# Patient Record
Sex: Male | Born: 1950
Health system: Southern US, Community
[De-identification: ages and names within clinical notes are randomized; demographics above are authoritative.]

## PROBLEM LIST (undated history)

## (undated) DIAGNOSIS — F329 Major depressive disorder, single episode, unspecified: Secondary | ICD-10-CM

## (undated) DIAGNOSIS — I639 Cerebral infarction, unspecified: Secondary | ICD-10-CM

## (undated) DIAGNOSIS — I2510 Atherosclerotic heart disease of native coronary artery without angina pectoris: Secondary | ICD-10-CM

## (undated) DIAGNOSIS — G459 Transient cerebral ischemic attack, unspecified: Secondary | ICD-10-CM

## (undated) DIAGNOSIS — F419 Anxiety disorder, unspecified: Secondary | ICD-10-CM

## (undated) DIAGNOSIS — G35 Multiple sclerosis: Secondary | ICD-10-CM

## (undated) DIAGNOSIS — I6529 Occlusion and stenosis of unspecified carotid artery: Secondary | ICD-10-CM

## (undated) DIAGNOSIS — G500 Trigeminal neuralgia: Secondary | ICD-10-CM

## (undated) DIAGNOSIS — Z8719 Personal history of other diseases of the digestive system: Secondary | ICD-10-CM

## (undated) DIAGNOSIS — E119 Type 2 diabetes mellitus without complications: Secondary | ICD-10-CM

## (undated) DIAGNOSIS — G709 Myoneural disorder, unspecified: Secondary | ICD-10-CM

## (undated) DIAGNOSIS — K219 Gastro-esophageal reflux disease without esophagitis: Secondary | ICD-10-CM

## (undated) DIAGNOSIS — F32A Depression, unspecified: Secondary | ICD-10-CM

## (undated) DIAGNOSIS — I10 Essential (primary) hypertension: Secondary | ICD-10-CM

## (undated) DIAGNOSIS — R51 Headache: Secondary | ICD-10-CM

## (undated) DIAGNOSIS — E785 Hyperlipidemia, unspecified: Secondary | ICD-10-CM

## (undated) DIAGNOSIS — Z9889 Other specified postprocedural states: Secondary | ICD-10-CM

## (undated) HISTORY — DX: Type 2 diabetes mellitus without complications: E11.9

## (undated) HISTORY — DX: Essential (primary) hypertension: I10

## (undated) HISTORY — DX: Multiple sclerosis: G35

## (undated) HISTORY — DX: Occlusion and stenosis of unspecified carotid artery: I65.29

## (undated) HISTORY — DX: Atherosclerotic heart disease of native coronary artery without angina pectoris: I25.10

## (undated) HISTORY — DX: Hyperlipidemia, unspecified: E78.5

## (undated) HISTORY — DX: Other specified postprocedural states: Z98.890

## (undated) HISTORY — DX: Trigeminal neuralgia: G50.0

## (undated) HISTORY — PX: HERNIA REPAIR: SHX51

## (undated) HISTORY — PX: CAROTID ENDARTERECTOMY: SUR193

## (undated) HISTORY — DX: Personal history of other diseases of the digestive system: Z87.19

## (undated) HISTORY — PX: OTHER SURGICAL HISTORY: SHX169

---

## 1980-08-21 HISTORY — PX: OTHER SURGICAL HISTORY: SHX169

## 1987-08-22 HISTORY — PX: VASECTOMY: SHX75

## 1998-12-06 ENCOUNTER — Encounter: Payer: Self-pay | Admitting: Cardiology

## 2003-03-11 ENCOUNTER — Encounter: Payer: Self-pay | Admitting: Cardiology

## 2003-03-12 ENCOUNTER — Inpatient Hospital Stay (HOSPITAL_COMMUNITY): Admission: AD | Admit: 2003-03-12 | Discharge: 2003-03-14 | Payer: Self-pay | Admitting: Internal Medicine

## 2003-03-12 ENCOUNTER — Encounter: Payer: Self-pay | Admitting: Cardiology

## 2003-03-13 ENCOUNTER — Encounter: Payer: Self-pay | Admitting: Internal Medicine

## 2003-03-14 ENCOUNTER — Encounter: Payer: Self-pay | Admitting: *Deleted

## 2003-09-28 ENCOUNTER — Encounter: Payer: Self-pay | Admitting: Cardiology

## 2004-03-29 ENCOUNTER — Encounter: Payer: Self-pay | Admitting: Cardiology

## 2004-09-05 ENCOUNTER — Encounter: Payer: Self-pay | Admitting: Cardiology

## 2006-03-12 ENCOUNTER — Ambulatory Visit: Payer: Self-pay | Admitting: Internal Medicine

## 2006-03-12 ENCOUNTER — Ambulatory Visit (HOSPITAL_COMMUNITY): Admission: RE | Admit: 2006-03-12 | Discharge: 2006-03-12 | Payer: Self-pay | Admitting: Internal Medicine

## 2006-03-12 ENCOUNTER — Encounter (INDEPENDENT_AMBULATORY_CARE_PROVIDER_SITE_OTHER): Payer: Self-pay | Admitting: Specialist

## 2006-03-12 HISTORY — PX: COLONOSCOPY: SHX174

## 2006-04-12 ENCOUNTER — Ambulatory Visit: Payer: Self-pay | Admitting: Internal Medicine

## 2006-04-24 ENCOUNTER — Ambulatory Visit: Payer: Self-pay | Admitting: Internal Medicine

## 2006-04-24 ENCOUNTER — Ambulatory Visit (HOSPITAL_COMMUNITY): Admission: RE | Admit: 2006-04-24 | Discharge: 2006-04-24 | Payer: Self-pay | Admitting: Internal Medicine

## 2006-04-24 ENCOUNTER — Encounter (INDEPENDENT_AMBULATORY_CARE_PROVIDER_SITE_OTHER): Payer: Self-pay | Admitting: *Deleted

## 2007-05-12 ENCOUNTER — Encounter: Payer: Self-pay | Admitting: Cardiology

## 2008-01-30 ENCOUNTER — Ambulatory Visit (HOSPITAL_COMMUNITY): Admission: RE | Admit: 2008-01-30 | Discharge: 2008-01-30 | Payer: Self-pay | Admitting: Urology

## 2009-08-21 HISTORY — PX: HYDROCELE EXCISION: SHX482

## 2010-03-15 ENCOUNTER — Encounter (INDEPENDENT_AMBULATORY_CARE_PROVIDER_SITE_OTHER): Payer: Self-pay | Admitting: *Deleted

## 2010-03-15 ENCOUNTER — Ambulatory Visit: Payer: Self-pay | Admitting: Cardiology

## 2010-03-15 DIAGNOSIS — I10 Essential (primary) hypertension: Secondary | ICD-10-CM | POA: Insufficient documentation

## 2010-03-15 DIAGNOSIS — I2510 Atherosclerotic heart disease of native coronary artery without angina pectoris: Secondary | ICD-10-CM | POA: Insufficient documentation

## 2010-03-15 DIAGNOSIS — G35 Multiple sclerosis: Secondary | ICD-10-CM | POA: Insufficient documentation

## 2010-03-15 DIAGNOSIS — R072 Precordial pain: Secondary | ICD-10-CM | POA: Insufficient documentation

## 2010-03-15 DIAGNOSIS — G459 Transient cerebral ischemic attack, unspecified: Secondary | ICD-10-CM | POA: Insufficient documentation

## 2010-03-23 ENCOUNTER — Ambulatory Visit: Payer: Self-pay | Admitting: Cardiology

## 2010-03-31 ENCOUNTER — Telehealth (INDEPENDENT_AMBULATORY_CARE_PROVIDER_SITE_OTHER): Payer: Self-pay | Admitting: *Deleted

## 2010-07-08 ENCOUNTER — Ambulatory Visit (HOSPITAL_BASED_OUTPATIENT_CLINIC_OR_DEPARTMENT_OTHER): Admission: RE | Admit: 2010-07-08 | Discharge: 2010-07-08 | Payer: Self-pay | Admitting: Urology

## 2010-09-20 NOTE — Assessment & Plan Note (Signed)
Summary: EST - LAST SEEN BEFORE 2007   Visit Type:  Follow-up Primary Provider:  Dhruv Vyas,MD   History of Present Illness: the patient is a 60 year old male with history of coronary artery disease, status post Cypher stent placement to the midright coronary artery and proximal left anterior descending artery. The patient has not had any cardiology follow up in the last 6 years. He has suffered 2 TIAs in the interim and has been placed on Aggrenox. He stated and inability to talk for several minutes during both occasions. Had a neurological workup including carotid Dopplers and CT scans which were negative. He does have multiple sclerosis and is on interferon beta.  From cardiac standpoint he 't denies any symptoms of shortness of breath or chest pain. He reports no palpitations or syncope. He has no orthopnea PND. No stress testing has been done in the last 6 years.  Preventive Screening-Counseling & Management  Alcohol-Tobacco     Smoking Status: quit     Year Quit: 1995  Current Medications (verified): 1)  Betaseron 0.3 Mg Solr (Interferon Beta-1b) .... One Injection Subcutaneously Every Other Night 2)  Aggrenox 25-200 Mg Xr12h-Cap (Aspirin-Dipyridamole) .... Take 1 Tablet By Mouth Two Times A Day 3)  Omeprazole 20 Mg Cpdr (Omeprazole) .... Take 1 Tablet By Mouth Once A Day 4)  Citalopram Hydrobromide 20 Mg Tabs (Citalopram Hydrobromide) .... Take 1 Tablet By Mouth Once A Day 5)  Lorazepam 0.5 Mg Tabs (Lorazepam) .... Take 1 Tablet By Mouth Two Times A Day As Needed 6)  Zetia 10 Mg Tabs (Ezetimibe) .... Take 1 Tablet By Mouth Once A Day 7)  Metoprolol Tartrate 50 Mg Tabs (Metoprolol Tartrate) .... Take 1/2 Tablet By Mouth Two Times A Day 8)  Multivitamins  Tabs (Multiple Vitamin) .... Take 1 Tablet By Mouth Once A Day 9)  Fish Oil 1000 Mg Caps (Omega-3 Fatty Acids) .... Take 1 Tablet By Mouth Two Times A Day  Allergies (verified): No Known Drug  Allergies  Comments:  Nurse/Medical Assistant: The patient's medication bottles and allergies were reviewed with the patient and were updated in the Medication and Allergy Lists.  Past History:  Past Medical History: Last updated: 03/15/2010  MS was diagnosed in 1989.  He was treated with prednisone followed by      betaseron which was gradually tapered because his LFTs went up and it      resolved off therapy.  Likely he is remained in remission for years.  He has hypertension.  Coronary artery disease.  He had two arteries stented four years ago.  He has hyperlipidemia.   He had right inguinal herniorrhaphy. He had colonoscopy on March 12, 2006.  He had two polyps removed; one      was an adenoma and the other one was hyperplastic.  Family History: Last updated: 03/15/2010  Father had lung carcinoma but died of MI at age 48.  Mother  was diabetic, hypertensive and died at 90 of what appears to be necrotizing  pancreatitis.    Social History: Last updated: 03/15/2010 Full Time Married  Works at [employer] - no  Risk Factors: Smoking Status: quit (03/15/2010)  Social History: Smoking Status:  quit  Review of Systems  The patient denies fatigue, malaise, fever, weight gain/loss, vision loss, decreased hearing, hoarseness, chest pain, palpitations, shortness of breath, prolonged cough, wheezing, sleep apnea, coughing up blood, abdominal pain, blood in stool, nausea, vomiting, diarrhea, heartburn, incontinence, blood in urine, muscle weakness, joint  pain, leg swelling, rash, skin lesions, headache, fainting, dizziness, depression, anxiety, enlarged lymph nodes, easy bruising or bleeding, and environmental allergies.    Vital Signs:  Patient profile:   60 year old male Height:      72 inches Weight:      226 pounds BMI:     30.76 Pulse rate:   62 / minute BP sitting:   111 / 72  (left arm) Cuff size:   large  Vitals Entered By: Carlye Grippe (March 15, 2010 1:30 PM)  Nutrition Counseling: Patient's BMI is greater than 25 and therefore counseled on weight management options.   Physical Exam  Additional Exam:  General: Well-developed, well-nourished in no distress head: Normocephalic and atraumatic eyes PERRLA/EOMI intact, conjunctiva and lids normal nose: No deformity or lesions mouth normal dentition, normal posterior pharynx neck: Supple, no JVD.  No masses, thyromegaly or abnormal cervical nodes lungs: Normal breath sounds bilaterally without wheezing.  Normal percussion heart: regular rate and rhythm with normal S1 and S2, no S3 or S4.  PMI is normal.  No pathological murmurs abdomen: Normal bowel sounds, abdomen is soft and nontender without masses, organomegaly or hernias noted.  No hepatosplenomegaly musculoskeletal: Back normal, normal gait muscle strength and tone normal pulsus: Pulse is normal in all 4 extremities Extremities: No peripheral pitting edema neurologic: Alert and oriented x 3 skin: Intact without lesions or rashes cervical nodes: No significant adenopathy psychologic: Normal affect    EKG  Procedure date:  03/15/2010  Findings:      normal sinus rhythm. Normal EKG  Impression & Recommendations:  Problem # 1:  CAD (ICD-414.00) Patient will need a screening stress echocardiogram. This has been ordered. Also needs a lipid panel. His updated medication list for this problem includes:    Aggrenox 25-200 Mg Xr12h-cap (Aspirin-dipyridamole) .Marland Kitchen... Take 1 tablet by mouth two times a day    Metoprolol Tartrate 50 Mg Tabs (Metoprolol tartrate) .Marland Kitchen... Take 1/2 tablet by mouth two times a day  Orders: EKG w/ Interpretation (93000) T-Lipid Profile (24401-02725)  Problem # 2:  ESSENTIAL HYPERTENSION, BENIGN (ICD-401.1) blood pressure is controlled. His updated medication list for this problem includes:    Metoprolol Tartrate 50 Mg Tabs (Metoprolol tartrate) .Marland Kitchen... Take 1/2 tablet by mouth two times a  day  Orders: EKG w/ Interpretation (93000)  Problem # 3:  MULTIPLE SCLEROSIS, RELAPSING/REMITTING (ICD-340) patient is on interferon beta  Problem # 4:  TIA (ICD-435.9) no recurrence. On Aggrenox  Other Orders: Echo- Stress (Stress Echo)  Patient Instructions: 1)  stress echo 2)  lab - fasting lipid panel 3)  Follow up in  1 year

## 2010-09-20 NOTE — Progress Notes (Signed)
Summary: Office Visit  Office Visit   Imported By: Dorise Hiss 03/15/2010 08:33:54  _____________________________________________________________________  External Attachment:    Type:   Image     Comment:   External Document

## 2010-09-20 NOTE — Letter (Signed)
Summary: [facility] D/C DR. VYAS  [facility] D[facility]C DR. VYAS   Imported By: Zachary George 03/15/2010 08:56:11  _____________________________________________________________________  External Attachment:    Type:   Image     Comment:   External Document

## 2010-09-20 NOTE — Progress Notes (Signed)
Summary: TEST RESULTS ON 03/23/10     Phone Note Call from Patient Call back at [phone] CELL   Caller: Spouse Reason for Call: Lab or Test Results Summary of Call: RESULTS OF STRESS ECHO & LABS DONE 03/23/10 Initial call taken by: Dorise Hiss,  March 31, 2010 12:25 PM  Follow-up for Phone Call        Left message to return call.  Hoover Brunette, LPN  March 31, 2010 3:37 PM   Patient notified.   Hoover Brunette, LPN  April 04, 2010 11:31 AM

## 2010-09-20 NOTE — Progress Notes (Signed)
Summary: Office Visit  Office Visit   Imported By: Dorise Hiss 03/15/2010 08:35:10  _____________________________________________________________________  External Attachment:    Type:   Image     Comment:   External Document

## 2010-09-20 NOTE — Consult Note (Signed)
Summary: CARDIOLOGY CONSULT/ [facility]  CARDIOLOGY CONSULT/ [facility]   Imported By: Zachary George 03/15/2010 08:58:14  _____________________________________________________________________  External Attachment:    Type:   Image     Comment:   External Document

## 2010-09-20 NOTE — Letter (Signed)
Summary: Stress Echocardiography  [facility] at [facility] [address]   [place], [place] [address]   Phone: [phone]  Fax: [phone]      [facility] Cardiovascular Services  Stress Echocardiography    [facility]  Appointment Date:_  Appointment Time:_   Your doctor has ordered a stress echo to help determine the condition of your heart during exercise. If you take blood pressure medication, ask your doctor if you should take it the day of your test. You should not have anything to eat or drink at least 4 hours before your test is scheduled.  You will be asked to undress from the waist up and given a hospital gown to wear, so dress comfortably from the waist down for example: Sweat pants, shorts, or skirt Rubber soled lace up shoes (tennis shoes)  You will need to register at the Outpatient/Main Entrance at the hospital 15 minutes before your appointment time. It is a good idea to bring a copy of your order with you. They will direct you to the Cardiovascular Department on the third floor.   Plan on about an hour and a half  from registration to release from the hospital

## 2010-11-01 LAB — POCT I-STAT 4, (NA,K, GLUC, HGB,HCT)
HCT: 48 % (ref 39.0–52.0)
Hemoglobin: 16.3 g/dL (ref 13.0–17.0)
Sodium: 141 mEq/L (ref 135–145)

## 2010-12-14 ENCOUNTER — Other Ambulatory Visit: Payer: Self-pay | Admitting: Cardiology

## 2011-01-06 NOTE — Cardiovascular Report (Signed)
Grant Valdez, Grant Valdez                         ACCOUNT NO.:  [id number]   MEDICAL RECORD NO.:  [id number]                   PATIENT TYPE:  INP   LOCATION:  6533                                 FACILITY:  [facility]   PHYSICIAN:  Salvadore Farber, M.D.             DATE OF BIRTH:  [birth year]   DATE OF PROCEDURE:  03/13/2003  DATE OF DISCHARGE:  03/14/2003                              CARDIAC CATHETERIZATION   PROCEDURES PERFORMED:  1. Coronary angiography.  2. Left heart catheterization.  3. Left ventriculography.  4. Drug eluding stent placement in the mid right coronary artery.  5. Drug eluding stent placement in the proximal left anterior descending.   CARDIOLOGIST:  Salvadore Farber, M.D.   INDICATIONS:  Mr. Dolph is a 60 year old gentleman who presented to  [facility] on March 11, 2003 after suffering the abrupt onset of a  sharp pain in his chest last three or four months, beginning while driving.  He ruled out for a myocardial infarction.  An EKG Cardiolite demonstrated  ischemia and he was transferred for cardiac catheterization.   PROCEDURAL TECHNIQUE:  Informed consent was obtained.  Under 1% lidocaine  local anesthesia a 6 French sheath was placed in the right femoral artery  using the modified Seldinger technique.  Diagnostic angiography and  ventriculography were performed using JL-4, JR-4 and pigtail catheters.  As  detailed below these demonstrated severe stenoses of the proximal LAD and  mid RCA with preserved systolic function.  The case therefore proceeded to  intervention.   Anticoagulation was initiated with heparin and eptifibatide to achieve an  ACT of greater than 200 seconds.  Three-hundred milligrams of Plavix was  administered orally.  The sheath was up sized to a 7 [place] over a wire.  Attention was first turned to the RCA.  A 7 [place] JR-4 guide was advanced  over a wire and engaged at the ostium of the RCA.  A Luge wire was advanced  without  difficulty to the distal RCA.  The severe segment of lesion was  predilated using a 2.5 x 15 mm Maverick balloon at six atmospheres.  Intracoronary nitroglycerin was then administered.  The lesion was then  stented using a 3.0 x 28 mm CYPHER stent deploying it at 16 atmospheres.  The entirety of the stent was then post dilated using a 3.5 x 18 mm  PowerSail at 16 atmospheres.  Intracoronary adenosine was again  administered.  The stent was further post dilated using a 3.75 x 15 mm  PowerSail at 16 atmospheres for a total of three inflations with this  balloon.  Final angiogram demonstrated no residual stenosis, no dissection  and TIMI III flow to the distal vasculature.   Attention was then turned to the LAD.  A 7 [place] CLS-3.5 guide was advanced  over a wire and engaged in the ostium of the LAD.  Intracoronary  nitroglycerin was administered.  A  Luge wire was advanced beyond the lesion  to the distal LAD without difficulty.  The lesion was directly stented using  a 2.75 x 33 mm CYPHER deploying it at 14 atmospheres.  The entirety of the  stent was then post dilated using a 3.0 x 18 mm PowerSail at 178  atmospheres.  Final angiograms demonstrated no residual stenosis, no  dissection and TIMI III flow to the distal vasculature.   The patient tolerated the procedure well and was transferred to the holding  room in stable condition.   COMPLICATIONS:  None.   FINDINGS:  1. LV 152/5/12.  EF greater than 65% without regional wall motion     abnormality.   1. No aortic stenosis.   1. Unable to assess mitral regurgitation due to ventricular ectopy during     ventriculography.   1. Left Main:  Angiographically normal.   1. LAD:  The LAD is a large vessel giving rise to three diagonal branches.     The proximal LAD is diffusely diseased with 60% stenosis proximally and     then an 80% stenosis before the takeoff of the first diagonal.  These two     lesions were successfully treated  using a single drug eluding stent     resulting in no residual stenosis.   1. Circumflex:  Moderate-sized vessel giving rise to a single obtuse     marginal.  It is angiographically normal.   1. RCA:  The RCA is a large dominant vessel.  The mid RCA has 60% stenosis     before the takeoff of an acute marginal and 95% stenosis just after the     takeoff of this marginal.  These two lesions were successfully treated     using a single drug eluding stent resulting in no residual stenosis and     TIMI III flow to the distal vasculature as detailed above.   IMPRESSION AND PLAN:  Successful stenting of both the mid right coronary  artery and proximal left anterior descending using drug eluding stents.   I recommend Plavix for one year given his acute presentation.  Aspirin will  be continued indefinitely.  Eptifibatide will be continued for 18 hours.  Sheaths will be removed with the act is less than 150 seconds.                                                 Salvadore Farber, M.D.    WED/MEDQ  D:  05/08/2003  T:  05/08/2003  Job:  [id number]   cc:   Doreen Beam  [address]  [place]  [place] [address]  Fax: [phone]   Dr. Aundra Millet, [facility]   Learta Codding, M.D.  [address]  [address]  [place]  [place] [address]

## 2011-01-06 NOTE — Op Note (Signed)
Grant Valdez, Grant Valdez               ACCOUNT NO.:  [id number]   MEDICAL RECORD NO.:  [id number]          PATIENT TYPE:  AMB   LOCATION:  DAY                           FACILITY:  [facility]   PHYSICIAN:  Lionel December, M.D.    DATE OF BIRTH:  Oct 27, 1950   DATE OF PROCEDURE:  04/24/2006  DATE OF DISCHARGE:                                 OPERATIVE REPORT   PROCEDURE:  Esophagogastroduodenoscopy with esophageal dilation.   INDICATIONS:  Grant Valdez is a 60 year old Caucasian male with intermittent solid  food dysphagia of one year's duration.  He he denies frequent heartburn.  Procedure was reviewed with the patient, and informed consent was obtained.   MEDICATIONS FOR CONSCIOUS SEDATION:  1. Benzocaine spray for pharyngeal topical anesthesia.  2. Demerol 50 mg IV.  3. Versed 5 mg IV.   FINDINGS:  Procedure performed in endoscopy suite.  The patient's vital  signs and O2 sat were monitored during the procedure and remained stable.  The patient was placed in left lateral position.  Olympus video scope was  passed via oropharynx without difficulty into esophagus.   Esophagus:  Mucosa of the proximal segment is normal.  Mucosa at body  revealed fine mucosal wrinkling.  The distal 4 to 5 cm had linear erosions  down to GE junction where there is a ring.  GE junction was located at 40 cm  and hiatus was a 42.   Stomach was empty and distended very well with insufflation.  Folds of the  proximal stomach were normal.  Examination of mucosa revealed antral  erythema and two small prepyloric erosions.  Pyloric channel was patent.  Angularis, fundus and cardia were examined by retroflexing the scope and  were normal.   Duodenum:  Mucosa of the bulb revealed a few punctate erosions and erythema.  Scope was passed to second part of duodenum where mucosa and folds were  normal.  Endoscope was withdrawn.   Esophagus was dilated by passing 52- and 54-French Maloney dilators to full  insertion.  As  dilation was complete, the endoscope was passed again and the  ring was noted to have been disrupted in two places.  Pictures taken for  record.  Biopsy was taken from mucosa at body away from these erosions,  looking for eosinophilic esophagitis.  Endoscope was withdrawn.  The patient  tolerated the procedure well.   FINAL DIAGNOSES:  Multiple findings.  1. Erosive reflux esophagitis with ring at gastroesophageal junction.  2. Small sliding hiatal hernia.  3. Mucosal wrinkling at body suggestive of eosinophilic esophagitis.  4. Erosive antral gastritis and bulbar duodenitis.  5. Esophagus dilated by passing 52- and 54-French Maloney dilators,      disrupting this ring.   RECOMMENDATIONS:  1. Antireflux measures.  2. Omeprazole 20 mg p.o. q.a.m., prescription given for 30 with 11      refills.  3. We will check his H. pylori serology today.  4. I will be contacting the patient with results of these studies.  5. The patient will resume his Aggrenox today.      Lionel December, M.D.  Electronically Signed     NR/MEDQ  D:  04/24/2006  T:  04/24/2006  Job:  [id number]   cc:   Doreen Beam  Fax: [phone]

## 2011-01-06 NOTE — Discharge Summary (Signed)
NAMECARLOS, Valdez                         ACCOUNT NO.:  [id number]   MEDICAL RECORD NO.:  [id number]                   PATIENT TYPE:  INP   LOCATION:  6533                                 FACILITY:  [facility]   PHYSICIAN:  Learta Codding, M.D.                 DATE OF BIRTH:  [birth year]   DATE OF ADMISSION:  03/12/2003  DATE OF DISCHARGE:  03/14/2003                           DISCHARGE SUMMARY - REFERRING   HISTORY OF PRESENT ILLNESS:  This is a 60 year old male who was admitted to  [facility] on March 11, 2003, to evaluate for chest pain. The patient  states he was driving down the road and developed a sharp pain in his chest.  He never had similar symptoms prior to this. The pain was substernal,  described as sharp and a deep stabbing pain that was intermittent, over the  course of 3-4 minutes. There was no shortness of breath, no radiation, no  diaphoresis, no nausea. The pain was a 4 on a 1-10 scale. He saw Dr. Sherril Croon  and was admitted for observation. He has had no pain since that time.   The patient was seen in consultation by Dr. Andee Lineman on March 12, 2003, and a  decision was made to proceed with an exercise Cardiolite. The exercise  Cardiolite was mildly abnormal and a decision was made to transfer the  patient to [facility] for cardiac catheterization.   PAST MEDICAL HISTORY:  The patient has a history of multiple sclerosis  diagnosed in 1989. He has had abnormal liver function tests and recently his  Betaseron was discontinued. He has a history of a hernia repair. He has a  history of elevated cholesterol levels. He has a remote tobacco history. He  quit smoking in 1995. He smoked one pack per day for about 20 years.   ALLERGIES:  No known drug allergies.   ADMISSION MEDICATIONS:  1. Celexa.  2. Betaseron.  3. Cylert. These were all recently stopped secondary to liver problems.  4. Zantac 150 mg b.i.d.  5. Aspirin 325 mg daily in the hospital.   SOCIAL  HISTORY:  The patient lives in [place]. He is married and he has two  children. He has a remote tobacco history as noted. He uses alcohol rarely.  He works as a Marine scientist.   FAMILY HISTORY:  The patient's father died at age 78 from myocardial  infarction. He has a brother who is alive and well. His mother died at age  22 from diabetes.   HOSPITAL COURSE:  As noted, this patient was transferred to [facility] on March 12, 2003, to undergo cardiac catheterization after he had  an abnormal exercise Cardiolite performed at [facility]. The patient  underwent cardiac catheterization on March 13, 2003. He was found to have a  mid RCA lesion of approximately 60% as well as a 95% RCA lesion. Both  of  these were stented, reducing the stenosis to 0. He had an 80% LAD lesion  along with a 60% LAD lesion. These were both stented to 0. The ejection  fraction was estimated to be 65% without regional wall motion abnormalities.   The patient was to be discharged on March 14, 2003. However, his admission  chest x-ray showed a questionable pulmonary nodule. A decision was made to  perform a CT scan on the day of discharge. The preliminary report showed no  nodule. The final official report is still pending. However, as noted the  preliminary report showed no significant nodule.   DISPOSITION:  The patient was discharged in improved and stable condition.   LABORATORY DATA:  CBC on the day of discharge revealed hemoglobin 15.7,  hematocrit 44.8, WBC 7.3, platelets 120,000. Chemistry profile revealed BUN  10, creatinine 1.0, potassium 3.9. A lipid panel was pending. CK-MB enzymes  were negative.   A chest x-ray showed a pulmonary nodule. A followup CT scan showed no  nodule.   DISCHARGE MEDICATIONS:  1. Plavix 75 mg daily x1 year.  2. Enteric-coated aspirin 325 mg daily.  3. Zantac 150 mg b.i.d.  4. Lopressor 25 mg b.i.d.  5. Lipitor 10 mg each evening.  6. Nitroglycerin 0.4 mg  sublingual as needed.   ACTIVITY:  The patient was told to avoid any strenuous activity for at least  two days.   DIET:  Low-salt, low-fat diet.   WOUND CARE:  He was told to call the office if he had any problems with his  catheterization site.   DISCHARGE INSTRUCTIONS:  He was to have a liver function profile and lipids  performed in 3-4 weeks.   FOLLOWUP:  He was to call Dr. Sherril Croon for an appointment. He was to call the  heart center in [facility] for a followup appointment.   DISCHARGE DIAGNOSES:  1. Coronary artery disease with percutaneous transluminal coronary     angioplasty stenting of the right coronary artery and the left anterior     descending artery performed July 14, 2003, as described above.  2. Ejection fraction 65%.  3. History of dyslipidemia, statin started this admission.  4. Elevated liver function tests, mild.  5. History of multiple sclerosis.  6. Remote tobacco history.  7.     Positive family history of coronary artery disease.  8. Nodule seen on chest x-ray with negative CT scan of the chest.   ADDENDUM:  At [facility], his SGOT had been 43, SGPT was 54.      Chauncey Reading, P.A.-C  [facility]           Learta Codding, M.D.    DLR/MEDQ  D:  03/14/2003  T:  03/14/2003  Job:  [id number]   cc:   Angelina Pih., M.D. Sherril Croon

## 2011-01-06 NOTE — Op Note (Signed)
NAMEJAIME, GRIZZELL               ACCOUNT NO.:  [id number]   MEDICAL RECORD NO.:  [id number]          PATIENT TYPE:  AMB   LOCATION:  DAY                           FACILITY:  [facility]   PHYSICIAN:  Lionel December, M.D.    DATE OF BIRTH:  [birth year]   DATE OF PROCEDURE:  03/12/2006  DATE OF DISCHARGE:                                 OPERATIVE REPORT   PROCEDURE:  Colonoscopy with polypectomy.   INDICATIONS:  Grant Valdez is a 60 year old Caucasian male who is undergoing  average-risk screening colonoscopy.  Procedure and risks were reviewed the  patient, and informed consent was obtained.   MEDICATIONS FOR CONSCIOUS SEDATION:  Demerol 50 mg IV, Versed 6 mg IV.   FINDINGS:  Procedure performed in endoscopy suite.  The patient's vital  signs and O2 saturation were monitored during the procedure remained stable.  The patient was placed in left lateral position, and rectal examination  performed.  No abnormality noted on external or digital exam.  Olympus  videoscope was placed in rectum and advanced under vision into sigmoid colon  and beyond.  Preparation was excellent.  Scope was passed into cecum which  was identified by appendiceal orifice and ileocecal valve.  Pictures were  taken for the record.  Short segment of TI was also examined and was normal.  As the scope was withdrawn, colonic mucosa was once again carefully  examined.  There was a 6-mm polyp at distal sigmoid colon which was snared  and retrieved for histologic examination.  Another 7-mm polyp in the  rectosigmoid junction just sessile.  This was also snared and retrieved for  histologic examination.  There was excellent hemostasis at both polypectomy  sites.  Rectal mucosa was normal.  Scope was retroflexed to examine  anorectal junction which was unremarkable.  Endoscope was straightened and  withdrawn.  The patient tolerated the procedure well.   FINAL DIAGNOSIS:  1.  Examination performed ti cecum.  2.  Two polyps snared,  6-mm polyp from distal sigmoid colon and 7-mm polyp      from rectosigmoid junction.   RECOMMENDATIONS:  Standard instructions given.  He will resume his Aggrenox  on March 14, 2006.  I will be contacting the patient with the results of  biopsy and further recommendations.      Lionel December, M.D.  Electronically Signed    NR/MEDQ  D:  03/12/2006  T:  03/12/2006  Job:  [id number]   cc:   Doreen Beam  Fax: [phone]

## 2011-01-06 NOTE — H&P (Signed)
Grant Valdez, Grant Valdez               ACCOUNT NO.:  [id number]   MEDICAL RECORD NO.:  [id number]          PATIENT TYPE:  AMB   LOCATION:  DAY                           FACILITY:  [facility]   PHYSICIAN:  Lionel December, M.D.    DATE OF BIRTH:  08-14-1951   DATE OF ADMISSION:  DATE OF DISCHARGE:  LH                                HISTORY & PHYSICAL   PRESENTING COMPLAINT:  Dysphagia to solids.   HISTORY OF PRESENT ILLNESS:  Grant Valdez is a 60 year old Caucasian male patient  of Dr. Sherril Croon who is here for a scheduled visit.  He underwent screening  colonoscopy last month.  At that time, he forgot to tell me that he has been  experience dysphagia until he was ready to leave the hospital.  He states he  has had dysphagia for about a year.  It is primarily to meats and steak.  Most of the time, he is able to push the food bolus by waiting or drinking  water.  At Other times he has to regurgitate in order to get relief.  He has  no problems with pills or liquids.  He has a good appetite.  He denies  nausea, vomiting, throat symptoms, abdominal pain, melena or rectal  bleeding.  He says the only time he experienced heartburn was when he was on  prednisone back in 1989 or 1990 when he was diagnosed with MS.   MEDICATIONS:  1. He is on Aggrenox b.i.d.  2. Lopressor 25 mg b.i.d.  3. Zetia 10 mg every day.  4. Lorazepam 0.5 mg every day.  5. Fish oil 1-2 grams b.i.d.  6. MVI every day.   PAST MEDICAL HISTORY:  1. MS was diagnosed in 1989.  He was treated with prednisone followed by      betaseron which was gradually tapered because his LFTs went up and it      resolved off therapy.  Likely he is remained in remission for years.  2. He has hypertension.  3. Coronary artery disease.  He had two arteries stented four years ago.  4. He is under care of Dr. Andee Lineman of [facility].  5. He has hyperlipidemia.  6. He had right inguinal herniorrhaphy.  7. He had colonoscopy on March 12, 2006.  He had two  polyps removed; one      was an adenoma and the other one was hyperplastic.   ALLERGIES:  NKDA.   FAMILY HISTORY:  Father had lung carcinoma but died of MI at age 46.  Mother  was diabetic, hypertensive and died at 38 of what appears to be necrotizing  pancreatitis.  She has one brother in good health.   SOCIAL HISTORY:  He is married.  He has been a Estate agent for 21  years.  He drinks alcohol occasionally.  He smoked 2 packs a day for 15  years but quit in 1995.  [place] is [place]  He has two children.   OBJECTIVE:  VITAL SIGNS:  Weight 205 pounds.  He is 6 feet  tall.  Pulse 72  per minute, blood pressure 142/86, temperature is 97.7.  HEENT:  Conjunctivae is pink.  Sclerae is nonicteric.  Oropharyngeal mucosa  is normal.  Dentition in satisfactory condition.  NECK:  No neck masses or thyromegaly noted.  CARDIAC:  Exam with regular rhythm.  Normal S1-S3.  No murmur or gallop  noted.  LUNGS:  Clear to auscultation.  ABDOMEN:  Abdomen is protuberant but soft and nontender without organomegaly  or masses.  EXTREMITIES:  No clubbing or edema noted.   ASSESSMENT/PLAN:  Grant Valdez is a 60 year old Caucasian male who has  intermittent dysphagia, primarily to solids and particularly to meats and  steak.  I suspect he has not Schatzki's ring.  This should be amenable to  endoscopic therapy.   PLAN:  1. Esophagogastroduodenoscopy to be performed in the near future.  The      patient would hold off his Aggrenox for 2 days prior to the procedure      and he should be able to go back on it on the day after the procedure.   I have reviewed the procedures with the patient.  He is agreeable.      Lionel December, M.D.  Electronically Signed     NR/MEDQ  D:  04/12/2006  T:  04/12/2006  Job:  [id number]   cc:   Doreen Beam  Fax: [phone]

## 2011-02-24 ENCOUNTER — Encounter: Payer: Self-pay | Admitting: Cardiology

## 2011-03-03 ENCOUNTER — Encounter (INDEPENDENT_AMBULATORY_CARE_PROVIDER_SITE_OTHER): Payer: Self-pay | Admitting: Internal Medicine

## 2012-02-28 ENCOUNTER — Encounter (INDEPENDENT_AMBULATORY_CARE_PROVIDER_SITE_OTHER): Payer: Self-pay | Admitting: *Deleted

## 2012-03-21 ENCOUNTER — Other Ambulatory Visit (HOSPITAL_COMMUNITY): Payer: Self-pay | Admitting: Diagnostic Neuroimaging

## 2012-03-21 DIAGNOSIS — G35 Multiple sclerosis: Secondary | ICD-10-CM

## 2012-03-21 DIAGNOSIS — R200 Anesthesia of skin: Secondary | ICD-10-CM

## 2012-03-21 DIAGNOSIS — G459 Transient cerebral ischemic attack, unspecified: Secondary | ICD-10-CM

## 2012-04-02 ENCOUNTER — Ambulatory Visit (HOSPITAL_COMMUNITY): Payer: Self-pay

## 2012-04-02 ENCOUNTER — Ambulatory Visit (HOSPITAL_COMMUNITY)
Admission: RE | Admit: 2012-04-02 | Discharge: 2012-04-02 | Disposition: A | Discharge status: Home / Self Care | Payer: Managed Care, Other (non HMO) | Source: Ambulatory Visit | Attending: Diagnostic Neuroimaging | Admitting: Diagnostic Neuroimaging

## 2012-04-02 DIAGNOSIS — R200 Anesthesia of skin: Secondary | ICD-10-CM

## 2012-04-02 DIAGNOSIS — R209 Unspecified disturbances of skin sensation: Secondary | ICD-10-CM | POA: Insufficient documentation

## 2012-04-02 DIAGNOSIS — G459 Transient cerebral ischemic attack, unspecified: Secondary | ICD-10-CM

## 2012-04-02 DIAGNOSIS — G35 Multiple sclerosis: Secondary | ICD-10-CM

## 2012-04-02 LAB — CREATININE, SERUM
GFR calc Af Amer: >90 mL/min (ref >90)
GFR calc non Af Amer: >90 mL/min (ref >90)

## 2012-04-02 LAB — BUN: BUN: 11 mg/dL (ref 6–23)

## 2012-04-02 MED ORDER — GADOBENATE DIMEGLUMINE 529 MG/ML IV SOLN
20.0000 mL | Freq: Once | INTRAVENOUS | Status: AC
  Administered 2012-04-02: 20 mL via INTRAVENOUS

## 2012-04-26 ENCOUNTER — Emergency Department (HOSPITAL_COMMUNITY)
Admission: EM | Admit: 2012-04-26 | Discharge: 2012-04-26 | Disposition: A | Discharge status: Home / Self Care | Payer: 59 | Attending: Emergency Medicine | Admitting: Emergency Medicine

## 2012-04-26 ENCOUNTER — Encounter (HOSPITAL_COMMUNITY): Payer: Self-pay | Admitting: *Deleted

## 2012-04-26 ENCOUNTER — Emergency Department (HOSPITAL_COMMUNITY): Payer: 59

## 2012-04-26 DIAGNOSIS — S0990XA Unspecified injury of head, initial encounter: Secondary | ICD-10-CM | POA: Insufficient documentation

## 2012-04-26 DIAGNOSIS — G35 Multiple sclerosis: Secondary | ICD-10-CM | POA: Insufficient documentation

## 2012-04-26 DIAGNOSIS — R51 Headache: Secondary | ICD-10-CM | POA: Insufficient documentation

## 2012-04-26 DIAGNOSIS — I2510 Atherosclerotic heart disease of native coronary artery without angina pectoris: Secondary | ICD-10-CM | POA: Insufficient documentation

## 2012-04-26 DIAGNOSIS — I10 Essential (primary) hypertension: Secondary | ICD-10-CM | POA: Insufficient documentation

## 2012-04-26 DIAGNOSIS — W1789XA Other fall from one level to another, initial encounter: Secondary | ICD-10-CM | POA: Insufficient documentation

## 2012-04-26 DIAGNOSIS — E785 Hyperlipidemia, unspecified: Secondary | ICD-10-CM | POA: Insufficient documentation

## 2012-04-26 DIAGNOSIS — S20229A Contusion of unspecified back wall of thorax, initial encounter: Secondary | ICD-10-CM | POA: Insufficient documentation

## 2012-04-26 DIAGNOSIS — Z79899 Other long term (current) drug therapy: Secondary | ICD-10-CM | POA: Insufficient documentation

## 2012-04-26 HISTORY — DX: Transient cerebral ischemic attack, unspecified: G45.9

## 2012-04-26 MED ORDER — HYDROMORPHONE HCL PF 1 MG/ML IJ SOLN
1.0000 mg | Freq: Once | INTRAMUSCULAR | Status: AC
  Administered 2012-04-26: 1 mg via INTRAMUSCULAR
  Filled 2012-04-26: qty 1

## 2012-04-26 MED ORDER — HYDROCODONE-ACETAMINOPHEN 5-325 MG PO TABS: 1.0000 | ORAL_TABLET | Freq: Four times a day (QID) | ORAL | Status: AC | PRN

## 2012-04-26 NOTE — ED Notes (Signed)
Abrasions noted on pts right elbow and lower right back.

## 2012-04-26 NOTE — ED Notes (Signed)
Pt states he fell from a "hay trailer". Pt states he hit his back on the tongue of the trailer and head on the bumper of the truck.

## 2012-04-26 NOTE — ED Provider Notes (Signed)
History   This chart was scribed for Grant Lennert, MD by Toya Smothers. The patient was seen in room APA16A/APA16A. Patient's care was started at 2104.  CSN: [id number]  Arrival date & time 04/26/12  2104   First MD Initiated Contact with Patient 04/26/12 2209      Chief Complaint  Patient presents with  . Fall  . Back Pain  . Headache   Patient is a 61 y.o. male presenting with fall, back pain, headaches, and head injury. The history is provided by the patient.  Fall The accident occurred 1 to 2 hours ago. Incident: Pt fell from a hay trailer. He fell from a height of 3 to 5 ft. Impact surface: Metal Surface. There was no blood loss. Point of impact: Lower lumbar. Pain location: Lower lumbar. The pain is at a severity of 10/10. The pain is moderate. He was ambulatory at the scene. There was no entrapment after the fall. There was no drug use involved in the accident. There was no alcohol use involved in the accident. Pertinent negatives include no fever, no numbness, no abdominal pain, no nausea, no vomiting, no hematuria, no headaches and no loss of consciousness. The symptoms are aggravated by sitting and pressure on the injury. He has tried nothing for the symptoms. The treatment provided no relief.  Back Pain  This is a new problem. The current episode started 1 to 2 hours ago. The problem occurs constantly. The problem has not changed since onset.The pain is associated with falling. The pain is present in the lumbar spine. The quality of the pain is described as stabbing and aching. The pain is at a severity of 10/10. The pain is moderate. The symptoms are aggravated by twisting and certain positions. Pertinent negatives include no chest pain, no fever, no numbness, no headaches, no abdominal pain and no weakness. He has tried nothing for the symptoms. The treatment provided no relief.  Headache  Pertinent negatives include no fever, no nausea and no vomiting.  Head Injury  The incident  occurred 1 to 2 hours ago. He came to the ER via walk-in. The injury mechanism was a fall. There was no loss of consciousness. There was no blood loss. The patient is experiencing no pain. The pain has been improving since the injury. Pertinent negatives include no numbness, no blurred vision, no vomiting, no tinnitus, patient does not experience disorientation, no weakness and no memory loss. He has tried nothing for the symptoms. The treatment provided no relief.   Grant Valdez is a 61 y.o. male who presents to the Emergency Department complaining of 2 hours of back pain as the result of injury during fall. Pt reports that he feel 4 feet while standing on the top of a hay trailer landing on the metal bed of his truck. Pt landed on his lower lumbar region and also sustained injury to the right temporal area of his forehead. Pt denies LOC. Pain is rated 10/10 and is aggravated by movement and sitting. He has not treated pain prior to arrival at the ED. Pt denies neck pain, chest pain, emesis, nausea, rash, and cough.  Pt lists PCP as Dr.   Past Medical History  Diagnosis Date  . MS (multiple sclerosis)     diagnosed in 1989. He was treated with prednisone followed by betaseron which was gradually tapered because his LFTs went up and it resolved off therapy. Likely he is remained in remission for years  . HTN (hypertension)   .  CAD (coronary artery disease)     had 2 arteries tented four years ago  . HLD (hyperlipidemia)   . S/P right inguinal herniorrhaphy   . TIA (transient ischemic attack)     Past Surgical History  Procedure Date  . Colonoscopy March 12, 2006    Had 2 polyps removed; one was an adenoma and the other one was hyperplastic  . Stents     Family History  Problem Relation Age of Onset  . Diabetes Mother   . Hypertension Mother   . Heart attack Father     History  Substance Use Topics  . Smoking status: Never Smoker   . Smokeless tobacco: Not on file  . Alcohol  Use: No    Review of Systems  Constitutional: Negative for fever and fatigue.  HENT: Negative for congestion, sinus pressure, tinnitus and ear discharge.        Head injury  Eyes: Negative for blurred vision and discharge.  Respiratory: Negative for cough.   Cardiovascular: Negative for chest pain.  Gastrointestinal: Negative for nausea, vomiting, abdominal pain and diarrhea.  Genitourinary: Negative for frequency and hematuria.  Musculoskeletal: Positive for back pain.  Skin: Negative for rash.  Neurological: Negative for seizures, loss of consciousness, weakness, light-headedness, numbness and headaches.  Hematological: Negative.   Psychiatric/Behavioral: Negative for hallucinations and memory loss.    Allergies  Review of patient's allergies indicates not on file.  Home Medications   Current Outpatient Rx  Name Route Sig Dispense Refill  . CITALOPRAM HYDROBROMIDE 20 MG PO TABS Oral Take 20 mg by mouth daily.      . ASPIRIN-DIPYRIDAMOLE ER 25-200 MG PO CP12 Oral Take 1 capsule by mouth 2 (two) times daily.      Marland Kitchen EZETIMIBE 10 MG PO TABS Oral Take 10 mg by mouth daily.      . INTERFERON BETA-1B 0.3 MG [place] SOLR Subcutaneous Inject 0.25 mg into the skin every other day.      Marland Kitchen LORAZEPAM 0.5 MG PO TABS Oral Take 0.5 mg by mouth 2 (two) times daily as needed.      Marland Kitchen METOPROLOL TARTRATE 50 MG PO TABS Oral Take 50 mg by mouth 2 (two) times daily. Take 1/2 tab     . MULTIVITAL PO TABS Oral Take 1 tablet by mouth daily.      Marland Kitchen FISH OIL 1000 MG PO CAPS Oral Take by mouth 2 (two) times daily.      Marland Kitchen OMEPRAZOLE 20 MG PO CPDR Oral Take 20 mg by mouth daily.      Marland Kitchen PRAVASTATIN SODIUM 20 MG PO TABS  TAKE 1 TABLET BY MOUTH AT BEDTIME 30 tablet 6    BP 157/82  Pulse 75  Temp 97.8 F (36.6 C) (Oral)  Resp 20  Ht 6' (1.829 m)  Wt 230 lb (104.327 kg)  BMI 31.19 kg/m2  SpO2 99%  Physical Exam  Nursing note and vitals reviewed. Constitutional: He appears well-developed and  well-nourished.  HENT:  Head: Normocephalic and atraumatic.  Right Ear: External ear normal.  Left Ear: External ear normal.  Nose: Nose normal.       Bruise to right temperal area.  Eyes: Conjunctivae and EOM are normal.  Neck: Neck supple. No tracheal deviation present.  Pulmonary/Chest: Effort normal. No stridor. No respiratory distress.  Musculoskeletal: He exhibits no edema.       Lumbar back: He exhibits decreased range of motion, tenderness, pain and spasm. He exhibits no swelling and no edema.  Right lower lumbar tenderness.  Neurological: He is alert. He is not disoriented. No cranial nerve deficit or sensory deficit. He exhibits normal muscle tone. Coordination normal.  Reflex Scores:      Patellar reflexes are 2+ on the right side and 2+ on the left side.      Achilles reflexes are 2+ on the right side and 2+ on the left side. Skin: Skin is warm and dry. No rash noted. He is not diaphoretic. No erythema.  Psychiatric: He has a normal mood and affect. His behavior is normal. Thought content normal.    ED Course  Procedures (including critical care time) DIAGNOSTIC STUDIES: Oxygen Saturation is 99% on room air, normal by my interpretation.    COORDINATION OF CARE: 22:13- Evaluated Pt. Pt is awake, alert, and oriented.   Labs Reviewed - No data to display No results found.   No diagnosis found.    MDM      The chart was scribed for me under my direct supervision.  I personally performed the history, physical, and medical decision making and all procedures in the evaluation of this patient.Grant Lennert, MD 04/26/12 [phone]

## 2012-04-26 NOTE — ED Notes (Signed)
Pt left ambulatory with wife. Pt denies pain when standing or sitting, but states increased pain (7-8 out of 10) when moving to the left. Pt left with discharge instructions and prescriptions. Pt verbalized understanding of discharge instructions and medications.

## 2012-05-13 ENCOUNTER — Other Ambulatory Visit: Payer: Self-pay

## 2012-05-13 DIAGNOSIS — I6529 Occlusion and stenosis of unspecified carotid artery: Secondary | ICD-10-CM

## 2012-05-13 DIAGNOSIS — G459 Transient cerebral ischemic attack, unspecified: Secondary | ICD-10-CM

## 2012-05-20 ENCOUNTER — Encounter: Payer: Self-pay | Admitting: Vascular Surgery

## 2012-05-21 ENCOUNTER — Ambulatory Visit (INDEPENDENT_AMBULATORY_CARE_PROVIDER_SITE_OTHER): Payer: 59 | Admitting: Vascular Surgery

## 2012-05-21 ENCOUNTER — Encounter: Payer: Self-pay | Admitting: Vascular Surgery

## 2012-05-21 ENCOUNTER — Encounter: Payer: Self-pay | Admitting: *Deleted

## 2012-05-21 ENCOUNTER — Other Ambulatory Visit: Payer: Self-pay | Admitting: *Deleted

## 2012-05-21 VITALS — BP 136/81 | HR 64 | Resp 18 | Ht 72.0 in | Wt 234.6 lb

## 2012-05-21 DIAGNOSIS — I6529 Occlusion and stenosis of unspecified carotid artery: Secondary | ICD-10-CM

## 2012-05-21 DIAGNOSIS — Z01810 Encounter for preprocedural cardiovascular examination: Secondary | ICD-10-CM

## 2012-05-21 DIAGNOSIS — G459 Transient cerebral ischemic attack, unspecified: Secondary | ICD-10-CM

## 2012-05-21 NOTE — Progress Notes (Signed)
[facility] [place]   Patient name: Grant Valdez MRN: [id number] DOB: 1951-08-10 Sex: male   Referred by: [facility]  Reason for referral:  Chief Complaint  Patient presents with  . New Evaluation  . Carotid    HISTORY OF PRESENT ILLNESS: The patient presents today for evaluation of right carotid stenosis. He is a very complex past history. He does have a history of multiple sclerosis diagnosed in 1989. He has had progressive symptoms of generalized weakness and numbness. He has had what he describes as 3 separate instances where he has had a focal numbness and weakness in the left side of his face. These risk cover spontaneously after several minutes. He denies any left or right arm or leg focal weakness other than his generalized weakness from MS. He does have history of prior prior coronary disease with coronary stents x2. He does occasionally report "floaters" from his visual fields but no true amaurosis.  Past Medical History  Diagnosis Date  . MS (multiple sclerosis)     diagnosed in 1989. He was treated with prednisone followed by betaseron which was gradually tapered because his LFTs went up and it resolved off therapy. Likely he is remained in remission for years  . HTN (hypertension)   . CAD (coronary artery disease)     had 2 arteries tented four years ago  . HLD (hyperlipidemia)   . S/P right inguinal herniorrhaphy   . TIA (transient ischemic attack)     Past Surgical History  Procedure Date  . Colonoscopy March 12, 2006    Had 2 polyps removed; one was an adenoma and the other one was hyperplastic  . Stents   . Varicose removal from scrotum 1982  . Hydrocele excision 2011  . Vasectomy 1989  . Hernia repair age 7    History   Social History  . Marital Status: Married    Spouse Name: N/A    Number of Children: N/A  . Years of Education: N/A   Occupational History  . Not on file.   Social History Main Topics  . Smoking status:  Former Smoker    Quit date: 08/21/1993  . Smokeless tobacco: Not on file  . Alcohol Use: No  . Drug Use: No  . Sexually Active:    Other Topics Concern  . Not on file   Social History Narrative   Full time. Married. Works at [employer].     Family History  Problem Relation Age of Onset  . Diabetes Mother   . Hypertension Mother   . Heart attack Father     Allergies as of 05/21/2012  . (No Known Allergies)    Current Outpatient Prescriptions on File Prior to Visit  Medication Sig Dispense Refill  . citalopram (CELEXA) 20 MG tablet Take 20 mg by mouth daily.        Marland Kitchen dipyridamole-aspirin (AGGRENOX) 25-200 MG per 12 hr capsule Take 1 capsule by mouth 2 (two) times daily.        Marland Kitchen ezetimibe (ZETIA) 10 MG tablet Take 10 mg by mouth daily.        Marland Kitchen LORazepam (ATIVAN) 0.5 MG tablet Take 0.5 mg by mouth 2 (two) times daily as needed. nerves      . metoprolol (LOPRESSOR) 50 MG tablet Take 25 mg by mouth 2 (two) times daily.       . Multiple Vitamins-Minerals (MULTIVITAL) tablet Take 1 tablet by mouth daily.        Marland Kitchen  Omega-3 Fatty Acids (FISH OIL) 1000 MG CAPS Take by mouth 2 (two) times daily.        Marland Kitchen omeprazole (PRILOSEC) 20 MG capsule Take 20 mg by mouth daily.           REVIEW OF SYSTEMS:  Positives indicated with an "X"  CARDIOVASCULAR:  [x ] chest pain   [ ]  chest pressure   [ ]  palpitations   [ ]  orthopnea   [x ] dyspnea on exertion   [ ]  claudication   [ ]  rest pain   [ ]  DVT   [ ]  phlebitis PULMONARY:   [ ]  productive cough   [ ]  asthma   [ ]  wheezing NEUROLOGIC:   [x ] weakness  [x ] paresthesias  [x ] aphasia  [ ]  amaurosis  [ x] dizziness HEMATOLOGIC:   [ ]  bleeding problems   [ ]  clotting disorders MUSCULOSKELETAL:  [ ]  joint pain   [ ]  joint swelling GASTROINTESTINAL: [ ]   blood in stool  [ ]   hematemesis GENITOURINARY:  [ ]   dysuria  [ ]   hematuria PSYCHIATRIC:  [ ]  history of major depression INTEGUMENTARY:  [ ]  rashes  [ ]  ulcers CONSTITUTIONAL:  [ ]   fever   [ ]  chills  PHYSICAL EXAMINATION:  General: The patient is a well-nourished male, in no acute distress. Vital signs are BP 136/81  Pulse 64  Resp 18  Ht 6' (1.829 m)  Wt 234 lb 9.6 oz (106.414 kg)  BMI 31.82 kg/m2 Pulmonary: There is a good air exchange bilaterally without wheezing or rales. Abdomen: Soft and non-tender with normal pitch bowel sounds. Musculoskeletal: There are no major deformities.  There is no significant extremity pain. Neurologic: No focal weakness or paresthesias are detected, Skin: There are no ulcer or rashes noted. Psychiatric: The patient has normal affect. Cardiovascular: There is a regular rate and rhythm without significant murmur appreciated. Carotid arteries without bruits bilaterally 2+ radial pulses bilaterally  [facility] Vascular Lab Studies:  Ordered and Independently Reviewed carotid duplex in our office today reveals right internal carotid artery stenosis in the 80-99% range with no significant stenosis in the left carotid system. He does have a high bifurcation and the internal carotid extends up past the bifurcation.  I have compared this with his outside study which have also reviewed and have reviewed the records that were supplied with him. This showed a lesser degree of stenosis predicted on the right in the 60% range.  Impression and Plan:  Right internal carotid artery stenosis with possible right brain TIA event. I explained that we proceed with endarterectomy based on duplex on approximately 90% of cases. I did explain that his high bifurcation and extension of plaque in his internal carotid above the level of visualization with ultrasound needs further imaging is warranted. He is scheduled for a CT angiogram for further evaluation of both degree of narrowing and also to assure that his internal carotid becomes normal again above the bifurcation. Assuming that he does have a normal internal and is accessible we would recommend  endarterectomy based on probable right brain event. I discussed the procedure with the expected one night hospitalization and the 1-2% risk of stroke with surgery. I. he and his wife understand and wish to proceed. We have tentatively scheduled the surgery for 06/10/2012 pending his CT angiogram    Adalbert Alberto [facility] [place] Office: [phone]

## 2012-05-21 NOTE — Progress Notes (Signed)
Carotid duplex performed @ [facility] 05/21/2012 

## 2012-05-27 ENCOUNTER — Encounter (HOSPITAL_COMMUNITY): Payer: Self-pay | Admitting: Pharmacy Technician

## 2012-05-27 DIAGNOSIS — I6529 Occlusion and stenosis of unspecified carotid artery: Secondary | ICD-10-CM

## 2012-05-28 ENCOUNTER — Encounter (HOSPITAL_COMMUNITY)
Admission: RE | Admit: 2012-05-28 | Discharge: 2012-05-28 | Disposition: A | Discharge status: Home / Self Care | Payer: 59 | Source: Ambulatory Visit | Attending: Vascular Surgery | Admitting: Vascular Surgery

## 2012-05-28 ENCOUNTER — Encounter (HOSPITAL_COMMUNITY): Payer: Self-pay

## 2012-05-28 ENCOUNTER — Ambulatory Visit (HOSPITAL_COMMUNITY)
Admission: RE | Admit: 2012-05-28 | Discharge: 2012-05-28 | Disposition: A | Discharge status: Home / Self Care | Payer: 59 | Source: Ambulatory Visit | Attending: Vascular Surgery | Admitting: Vascular Surgery

## 2012-05-28 DIAGNOSIS — Z01818 Encounter for other preprocedural examination: Secondary | ICD-10-CM | POA: Insufficient documentation

## 2012-05-28 DIAGNOSIS — I6529 Occlusion and stenosis of unspecified carotid artery: Secondary | ICD-10-CM | POA: Insufficient documentation

## 2012-05-28 DIAGNOSIS — J449 Chronic obstructive pulmonary disease, unspecified: Secondary | ICD-10-CM | POA: Insufficient documentation

## 2012-05-28 DIAGNOSIS — Z01812 Encounter for preprocedural laboratory examination: Secondary | ICD-10-CM | POA: Insufficient documentation

## 2012-05-28 DIAGNOSIS — Z01810 Encounter for preprocedural cardiovascular examination: Secondary | ICD-10-CM | POA: Insufficient documentation

## 2012-05-28 DIAGNOSIS — J4489 Other specified chronic obstructive pulmonary disease: Secondary | ICD-10-CM | POA: Insufficient documentation

## 2012-05-28 HISTORY — DX: Cerebral infarction, unspecified: I63.9

## 2012-05-28 HISTORY — DX: Myoneural disorder, unspecified: G70.9

## 2012-05-28 HISTORY — DX: Depression, unspecified: F32.A

## 2012-05-28 HISTORY — DX: Anxiety disorder, unspecified: F41.9

## 2012-05-28 HISTORY — DX: Headache: R51

## 2012-05-28 HISTORY — DX: Major depressive disorder, single episode, unspecified: F32.9

## 2012-05-28 HISTORY — DX: Gastro-esophageal reflux disease without esophagitis: K21.9

## 2012-05-28 LAB — URINALYSIS, ROUTINE W REFLEX MICROSCOPIC
Ketones, ur: NEGATIVE mg/dL
Protein, ur: NEGATIVE mg/dL
Urobilinogen, UA: 0.2 mg/dL (ref 0.0–1.0)

## 2012-05-28 LAB — TYPE AND SCREEN
ABO/RH(D): A NEG
Antibody Screen: NEGATIVE

## 2012-05-28 LAB — CBC
HCT: 45.2 % (ref 39.0–52.0)
MCHC: 35.6 g/dL (ref 30.0–36.0)
MCV: 93.6 fL (ref 78.0–100.0)
RDW: 12.6 % (ref 11.5–15.5)

## 2012-05-28 LAB — COMPREHENSIVE METABOLIC PANEL
Albumin: 4.2 g/dL (ref 3.5–5.2)
BUN: 9 mg/dL (ref 6–23)
Creatinine, Ser: 0.89 mg/dL (ref 0.50–1.35)
GFR calc Af Amer: >90 mL/min (ref >90)
Glucose, Bld: 99 mg/dL (ref 70–99)
Total Bilirubin: 0.5 mg/dL (ref 0.3–1.2)
Total Protein: 7.5 g/dL (ref 6.0–8.3)

## 2012-05-28 LAB — ABO/RH: ABO/RH(D): A NEG

## 2012-05-28 LAB — URINE MICROSCOPIC-ADD ON

## 2012-05-28 LAB — SURGICAL PCR SCREEN: MRSA, PCR: NEGATIVE

## 2012-05-28 MED ORDER — IOHEXOL 350 MG/ML SOLN
50.0000 mL | Freq: Once | INTRAVENOUS | Status: AC | PRN
  Administered 2012-05-28: 50 mL via INTRAVENOUS

## 2012-05-28 NOTE — Pre-Procedure Instructions (Signed)
20 AMAHD MORINO  05/28/2012   Your procedure is scheduled on:  Monday June 10, 2012  Report to [facility] at 7:30 AM.  Call this number if you have problems the morning of surgery: [phone]   Remember:   Do not eat food or drink :After Midnight.      Take these medicines the morning of surgery with A SIP OF WATER: celexa, ativan, metoprolol,omeprazole, zetia,   Do not wear jewelry, make-up or nail polish.  Do not wear lotions, powders, or perfumes. You may wear deodorant.  Do not shave 48 hours prior to surgery. Men may shave face and neck.  Do not bring valuables to the hospital.  Contacts, dentures or bridgework may not be worn into surgery.  Leave suitcase in the car. After surgery it may be brought to your room.  For patients admitted to the hospital, checkout time is 11:00 AM the day of discharge.   Patients discharged the day of surgery will not be allowed to drive home.  Name and phone number of your driver: family / friend  Special Instructions: Shower using CHG 2 nights before surgery and the night before surgery.  If you shower the day of surgery use CHG.  Use special wash - you have one bottle of CHG for all showers.  You should use approximately 1/3 of the bottle for each shower.   Please read over the following fact sheets that you were given: Pain Booklet, Coughing and Deep Breathing, Blood Transfusion Information, MRSA Information and Surgical Site Infection Prevention

## 2012-05-28 NOTE — Progress Notes (Signed)
Faxed request to [facility] for copy of stress test.  Forwarded chart to anesthesia to review stress test and notes from Dr. Andee Lineman.

## 2012-05-29 NOTE — Consult Note (Addendum)
Anesthesia chart review: Patient is a 61 year old male scheduled for right carotid endarterectomy by Dr. Arbie Cookey on 06/10/2012.  History includes multiple sclerosis '89, TIA, obesity, former smoker, CAD s/p mid RCA and proximal LAD stents ~ 07, depression, GERD, headaches, anxiety, hypertension, hyperlipidemia.  PCP is Dr. Doreen Beam.  He has seen Cardiologist Dr. Earnestine Leys, but not since 2011 when he had a negative stress echo.  His Neurologist is Dr. Marjory Lies.  Labs noted.  Cr 0.89, H/H WNL.  PLT 149, PT/PTT WNL.    Chest x-ray on 05/28/2012 showed probable COPD. No acute cardiopulmonary process.  EKG on 05/28/12 showed SB @ 58 bpm.  Stress echo on 03/23/10 [facility]) showed excellent exercise capacity (13 METS), normal left ventricular function with EF 55-60%.  Negative electrocardiographic and echocardiographic stress test.    I called and spoke with Mr. Larmon.  He denies chest pain, SOB at rest.  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  He does have mild ankle edema after prolonged standing.  From an MS standpoint, he reports that he recently started seeing Dr. Marjory Lies, and he was the one who actually referred him to [facility].  He does not have any dysphagia or breathing difficulties. He does not use any assistive devices, but is starting to intermittently stagger.  He was recently told that his MS was progressing, and that he may need to restart medication in the near future.  (He has been off of Betaseron for 1 1/2 to 2 years.)  He had recent CT and MRI scan that are available for review under the Results Review tab.  I reviewed history with Anesthesiologist Dr. Katrinka Blazing.  If no new CV symptoms then anticipate he can proceed as planned.  I will request his last Neurology note.  Shonna Chock, PA-C 05/29/12 1634  Addendum: 06/04/12 1555 I reviewed patient's 9/11/3 office note  with Dr. Marjory Lies.  It mentions starting him on tecfidera for MS.  As of last week, Mr. Loren had not started it yet.  I reviewed Neurology records with Anesthesiologist Dr. Noreene Larsson.  If no acute MS exacerbations and no CV symptoms (as above) then plan to proceed.

## 2012-05-30 NOTE — Progress Notes (Signed)
Requested last office note from Dr. Richrd Humbles office.

## 2012-05-31 ENCOUNTER — Inpatient Hospital Stay (HOSPITAL_COMMUNITY): Admission: RE | Admit: 2012-05-31 | Payer: 59 | Source: Ambulatory Visit

## 2012-06-03 ENCOUNTER — Other Ambulatory Visit (HOSPITAL_COMMUNITY): Payer: 59

## 2012-06-03 NOTE — Progress Notes (Signed)
Requested last office note from [facility]. Spoke with medical records stated they would fax over same

## 2012-06-09 MED ORDER — DEXTROSE 5 % IV SOLN
1.5000 g | INTRAVENOUS | Status: AC
  Administered 2012-06-10: 1.5 g via INTRAVENOUS
  Filled 2012-06-09: qty 1.5

## 2012-06-10 ENCOUNTER — Inpatient Hospital Stay (HOSPITAL_COMMUNITY)
Admission: RE | Admit: 2012-06-10 | Discharge: 2012-06-11 | DRG: 039 | Disposition: A | Discharge status: Home / Self Care | Payer: 59 | Source: Ambulatory Visit | Attending: Vascular Surgery | Admitting: Vascular Surgery

## 2012-06-10 ENCOUNTER — Encounter (HOSPITAL_COMMUNITY)
Admission: RE | Disposition: A | Discharge status: Home / Self Care | Payer: Self-pay | Source: Ambulatory Visit | Attending: Vascular Surgery

## 2012-06-10 ENCOUNTER — Encounter (HOSPITAL_COMMUNITY): Payer: Self-pay | Admitting: *Deleted

## 2012-06-10 ENCOUNTER — Encounter (HOSPITAL_COMMUNITY): Payer: Self-pay | Admitting: Vascular Surgery

## 2012-06-10 ENCOUNTER — Ambulatory Visit (HOSPITAL_COMMUNITY): Payer: 59 | Admitting: Vascular Surgery

## 2012-06-10 DIAGNOSIS — Z87891 Personal history of nicotine dependence: Secondary | ICD-10-CM

## 2012-06-10 DIAGNOSIS — F329 Major depressive disorder, single episode, unspecified: Secondary | ICD-10-CM | POA: Diagnosis present

## 2012-06-10 DIAGNOSIS — F3289 Other specified depressive episodes: Secondary | ICD-10-CM | POA: Diagnosis present

## 2012-06-10 DIAGNOSIS — I2510 Atherosclerotic heart disease of native coronary artery without angina pectoris: Secondary | ICD-10-CM | POA: Diagnosis present

## 2012-06-10 DIAGNOSIS — I10 Essential (primary) hypertension: Secondary | ICD-10-CM | POA: Diagnosis present

## 2012-06-10 DIAGNOSIS — Z8673 Personal history of transient ischemic attack (TIA), and cerebral infarction without residual deficits: Secondary | ICD-10-CM

## 2012-06-10 DIAGNOSIS — Z7982 Long term (current) use of aspirin: Secondary | ICD-10-CM

## 2012-06-10 DIAGNOSIS — Z9861 Coronary angioplasty status: Secondary | ICD-10-CM

## 2012-06-10 DIAGNOSIS — K219 Gastro-esophageal reflux disease without esophagitis: Secondary | ICD-10-CM | POA: Diagnosis present

## 2012-06-10 DIAGNOSIS — I6529 Occlusion and stenosis of unspecified carotid artery: Principal | ICD-10-CM | POA: Diagnosis present

## 2012-06-10 DIAGNOSIS — F411 Generalized anxiety disorder: Secondary | ICD-10-CM | POA: Diagnosis present

## 2012-06-10 DIAGNOSIS — E785 Hyperlipidemia, unspecified: Secondary | ICD-10-CM | POA: Diagnosis present

## 2012-06-10 DIAGNOSIS — R209 Unspecified disturbances of skin sensation: Secondary | ICD-10-CM | POA: Diagnosis present

## 2012-06-10 DIAGNOSIS — R4701 Aphasia: Secondary | ICD-10-CM | POA: Diagnosis present

## 2012-06-10 DIAGNOSIS — G35 Multiple sclerosis: Secondary | ICD-10-CM | POA: Diagnosis present

## 2012-06-10 HISTORY — PX: ENDARTERECTOMY: SHX5162

## 2012-06-10 SURGERY — ENDARTERECTOMY, CAROTID
Anesthesia: General | Site: Neck | Laterality: Right | Wound class: Clean

## 2012-06-10 MED ORDER — HYDRALAZINE HCL 20 MG/ML IJ SOLN: 10.0000 mg | INTRAMUSCULAR | Status: DC | PRN

## 2012-06-10 MED ORDER — SODIUM CHLORIDE 0.9 % IV SOLN: 500.0000 mL | Freq: Once | INTRAVENOUS | Status: AC | PRN

## 2012-06-10 MED ORDER — FENTANYL CITRATE 0.05 MG/ML IJ SOLN
INTRAMUSCULAR | Status: DC | PRN
  Administered 2012-06-10: 50 ug via INTRAVENOUS
  Administered 2012-06-10: 100 ug via INTRAVENOUS
  Administered 2012-06-10 (×2): 50 ug via INTRAVENOUS

## 2012-06-10 MED ORDER — CITALOPRAM HYDROBROMIDE 20 MG PO TABS
20.0000 mg | ORAL_TABLET | Freq: Every day | ORAL | Status: DC
  Filled 2012-06-10: qty 1

## 2012-06-10 MED ORDER — HYDROMORPHONE HCL PF 1 MG/ML IJ SOLN
0.2500 mg | INTRAMUSCULAR | Status: DC | PRN
  Administered 2012-06-10 (×2): 0.25 mg via INTRAVENOUS

## 2012-06-10 MED ORDER — METOPROLOL TARTRATE 1 MG/ML IV SOLN: 2.0000 mg | INTRAVENOUS | Status: DC | PRN

## 2012-06-10 MED ORDER — HYDROMORPHONE HCL PF 1 MG/ML IJ SOLN
INTRAMUSCULAR | Status: AC
  Filled 2012-06-10: qty 1

## 2012-06-10 MED ORDER — GLYCOPYRROLATE 0.2 MG/ML IJ SOLN
INTRAMUSCULAR | Status: DC | PRN
  Administered 2012-06-10: .8 mg via INTRAVENOUS

## 2012-06-10 MED ORDER — EZETIMIBE 10 MG PO TABS
10.0000 mg | ORAL_TABLET | Freq: Every day | ORAL | Status: DC
  Filled 2012-06-10: qty 1

## 2012-06-10 MED ORDER — ONDANSETRON HCL 4 MG/2ML IJ SOLN
INTRAMUSCULAR | Status: DC | PRN
  Administered 2012-06-10: 4 mg via INTRAVENOUS

## 2012-06-10 MED ORDER — SODIUM CHLORIDE 0.9 % IV SOLN: INTRAVENOUS | Status: DC

## 2012-06-10 MED ORDER — OXYCODONE-ACETAMINOPHEN 5-325 MG PO TABS: 1.0000 | ORAL_TABLET | ORAL | Status: DC | PRN

## 2012-06-10 MED ORDER — ASPIRIN-DIPYRIDAMOLE ER 25-200 MG PO CP12
1.0000 | ORAL_CAPSULE | Freq: Two times a day (BID) | ORAL | Status: DC
  Filled 2012-06-10 (×2): qty 1

## 2012-06-10 MED ORDER — HEPARIN SODIUM (PORCINE) 1000 UNIT/ML IJ SOLN
INTRAMUSCULAR | Status: DC | PRN
  Administered 2012-06-10: 10000 [IU] via INTRAVENOUS

## 2012-06-10 MED ORDER — ACETAMINOPHEN 650 MG RE SUPP: 325.0000 mg | RECTAL | Status: DC | PRN

## 2012-06-10 MED ORDER — ROCURONIUM BROMIDE 100 MG/10ML IV SOLN
INTRAVENOUS | Status: DC | PRN
  Administered 2012-06-10: 50 mg via INTRAVENOUS

## 2012-06-10 MED ORDER — PANTOPRAZOLE SODIUM 40 MG PO TBEC: 40.0000 mg | DELAYED_RELEASE_TABLET | Freq: Every day | ORAL | Status: DC

## 2012-06-10 MED ORDER — LABETALOL HCL 5 MG/ML IV SOLN: 10.0000 mg | INTRAVENOUS | Status: DC | PRN

## 2012-06-10 MED ORDER — SODIUM CHLORIDE 0.9 % IV SOLN
INTRAVENOUS | Status: DC
  Administered 2012-06-10: 100 mL/h via INTRAVENOUS

## 2012-06-10 MED ORDER — ACETAMINOPHEN 10 MG/ML IV SOLN: 1000.0000 mg | Freq: Once | INTRAVENOUS | Status: DC | PRN

## 2012-06-10 MED ORDER — DOPAMINE-DEXTROSE 3.2-5 MG/ML-% IV SOLN: 3.0000 ug/kg/min | INTRAVENOUS | Status: DC | PRN

## 2012-06-10 MED ORDER — DEXTROSE 5 % IV SOLN
1.5000 g | Freq: Two times a day (BID) | INTRAVENOUS | Status: AC
  Administered 2012-06-10 – 2012-06-11 (×2): 1.5 g via INTRAVENOUS
  Filled 2012-06-10 (×2): qty 1.5

## 2012-06-10 MED ORDER — NEOSTIGMINE METHYLSULFATE 1 MG/ML IJ SOLN
INTRAMUSCULAR | Status: DC | PRN
  Administered 2012-06-10: 5 mg via INTRAVENOUS

## 2012-06-10 MED ORDER — LIDOCAINE HCL (CARDIAC) 20 MG/ML IV SOLN
INTRAVENOUS | Status: DC | PRN
  Administered 2012-06-10: 40 mg via INTRAVENOUS

## 2012-06-10 MED ORDER — LACTATED RINGERS IV SOLN
INTRAVENOUS | Status: DC | PRN
  Administered 2012-06-10 (×2): via INTRAVENOUS

## 2012-06-10 MED ORDER — LORAZEPAM 0.5 MG PO TABS
0.5000 mg | ORAL_TABLET | Freq: Two times a day (BID) | ORAL | Status: DC | PRN
  Filled 2012-06-10: qty 1

## 2012-06-10 MED ORDER — PROPOFOL 10 MG/ML IV BOLUS
INTRAVENOUS | Status: DC | PRN
  Administered 2012-06-10: 140 mg via INTRAVENOUS

## 2012-06-10 MED ORDER — SODIUM CHLORIDE 0.9 % IR SOLN
Status: DC | PRN
  Administered 2012-06-10: 11:00:00

## 2012-06-10 MED ORDER — ONDANSETRON HCL 4 MG/2ML IJ SOLN: 4.0000 mg | Freq: Once | INTRAMUSCULAR | Status: DC | PRN

## 2012-06-10 MED ORDER — POTASSIUM CHLORIDE CRYS ER 20 MEQ PO TBCR: 20.0000 meq | EXTENDED_RELEASE_TABLET | Freq: Every day | ORAL | Status: DC | PRN

## 2012-06-10 MED ORDER — LABETALOL HCL 5 MG/ML IV SOLN
INTRAVENOUS | Status: DC | PRN
  Administered 2012-06-10 (×2): 5 mg via INTRAVENOUS

## 2012-06-10 MED ORDER — PHENYLEPHRINE HCL 10 MG/ML IJ SOLN
10.0000 mg | INTRAVENOUS | Status: DC | PRN
  Administered 2012-06-10: 20 ug/min via INTRAVENOUS

## 2012-06-10 MED ORDER — 0.9 % SODIUM CHLORIDE (POUR BTL) OPTIME
TOPICAL | Status: DC | PRN
  Administered 2012-06-10: 2000 mL

## 2012-06-10 MED ORDER — PROTAMINE SULFATE 10 MG/ML IV SOLN
INTRAVENOUS | Status: DC | PRN
  Administered 2012-06-10: 50 mg via INTRAVENOUS

## 2012-06-10 MED ORDER — GUAIFENESIN-DM 100-10 MG/5ML PO SYRP: 15.0000 mL | ORAL_SOLUTION | ORAL | Status: DC | PRN

## 2012-06-10 MED ORDER — ONDANSETRON HCL 4 MG/2ML IJ SOLN
4.0000 mg | Freq: Four times a day (QID) | INTRAMUSCULAR | Status: DC | PRN
  Administered 2012-06-10: 4 mg via INTRAVENOUS
  Filled 2012-06-10: qty 2

## 2012-06-10 MED ORDER — MORPHINE SULFATE 2 MG/ML IJ SOLN
2.0000 mg | INTRAMUSCULAR | Status: DC | PRN
  Administered 2012-06-10 (×3): 2 mg via INTRAVENOUS
  Administered 2012-06-11: 4 mg via INTRAVENOUS
  Filled 2012-06-10 (×2): qty 1
  Filled 2012-06-10: qty 2
  Filled 2012-06-10: qty 1

## 2012-06-10 MED ORDER — PHENOL 1.4 % MT LIQD: 1.0000 | OROMUCOSAL | Status: DC | PRN

## 2012-06-10 MED ORDER — ARTIFICIAL TEARS OP OINT
TOPICAL_OINTMENT | OPHTHALMIC | Status: DC | PRN
  Administered 2012-06-10: 1 via OPHTHALMIC

## 2012-06-10 MED ORDER — ACETAMINOPHEN 325 MG PO TABS: 325.0000 mg | ORAL_TABLET | ORAL | Status: DC | PRN

## 2012-06-10 MED ORDER — METOPROLOL TARTRATE 25 MG PO TABS
25.0000 mg | ORAL_TABLET | Freq: Two times a day (BID) | ORAL | Status: DC
  Administered 2012-06-10: 25 mg via ORAL
  Filled 2012-06-10 (×3): qty 1

## 2012-06-10 MED ORDER — DOCUSATE SODIUM 100 MG PO CAPS: 100.0000 mg | ORAL_CAPSULE | Freq: Every day | ORAL | Status: DC

## 2012-06-10 MED ORDER — LACTATED RINGERS IV SOLN
INTRAVENOUS | Status: DC
  Administered 2012-06-10: 09:00:00 via INTRAVENOUS

## 2012-06-10 SURGICAL SUPPLY — 50 items
APL SKNCLS STERI-STRIP NONHPOA (GAUZE/BANDAGES/DRESSINGS) ×1
BENZOIN TINCTURE PRP APPL 2/3 (GAUZE/BANDAGES/DRESSINGS) ×2 IMPLANT
CANISTER SUCTION 2500CC (MISCELLANEOUS) ×2 IMPLANT
CATH ROBINSON RED A/P 18FR (CATHETERS) ×2 IMPLANT
CLIP LIGATING EXTRA MED SLVR (CLIP) ×2 IMPLANT
CLIP LIGATING EXTRA SM BLUE (MISCELLANEOUS) ×2 IMPLANT
CLOTH BEACON ORANGE TIMEOUT ST (SAFETY) ×2 IMPLANT
COVER SURGICAL LIGHT HANDLE (MISCELLANEOUS) ×2 IMPLANT
CRADLE DONUT ADULT HEAD (MISCELLANEOUS) ×2 IMPLANT
DECANTER SPIKE VIAL GLASS SM (MISCELLANEOUS) IMPLANT
DRAIN HEMOVAC 1/8 X 5 (WOUND CARE) IMPLANT
DRAPE WARM FLUID 44X44 (DRAPE) ×2 IMPLANT
DRSG COVADERM 4X8 (GAUZE/BANDAGES/DRESSINGS) ×1 IMPLANT
ELECT REM PT RETURN 9FT ADLT (ELECTROSURGICAL) ×2
ELECTRODE REM PT RTRN 9FT ADLT (ELECTROSURGICAL) ×1 IMPLANT
EVACUATOR SILICONE 100CC (DRAIN) IMPLANT
GEL ULTRASOUND 20GR AQUASONIC (MISCELLANEOUS) IMPLANT
GLOVE BIOGEL M 6.5 STRL (GLOVE) ×1 IMPLANT
GLOVE BIOGEL PI IND STRL 6.5 (GLOVE) IMPLANT
GLOVE BIOGEL PI IND STRL 7.0 (GLOVE) IMPLANT
GLOVE BIOGEL PI IND STRL 7.5 (GLOVE) IMPLANT
GLOVE BIOGEL PI INDICATOR 6.5 (GLOVE) ×2
GLOVE BIOGEL PI INDICATOR 7.0 (GLOVE) ×1
GLOVE BIOGEL PI INDICATOR 7.5 (GLOVE) ×2
GLOVE SS BIOGEL STRL SZ 7.5 (GLOVE) ×1 IMPLANT
GLOVE SUPERSENSE BIOGEL SZ 7.5 (GLOVE) ×1
GLOVE SURG SS PI 6.5 STRL IVOR (GLOVE) ×4 IMPLANT
GLOVE SURG SS PI 7.5 STRL IVOR (GLOVE) ×2 IMPLANT
GOWN PREVENTION PLUS XLARGE (GOWN DISPOSABLE) ×2 IMPLANT
GOWN STRL NON-REIN LRG LVL3 (GOWN DISPOSABLE) ×6 IMPLANT
KIT BASIN OR (CUSTOM PROCEDURE TRAY) ×2 IMPLANT
KIT ROOM TURNOVER OR (KITS) ×2 IMPLANT
NEEDLE 22X1 1/2 (OR ONLY) (NEEDLE) IMPLANT
NS IRRIG 1000ML POUR BTL (IV SOLUTION) ×4 IMPLANT
PACK CAROTID (CUSTOM PROCEDURE TRAY) ×2 IMPLANT
PAD ARMBOARD 7.5X6 YLW CONV (MISCELLANEOUS) ×4 IMPLANT
PATCH HEMASHIELD 8X75 (Vascular Products) ×1 IMPLANT
SHUNT CAROTID BYPASS 10 (VASCULAR PRODUCTS) ×1 IMPLANT
SHUNT CAROTID BYPASS 12FRX15.5 (VASCULAR PRODUCTS) IMPLANT
SPECIMEN JAR SMALL (MISCELLANEOUS) ×2 IMPLANT
STRIP CLOSURE SKIN 1/2X4 (GAUZE/BANDAGES/DRESSINGS) ×2 IMPLANT
SUT ETHILON 3 0 PS 1 (SUTURE) IMPLANT
SUT PROLENE 6 0 CC (SUTURE) ×2 IMPLANT
SUT VIC AB 3-0 SH 27 (SUTURE) ×4
SUT VIC AB 3-0 SH 27X BRD (SUTURE) ×2 IMPLANT
SUT VICRYL 4-0 PS2 18IN ABS (SUTURE) ×2 IMPLANT
SYR CONTROL 10ML LL (SYRINGE) IMPLANT
TOWEL OR 17X24 6PK STRL BLUE (TOWEL DISPOSABLE) ×2 IMPLANT
TOWEL OR 17X26 10 PK STRL BLUE (TOWEL DISPOSABLE) ×2 IMPLANT
WATER STERILE IRR 1000ML POUR (IV SOLUTION) ×2 IMPLANT

## 2012-06-10 NOTE — Anesthesia Postprocedure Evaluation (Signed)
Anesthesia Post Note  Patient: Grant Valdez  Procedure(s) Performed: Procedure(s) (LRB): ENDARTERECTOMY CAROTID (Right)  Anesthesia type: general  Patient location: PACU  Post pain: Pain level controlled  Post assessment: Patient's Cardiovascular Status Stable  Last Vitals:  Filed Vitals:   06/10/12 1345  BP:   Pulse: 58  Temp:   Resp: 13    Post vital signs: Reviewed and stable  Level of consciousness: sedated  Complications: No apparent anesthesia complications

## 2012-06-10 NOTE — H&P (View-Only) (Signed)
[facility] of    Patient name: Grant Valdez MRN: [id number] DOB: 11/28/1950 Sex: male   Referred by: [name]  Reason for referral:  Chief Complaint  Patient presents with  . New Evaluation  . Carotid    HISTORY OF PRESENT ILLNESS: The patient presents today for evaluation of right carotid stenosis. He is a very complex past history. He does have a history of multiple sclerosis diagnosed in 1989. He has had progressive symptoms of generalized weakness and numbness. He has had what he describes as 3 separate instances where he has had a focal numbness and weakness in the left side of his face. These risk cover spontaneously after several minutes. He denies any left or right arm or leg focal weakness other than his generalized weakness from MS. He does have history of prior prior coronary disease with coronary stents x2. He does occasionally report "floaters" from his visual fields but no true amaurosis.  Past Medical History  Diagnosis Date  . MS (multiple sclerosis)     diagnosed in 1989. He was treated with prednisone followed by betaseron which was gradually tapered because his LFTs went up and it resolved off therapy. Likely he is remained in remission for years  . HTN (hypertension)   . CAD (coronary artery disease)     had 2 arteries tented four years ago  . HLD (hyperlipidemia)   . S/P right inguinal herniorrhaphy   . TIA (transient ischemic attack)     Past Surgical History  Procedure Date  . Colonoscopy March 12, 2006    Had 2 polyps removed; one was an adenoma and the other one was hyperplastic  . Stents   . Varicose removal from scrotum 1982  . Hydrocele excision 2011  . Vasectomy 1989  . Hernia repair age 18    History   Social History  . Marital Status: Married    Spouse Name: N/A    Number of Children: N/A  . Years of Education: N/A   Occupational History  . Not on file.   Social History Main Topics  . Smoking status:  Former Smoker    Quit date: 08/21/1993  . Smokeless tobacco: Not on file  . Alcohol Use: No  . Drug Use: No  . Sexually Active:    Other Topics Concern  . Not on file   Social History Narrative   Full time. Married. Works at [employer].     Family History  Problem Relation Age of Onset  . Diabetes Mother   . Hypertension Mother   . Heart attack Father     Allergies as of 05/21/2012  . (No Known Allergies)    Current Outpatient Prescriptions on File Prior to Visit  Medication Sig Dispense Refill  . citalopram (CELEXA) 20 MG tablet Take 20 mg by mouth daily.        . dipyridamole-aspirin (AGGRENOX) 25-200 MG per 12 hr capsule Take 1 capsule by mouth 2 (two) times daily.        . ezetimibe (ZETIA) 10 MG tablet Take 10 mg by mouth daily.        . LORazepam (ATIVAN) 0.5 MG tablet Take 0.5 mg by mouth 2 (two) times daily as needed. nerves      . metoprolol (LOPRESSOR) 50 MG tablet Take 25 mg by mouth 2 (two) times daily.       . Multiple Vitamins-Minerals (MULTIVITAL) tablet Take 1 tablet by mouth daily.        .   Omega-3 Fatty Acids (FISH OIL) 1000 MG CAPS Take by mouth 2 (two) times daily.        . omeprazole (PRILOSEC) 20 MG capsule Take 20 mg by mouth daily.           REVIEW OF SYSTEMS:  Positives indicated with an "X"  CARDIOVASCULAR:  [x ] chest pain   [ ] chest pressure   [ ] palpitations   [ ] orthopnea   [x ] dyspnea on exertion   [ ] claudication   [ ] rest pain   [ ] DVT   [ ] phlebitis PULMONARY:   [ ] productive cough   [ ] asthma   [ ] wheezing NEUROLOGIC:   [x ] weakness  [x ] paresthesias  [x ] aphasia  [ ] amaurosis  [ x] dizziness HEMATOLOGIC:   [ ] bleeding problems   [ ] clotting disorders MUSCULOSKELETAL:  [ ] joint pain   [ ] joint swelling GASTROINTESTINAL: [ ]  blood in stool  [ ]  hematemesis GENITOURINARY:  [ ]  dysuria  [ ]  hematuria PSYCHIATRIC:  [ ] history of major depression INTEGUMENTARY:  [ ] rashes  [ ] ulcers CONSTITUTIONAL:  [ ]  fever   [ ] chills  PHYSICAL EXAMINATION:  General: The patient is a well-nourished male, in no acute distress. Vital signs are BP 136/81  Pulse 64  Resp 18  Ht 6' (1.829 m)  Wt 234 lb 9.6 oz (106.414 kg)  BMI 31.82 kg/m2 Pulmonary: There is a good air exchange bilaterally without wheezing or rales. Abdomen: Soft and non-tender with normal pitch bowel sounds. Musculoskeletal: There are no major deformities.  There is no significant extremity pain. Neurologic: No focal weakness or paresthesias are detected, Skin: There are no ulcer or rashes noted. Psychiatric: The patient has normal affect. Cardiovascular: There is a regular rate and rhythm without significant murmur appreciated. Carotid arteries without bruits bilaterally 2+ radial pulses bilaterally  [facility] Vascular Lab Studies:  Ordered and Independently Reviewed carotid duplex in our office today reveals right internal carotid artery stenosis in the 80-99% range with no significant stenosis in the left carotid system. He does have a high bifurcation and the internal carotid extends up past the bifurcation.  I have compared this with his outside study which have also reviewed and have reviewed the records that were supplied with him. This showed a lesser degree of stenosis predicted on the right in the 60% range.  Impression and Plan:  Right internal carotid artery stenosis with possible right brain TIA event. I explained that we proceed with endarterectomy based on duplex on approximately 90% of cases. I did explain that his high bifurcation and extension of plaque in his internal carotid above the level of visualization with ultrasound needs further imaging is warranted. He is scheduled for a CT angiogram for further evaluation of both degree of narrowing and also to assure that his internal carotid becomes normal again above the bifurcation. Assuming that he does have a normal internal and is accessible we would recommend  endarterectomy based on probable right brain event. I discussed the procedure with the expected one night hospitalization and the 1-2% risk of stroke with surgery. I. he and his wife understand and wish to proceed. We have tentatively scheduled the surgery for 06/10/2012 pending his CT angiogram    Megahn Killings [facility] [place] Office: [phone]         

## 2012-06-10 NOTE — Preoperative (Signed)
Beta Blockers   Reason not to administer Beta Blockers:Pt took metoprolol this am 

## 2012-06-10 NOTE — Discharge Summary (Signed)
[facility] Discharge Summary   Patient ID:  Grant Valdez MRN: [id number] DOB/AGE: 1950-12-14 61 y.o.  Admit date: 06/10/2012 Discharge date: 06/11/12 Date of Surgery: 06/10/2012 Surgeon: Surgeon(s): Larina Earthly, MD  Admission Diagnosis: RIGHT ICA STENOSIS  Discharge Diagnoses:  RIGHT ICA STENOSIS  Secondary Diagnoses: Past Medical History  Diagnosis Date  . MS (multiple sclerosis)     diagnosed in 1989. He was treated with prednisone followed by betaseron which was gradually tapered because his LFTs went up and it resolved off therapy. Likely he is remained in remission for years  . HLD (hyperlipidemia)   . S/P right inguinal herniorrhaphy   . TIA (transient ischemic attack)   . CAD (coronary artery disease)     had 2 arteries tented four years ago sees Dr. Andee Lineman  . HTN (hypertension)     Sees Dr. Sherril Croon  . Depression   . Anxiety   . GERD (gastroesophageal reflux disease)   . Headache     migraines hx of  . Neuromuscular disorder     has Multiple Sclerosis, sees Dr. Marjory Lies  . Stroke     "patient states has had several TIA's, last one 05/27/12"    Procedure(s): ENDARTERECTOMY CAROTID  Discharged Condition: good  HPI:  Grant Valdez is a 61 y.o. male who presented for evaluation of right carotid stenosis. He is a very complex past history. He does have a history of multiple sclerosis diagnosed in 1989. He has had progressive symptoms of generalized weakness and numbness. He has had what he describes as 3 separate instances where he has had a focal numbness and weakness in the left side of his face. These risk cover spontaneously after several minutes. He denies any left or right arm or leg focal weakness other than his generalized weakness from MS. He does have history of prior prior coronary disease with coronary stents x2. He does occasionally report "floaters" from his visual fields but no true amaurosis. carotid duplex in our office today  reveals right internal carotid artery stenosis in the 80-99% range with no significant stenosis in the left carotid system. He does have a high bifurcation and the internal carotid extends up past the bifurcation.  Hospital Course:  Grant Valdez is a 61 y.o. male is S/P Right Procedure(s): ENDARTERECTOMY CAROTID Extubated: POD # 0 Post-op wounds healing well Pt. Ambulating, voiding and taking PO diet without difficulty. Pt pain controlled with PO pain meds. Labs as below Neuro exam: intact Complications: none  Consults:     Significant Diagnostic Studies: CBC Lab Results  Component Value Date   WBC 6.5 05/28/2012   HGB 16.1 05/28/2012   HCT 45.2 05/28/2012   MCV 93.6 05/28/2012   PLT 149* 05/28/2012    BMET    Component Value Date/Time   NA 137 05/28/2012 1126   K 5.1 05/28/2012 1126   CL 103 05/28/2012 1126   CO2 26 05/28/2012 1126   GLUCOSE 99 05/28/2012 1126   BUN 9 05/28/2012 1126   CREATININE 0.89 05/28/2012 1126   CALCIUM 9.7 05/28/2012 1126   GFRNONAA >90 05/28/2012 1126   GFRAA >90 05/28/2012 1126   COAG Lab Results  Component Value Date   INR 1.02 05/28/2012     Disposition:  Discharge to :Home Discharge Orders    Future Orders Please Complete By Expires   Resume previous diet      Driving Restrictions      Comments:   No driving for 2  weeks   Lifting restrictions      Comments:   No lifting for 4 weeks   Call MD for:  temperature >100.5      Call MD for:  redness, tenderness, or signs of infection (pain, swelling, bleeding, redness, odor or green/yellow discharge around incision site)      Call MD for:  severe or increased pain, loss or decreased feeling  in affected limb(s)      Increase activity slowly      Comments:   Walk with assistance use walker or cane as needed   May shower       Scheduling Instructions:   wednesday   No dressing needed      may wash over wound with mild soap and water      CAROTID Sugery: Call MD for difficulty swallowing  or speaking; weakness in arms or legs that is a new symtom; severe headache.  If you have increased swelling in the neck and/or  are having difficulty breathing, CALL 911         Grant Valdez  Home Medication Instructions [id number]   Printed on:06/10/12 1239  Medication Information                    dipyridamole-aspirin (AGGRENOX) 25-200 MG per 12 hr capsule Take 1 capsule by mouth 2 (two) times daily.             citalopram (CELEXA) 20 MG tablet Take 20 mg by mouth daily.             Omega-3 Fatty Acids (FISH OIL) 1000 MG CAPS Take 1 capsule by mouth 2 (two) times daily.            LORazepam (ATIVAN) 0.5 MG tablet Take 0.5 mg by mouth 2 (two) times daily as needed. nerves           metoprolol (LOPRESSOR) 50 MG tablet Take 25 mg by mouth 2 (two) times daily.            Multiple Vitamins-Minerals (MULTIVITAL) tablet Take 1 tablet by mouth daily.             omeprazole (PRILOSEC) 20 MG capsule Take 20 mg by mouth daily.             ezetimibe (ZETIA) 10 MG tablet Take 10 mg by mouth daily.              Verbal and written Discharge instructions given to the patient. Wound care per Discharge AVS   Signed: Marlowe Shores 06/10/2012, 12:39 PM

## 2012-06-10 NOTE — Transfer of Care (Signed)
Immediate Anesthesia Transfer of Care Note  Patient: Grant Valdez  Procedure(s) Performed: Procedure(s) (LRB) with comments: ENDARTERECTOMY CAROTID (Right) - With Dacron Patch Angioplasty  Patient Location: PACU  Anesthesia Type: General  Level of Consciousness: awake, alert , oriented and patient cooperative  Airway & Oxygen Therapy: Patient Spontanous Breathing and Patient connected to face mask oxygen  Post-op Assessment: Report given to PACU RN, Post -op Vital signs reviewed and stable and Patient moving all extremities X 4  Post vital signs: Reviewed and stable  Complications: No apparent anesthesia complications

## 2012-06-10 NOTE — Interval H&P Note (Signed)
History and Physical Interval Note:  06/10/2012 7:34 AM  Grant Valdez  has presented today for surgery, with the diagnosis of RIGHT ICA STENOSIS  The various methods of treatment have been discussed with the patient and family. After consideration of risks, benefits and other options for treatment, the patient has consented to  Procedure(s) (LRB) with comments: ENDARTERECTOMY CAROTID (Right) as a surgical intervention .  The patient's history has been reviewed, patient examined, no change in status, stable for surgery.  I have reviewed the patient's chart and labs.  Questions were answered to the patient's satisfaction.   Underwent CTangio neck on 10/8 confirmed high grade stenosis and patent ICA past the bifurcation.  Cavin Longman

## 2012-06-10 NOTE — Progress Notes (Signed)
UR COMPLETED  

## 2012-06-10 NOTE — Progress Notes (Signed)
Dangled patient to side of bed and became nauseated and vomitted about 200'ccs of clear effluent.  Medicated with zofran 4mg  IV.  Back to bed no complaints.

## 2012-06-10 NOTE — Progress Notes (Signed)
PHARMACIST - PHYSICIAN COMMUNICATION  DR:   Early CONCERNING:  Renal dose antibiotic adjustment  DESCRIPTION: 61yo male, s/p surgery today for carotid stenosis.  He received IV Cefuroxime 1.5gm at 10:45AM without noted complications. He has a creatinine of 0.89 with a clearance > 175ml/min.  He is to receive 2 additional doses of Cefuroxime and this is scheduled every 12 hours.  Since he has received one dose today, will continue this regimen although he could be dosed every 8 hours.  RECOMMENDATION: 1.  No change to antibiotics 2.  Pharmacy will sign off, please let us know if we can assist in any other issues regarding this patients medications.  Nadara Mustard, PharmD., MS Clinical Pharmacist Pager:  [phone]  Thank you for allowing pharmacy to be part of this patients care team.

## 2012-06-10 NOTE — Anesthesia Preprocedure Evaluation (Addendum)
Anesthesia Evaluation  Patient identified by MRN, date of birth, ID band Patient awake    Reviewed: Allergy & Precautions, H&P , NPO status , Patient's Chart, lab work & pertinent test results  Airway Mallampati: II      Dental  (+) Teeth Intact and Dental Advisory Given   Pulmonary  breath sounds clear to auscultation        Cardiovascular Rhythm:Regular Rate:Normal     Neuro/Psych    GI/Hepatic   Endo/Other    Renal/GU      Musculoskeletal   Abdominal   Peds  Hematology   Anesthesia Other Findings   Reproductive/Obstetrics                           Anesthesia Physical Anesthesia Plan  ASA: III  Anesthesia Plan: General   Post-op Pain Management:    Induction: Intravenous  Airway Management Planned: Oral ETT  Additional Equipment: Arterial line  Intra-op Plan:   Post-operative Plan: Extubation in OR  Informed Consent: I have reviewed the patients History and Physical, chart, labs and discussed the procedure including the risks, benefits and alternatives for the proposed anesthesia with the patient or authorized representative who has indicated his/her understanding and acceptance.   Dental advisory given  Plan Discussed with:   Anesthesia Plan Comments: (R. Carotid stenosis, h/o possible R. Brain TIAs Htn CAD S/P Stenting 2004, (-) stress ECHO 03/23/10 EF 55%  H//O MS previously treated with inferon GERD  Plan GA with art line  Kipp Brood, MD   )       Anesthesia Quick Evaluation

## 2012-06-10 NOTE — Op Note (Signed)
[facility] [place]  Patient name: Grant Valdez MRN: [id number] DOB: 03-23-1951 Sex: male  06/10/2012 Pre-operative Diagnosis: Asymptomatic right carotid stenosis Post-operative diagnosis:  Same Surgeon:  Larina Earthly, M.D. Assistants:  Roczniak Procedure:    right carotid Endarterectomy with Dacron patch angioplasty Anesthesia:  General Blood Loss:  See anesthesia record Specimens:  Carotid Plaque to pathology  Indications for surgery:  Severe right internal carotid artery stenosis, possibly symptomatic with left facial tingling  Procedure in detail:  The patient was taken to the operating and placed in the supine position. The neck was prepped and draped in the usual sterile fashion. An incision was made anterior to the sternocleidomastoid muscle and continued with electrocautery through the platysma muscle. The muscle was retracted posteriorly and the carotid sheath was opened. The facial vein was ligated with 2-0 silk ties and divided. The common carotid artery was encircled with an umbilical tape and Rummel tourniquet. Dissection was continued onto the carotid bifurcation. The superior thyroid artery was controlled with a 2-0 silk Potts tie. The external carotid organ was encircled with a vessel loop and the internal carotid was encircled with umbilical tape and Rummel tourniquet. The hypoglossal and vagus nerves were identified and preserved.  The patient was given systemic heparinization. After adequate circulation time, the internal,external and common carotid arteries were occluded. The common carotid was opened with an 11 blade and the arteriotomy was continued with Potts scissors onto the internal carotid artery. A 10 shunt was passed up the internal carotid artery, allowed to back bleed, and then passed down the common carotid artery. The shunt was secured with Rummel tourniquet. The endarterectomy was begun on the common carotid artery  plaque was divided  proximally with Potts scissors. The endarterectomy was continued onto the carotid bifurcation. The external carotid was endarterectomized by eversion technique and the internal carotid artery was endarterectomized in an open fashion. Remaining debris was removed from the endarterectomy plane. A Dacron patch was brought to the field and sewn as a patch angioplasty. Prior to completing the anastomosis, the shunt was removed and the usual flushing maneuvers were undertaken. The anastomosis was then completed and flow was restored first to the external and then the internal carotid artery. Excellent flow characteristics were noted with hand-held Doppler in the internal and external carotid arteries.  The patient was given protamine to reverse the heparin. Hemostasis was obtained with electrocautery. The wounds were irrigated with saline. The wound was closed by first reapproximating the sternocleidomastoid muscle over the carotid artery with interrupted 3-0 Vicryl sutures. Next, the platysma was closed with a running 3-0 Vicryl suture. The skin was closed with a 4-0 subcuticular Vicryl suture. Benzoin and Steri-Strips were applied to the incision. A sterile dressing was placed over the incision. All sponge and needle counts were correct. The patient was awakened in the operating room, neurologically intact. They were transferred to the PACU in stable condition.  Carotid stenosis at surgery: Greater than 80%  Disposition:  To PACU in stable condition,neurologically intact  Relevant Operative Details:  High carotid bifurcation  Larina Earthly, M.D. [facility] [place] Office: [phone] Pager:  [phone]

## 2012-06-10 NOTE — Progress Notes (Addendum)
Patient tolerating clear liquids will order home diet.

## 2012-06-11 ENCOUNTER — Encounter (HOSPITAL_COMMUNITY): Payer: Self-pay | Admitting: Vascular Surgery

## 2012-06-11 LAB — BASIC METABOLIC PANEL
BUN: 9 mg/dL (ref 6–23)
Calcium: 8.9 mg/dL (ref 8.4–10.5)
Creatinine, Ser: 0.79 mg/dL (ref 0.50–1.35)
GFR calc non Af Amer: >90 mL/min (ref >90)
Glucose, Bld: 126 mg/dL — ABNORMAL HIGH (ref 70–99)
Potassium: 4 mEq/L (ref 3.5–5.1)

## 2012-06-11 LAB — CBC
HCT: 41 % (ref 39.0–52.0)
Hemoglobin: 14.3 g/dL (ref 13.0–17.0)
MCH: 32.9 pg (ref 26.0–34.0)
MCHC: 34.9 g/dL (ref 30.0–36.0)
MCV: 94.3 fL (ref 78.0–100.0)
RDW: 12.6 % (ref 11.5–15.5)

## 2012-06-11 MED ORDER — PNEUMOCOCCAL VAC POLYVALENT 25 MCG/0.5ML IJ INJ
0.5000 mL | INJECTION | Freq: Once | INTRAMUSCULAR | Status: AC
  Administered 2012-06-11: 0.5 mL via INTRAMUSCULAR
  Filled 2012-06-11: qty 0.5

## 2012-06-11 MED ORDER — INFLUENZA VIRUS VACC SPLIT PF IM SUSP
0.5000 mL | Freq: Once | INTRAMUSCULAR | Status: AC
  Administered 2012-06-11: 0.5 mL via INTRAMUSCULAR
  Filled 2012-06-11 (×2): qty 0.5

## 2012-06-11 NOTE — Progress Notes (Signed)
Pt discharged home with wife and belongings via w/c. Discussed and explained discharge instructions, f/u appt., exit care notes on carotid endarectomy., and prescriptions given to pt. Denies pain at this time. Grant Valdez

## 2012-06-11 NOTE — Progress Notes (Signed)
Subjective: Interval History: none.. had some nausea immediately following surgery but this is complete resolved.  Objective: Vital signs in last 24 hours: Temp:  [96.9 F (36.1 C)-98.8 F (37.1 C)] 98.4 F (36.9 C) (10/22 0325) Pulse Rate:  [57-76] 76  (10/22 0325) Resp:  [7-19] 19  (10/22 0325) BP: (136-165)/(62-98) 165/81 mmHg (10/22 0325) SpO2:  [95 %-98 %] 96 % (10/22 0325) Arterial Line BP: (116-164)/(57-76) 151/57 mmHg (10/22 0000) Weight:  [234 lb 2.1 oz (106.2 kg)] 234 lb 2.1 oz (106.2 kg) (10/21 1428)  Intake/Output from previous day: 10/21 0701 - 10/22 0700 In: 2650 [P.O.:600; I.V.:2000; IV Piggyback:50] Out: 1815 [Urine:1600; Emesis/NG output:200; Blood:15] Intake/Output this shift:    Neuro intact. right neck incision looks good with moderate swelling. No hematoma.  Lab Results:  Basename 06/11/12 0400  WBC 9.6  HGB 14.3  HCT 41.0  PLT 145*   BMET  Basename 06/11/12 0400  NA 137  K 4.0  CL 100  CO2 24  GLUCOSE 126*  BUN 9  CREATININE 0.79  CALCIUM 8.9    Studies/Results: Dg Chest 2 View  05/28/2012  *RADIOLOGY REPORT*  Clinical Data: Preop evaluation.  CHEST - 2 VIEW  Comparison: None.  Findings: Mild hyperinflation and COPD.  Negative for mass or pneumonia.  Negative for heart failure.  Lungs are clear.  IMPRESSION: Probable COPD.  No acute cardiopulmonary process.   Original Report Authenticated By: Camelia Phenes, M.D.    Ct Angio Neck W/cm &/or Wo/cm  05/28/2012  *RADIOLOGY REPORT*  Clinical Data:  Flow limiting right internal carotid artery stenosis detected on duplex sonography, progressive over serial measurements.  History of right brain TIAs.  High bifurcation poorly visualized with ultrasound.  CT ANGIOGRAPHY NECK  Technique:  Multidetector CT imaging of the neck was performed using the standard protocol during bolus administration of intravenous contrast.  Multiplanar CT image reconstructions including MIPs were obtained to evaluate the  vascular anatomy. Carotid stenosis measurements (when applicable) are obtained utilizing NASCET criteria, using the distal internal carotid diameter as the denominator.  Contrast: 50mL OMNIPAQUE IOHEXOL 350 MG/ML SOLN  Comparison:   None.  Findings:  There is conventional branching of the great vessels from the arch.  Mild calcific atheromatous change of the transverse arch.  Slight proximal narrowing left subclavian is not flow reducing.  The right carotid bifurcation is quite high, beginning at the C3-C4 interspace.  At the right internal carotid artery origin, there is a high-grade stenosis of least 80% and possibly greater (1.0/4.6 ratio). Some calcific plaque is noted at the bulb but the majority of the plaque appears soft.  This plaque extends over a craniocaudad length of approximately 2 cm.  Distal to the high- grade stenosis, the right cervical internal carotid artery appears normal.  There is no dissection or fibromuscular dysplasia.  At the left carotid bifurcation, there is mild nonstenotic calcific and noncalcified plaque. There is no dissection or fibromuscular dysplasia involve the left cervical ICA.  Both vertebrals are patent although the right is diminutive.  The left is clearly dominant without proximal ostial narrowing.  No neck masses.  Cervical spondylosis.  Airway midline.  No lung apex lesion or pneumothorax.  Visualized intracranial compartment grossly unremarkable.   Review of the MIP images confirms the above findings.  IMPRESSION: Flow-limiting right internal carotid artery stenosis, predominately soft plaque, at least 80% and possibly greater.  Right carotid bifurcation is quite high, located near the C3-C4 interspace.   Original Report Authenticated By: Jonny Ruiz  T. CURNES, M.D.    Anti-infectives: Anti-infectives     Start     Dose/Rate Route Frequency Ordered Stop   06/10/12 1500   cefUROXime (ZINACEF) 1.5 g in dextrose 5 % 50 mL IVPB        1.5 g 100 mL/hr over 30 Minutes  Intravenous Every 12 hours 06/10/12 1445 06/11/12 0355   06/09/12 1405   cefUROXime (ZINACEF) 1.5 g in dextrose 5 % 50 mL IVPB        1.5 g 100 mL/hr over 30 Minutes Intravenous 30 min pre-op 06/09/12 1405 06/10/12 1045          Assessment/Plan: s/p Procedure(s) (LRB) with comments: ENDARTERECTOMY CAROTID (Right) - With Dacron Patch Angioplasty Stable postop day #1. Plan discharge today with followup in 3 weeks   LOS: 1 day   EARLY, TODD 06/11/2012, 7:22 AM

## 2012-06-24 ENCOUNTER — Encounter: Payer: Self-pay | Admitting: Vascular Surgery

## 2012-06-25 ENCOUNTER — Ambulatory Visit (INDEPENDENT_AMBULATORY_CARE_PROVIDER_SITE_OTHER): Payer: 59 | Admitting: Vascular Surgery

## 2012-06-25 ENCOUNTER — Encounter: Payer: Self-pay | Admitting: Vascular Surgery

## 2012-06-25 VITALS — BP 139/88 | HR 63 | Temp 97.8°F | Ht 72.0 in | Wt 238.9 lb

## 2012-06-25 DIAGNOSIS — I6529 Occlusion and stenosis of unspecified carotid artery: Secondary | ICD-10-CM

## 2012-06-25 DIAGNOSIS — Z48812 Encounter for surgical aftercare following surgery on the circulatory system: Secondary | ICD-10-CM

## 2012-06-25 NOTE — Progress Notes (Signed)
Patient presents today for followup of right carotid endarterectomy for severe stenosis 1 06/10/2012. He did well and was discharged home on postoperative day #1. He reports no difficulty since his procedure and has not had any pain medication other than Tylenol. He and his wife both report that he has had better speaking ability with less hesitancy. I explained that this would not have been a projected outcome from surgery in that he had surgery to reduce his risk for stroke. Nonetheless he certainly feels like he has better speech appeared  His exam a well-developed well-nourished gentleman in no acute distress. Right neck incision is healing nicely and he has no carotid bruits bilaterally. He is grossly intact neurologically  Impression and plan stable status post right carotid endarterectomy for high-grade stenosis. He will resume full activities that limitation. We'll see him in 6 months with repeat carotid duplex. He will notify should he develop any wound problems or neurologic deficit

## 2012-06-26 NOTE — Addendum Note (Signed)
Addended by: Sharee Pimple on: 06/26/2012 09:08 AM   Modules accepted: Orders

## 2012-07-25 ENCOUNTER — Other Ambulatory Visit (INDEPENDENT_AMBULATORY_CARE_PROVIDER_SITE_OTHER): Payer: Self-pay | Admitting: *Deleted

## 2012-07-25 DIAGNOSIS — Z86010 Personal history of colonic polyps: Secondary | ICD-10-CM

## 2012-07-29 ENCOUNTER — Telehealth (INDEPENDENT_AMBULATORY_CARE_PROVIDER_SITE_OTHER): Payer: Self-pay | Admitting: *Deleted

## 2012-07-29 DIAGNOSIS — Z1211 Encounter for screening for malignant neoplasm of colon: Secondary | ICD-10-CM

## 2012-07-29 MED ORDER — PEG-KCL-NACL-NASULF-NA ASC-C 100 G PO SOLR: 1.0000 | Freq: Once | ORAL | Status: DC

## 2012-07-29 NOTE — Telephone Encounter (Signed)
Patient needs movi prep 

## 2012-08-08 ENCOUNTER — Telehealth (INDEPENDENT_AMBULATORY_CARE_PROVIDER_SITE_OTHER): Payer: Self-pay | Admitting: *Deleted

## 2012-08-08 NOTE — Telephone Encounter (Signed)
  Procedure: tcs  Reason/Indication:  Hx polyps  Has patient had this procedure before?  yes  If so, when, by whom and where?  02/2006  Is there a family history of colon cancer?  no  Who?  What age when diagnosed?    Is patient diabetic?   no      Does patient have prosthetic heart valve?  no  Do you have a pacemaker?  no  Has patient had joint replacement within last 12 months?  no  Is patient on Coumadin, Plavix and/or Aspirin? yes  Medications: aggrenox 25 mg bid, tecfidera 240 mg bid (MS), pravastatin 20 mg daily, celexa 20 mg daily, omeprazole 20 mg daily, lorazepam 0.5 mg bid, zetia  10 mg daily, metoprolol 50 mg 1/2 tab bid, omega 3 fish oil 1000 mg bid, multi vit  Allergies: nkda  Medication Adjustment: aggrenox 2 days  Procedure date & time: 09/05/12 at 1030

## 2012-08-08 NOTE — Telephone Encounter (Signed)
agree

## 2012-08-29 ENCOUNTER — Encounter (HOSPITAL_COMMUNITY): Payer: Self-pay | Admitting: Pharmacy Technician

## 2012-10-09 ENCOUNTER — Encounter (HOSPITAL_COMMUNITY): Payer: Self-pay | Admitting: *Deleted

## 2012-10-09 ENCOUNTER — Ambulatory Visit (HOSPITAL_COMMUNITY)
Admission: RE | Admit: 2012-10-09 | Discharge: 2012-10-09 | Disposition: A | Discharge status: Home Health | Payer: BC Managed Care – PPO | Source: Ambulatory Visit | Attending: Internal Medicine | Admitting: Internal Medicine

## 2012-10-09 ENCOUNTER — Encounter (HOSPITAL_COMMUNITY)
Admission: RE | Disposition: A | Discharge status: Home Health | Payer: Self-pay | Source: Ambulatory Visit | Attending: Internal Medicine

## 2012-10-09 DIAGNOSIS — D126 Benign neoplasm of colon, unspecified: Secondary | ICD-10-CM | POA: Insufficient documentation

## 2012-10-09 DIAGNOSIS — K5730 Diverticulosis of large intestine without perforation or abscess without bleeding: Secondary | ICD-10-CM | POA: Insufficient documentation

## 2012-10-09 DIAGNOSIS — I10 Essential (primary) hypertension: Secondary | ICD-10-CM | POA: Insufficient documentation

## 2012-10-09 DIAGNOSIS — Z860100 Personal history of colon polyps, unspecified: Secondary | ICD-10-CM | POA: Insufficient documentation

## 2012-10-09 DIAGNOSIS — K644 Residual hemorrhoidal skin tags: Secondary | ICD-10-CM | POA: Insufficient documentation

## 2012-10-09 DIAGNOSIS — D128 Benign neoplasm of rectum: Secondary | ICD-10-CM | POA: Insufficient documentation

## 2012-10-09 HISTORY — PX: COLONOSCOPY: SHX5424

## 2012-10-09 SURGERY — COLONOSCOPY
Anesthesia: Moderate Sedation

## 2012-10-09 MED ORDER — SODIUM CHLORIDE 0.45 % IV SOLN
INTRAVENOUS | Status: DC
  Administered 2012-10-09: 1000 mL via INTRAVENOUS

## 2012-10-09 MED ORDER — MIDAZOLAM HCL 5 MG/5ML IJ SOLN
INTRAMUSCULAR | Status: AC
  Filled 2012-10-09: qty 10

## 2012-10-09 MED ORDER — MEPERIDINE HCL 50 MG/ML IJ SOLN
INTRAMUSCULAR | Status: AC
  Filled 2012-10-09: qty 1

## 2012-10-09 MED ORDER — MIDAZOLAM HCL 5 MG/5ML IJ SOLN
INTRAMUSCULAR | Status: DC | PRN
  Administered 2012-10-09: 2 mg via INTRAVENOUS
  Administered 2012-10-09: 1 mg via INTRAVENOUS
  Administered 2012-10-09 (×2): 2 mg via INTRAVENOUS

## 2012-10-09 MED ORDER — MEPERIDINE HCL 50 MG/ML IJ SOLN
INTRAMUSCULAR | Status: DC | PRN
  Administered 2012-10-09 (×2): 25 mg via INTRAVENOUS

## 2012-10-09 MED ORDER — STERILE WATER FOR IRRIGATION IR SOLN
Status: DC | PRN
  Administered 2012-10-09: 11:00:00

## 2012-10-09 NOTE — Progress Notes (Signed)
Spoke to Dr. Karilyn Cota at 12:38 about Mr. Grant Valdez. Dr. Karilyn Cota stated he could start back on Friday morning. No further instructions given.

## 2012-10-09 NOTE — H&P (Signed)
Grant Valdez is an 62 y.o. male.   Chief Complaint: Patient is here for colonoscopy. HPI: Patient is 62 year old occasion male is here for surveillance colonoscopy. He had 2 polyps removed back in 2007 and one was tubular adenoma. He denies abdominal pain change in his bowel habits or rectal bleeding. Family history is negative for colorectal carcinoma.  Past Medical History  Diagnosis Date  . MS (multiple sclerosis)     diagnosed in 1989. He was treated with prednisone followed by betaseron which was gradually tapered because his LFTs went up and it resolved off therapy. Likely he is remained in remission for years  . HLD (hyperlipidemia)   . S/P right inguinal herniorrhaphy   . TIA (transient ischemic attack)   . CAD (coronary artery disease)     had 2 arteries tented four years ago sees Dr. Andee Valdez  . HTN (hypertension)     Sees Dr. Sherril Valdez  . Depression   . Anxiety   . GERD (gastroesophageal reflux disease)   . Headache     migraines hx of  . Neuromuscular disorder     has Multiple Sclerosis, sees Dr. Marjory Valdez  . Stroke     "patient states has had several TIA's, last one 05/27/12"  . Carotid artery occlusion     Past Surgical History  Procedure Laterality Date  . Colonoscopy  March 12, 2006    Had 2 polyps removed; one was an adenoma and the other one was hyperplastic  . Stents    . Varicose removal from scrotum  1982  . Hydrocele excision  2011  . Vasectomy  1989  . Hernia repair  age 61  . Endarterectomy  06/10/2012    Procedure: ENDARTERECTOMY CAROTID;  Surgeon: Grant Earthly, MD;  Location: [facility] OR;  Service: Vascular;  Laterality: Right;  With Dacron Patch Angioplasty  . Carotid endarterectomy      Family History  Problem Relation Age of Onset  . Diabetes Mother   . Hypertension Mother   . Heart attack Father    Social History:  reports that he quit smoking about 19 years ago. He has never used smokeless tobacco. He reports that he does not drink alcohol or use  illicit drugs.  Allergies: No Known Allergies  Medications Prior to Admission  Medication Sig Dispense Refill  . citalopram (CELEXA) 20 MG tablet Take 20 mg by mouth daily.        . Dimethyl Fumarate (TECFIDERA) 240 MG CPDR Take 1 capsule by mouth 2 (two) times daily.      Marland Kitchen dipyridamole-aspirin (AGGRENOX) 25-200 MG per 12 hr capsule Take 1 capsule by mouth 2 (two) times daily.        Marland Kitchen ezetimibe (ZETIA) 10 MG tablet Take 10 mg by mouth daily.        Marland Kitchen LORazepam (ATIVAN) 0.5 MG tablet Take 0.5 mg by mouth 2 (two) times daily as needed. nerves      . metoprolol (LOPRESSOR) 50 MG tablet Take 25 mg by mouth 2 (two) times daily.       . Multiple Vitamins-Minerals (MULTIVITAL) tablet Take 1 tablet by mouth daily.        . Omega-3 Fatty Acids (FISH OIL) 1000 MG CAPS Take 1 capsule by mouth 2 (two) times daily.       Marland Kitchen omeprazole (PRILOSEC) 20 MG capsule Take 20 mg by mouth daily.        . pravastatin (PRAVACHOL) 20 MG tablet Take 20 mg by mouth daily.  No results found for this or any previous visit (from the past 48 hour(s)). No results found.  ROS  Blood pressure 149/75, pulse 66, temperature 97.8 F (36.6 C), temperature source Oral, resp. rate 18, height 6' (1.829 m), weight 225 lb (102.059 kg), SpO2 99.00%. Physical Exam  Constitutional: He appears well-developed and well-nourished.  HENT:  Mouth/Throat: Oropharynx is clear and moist.  Eyes: Conjunctivae are normal. No scleral icterus.  Neck: No thyromegaly present.  Cardiovascular: Normal rate, regular rhythm and normal heart sounds.   No murmur heard. Respiratory: Effort normal and breath sounds normal.  GI: Soft. He exhibits no distension and no mass.  Small umbilicus hernia. 6 mm of the cutaneous nodule at epigastrium to the right of midline possibly related to prior subcutaneous injection therapy  Musculoskeletal: He exhibits no edema.  Lymphadenopathy:    He has no cervical adenopathy.  Neurological: He is alert.   Skin: Skin is warm and dry.     Assessment/Plan History of colonic adenoma. Surveillance colonoscopy.  Grant Valdez 10/09/2012, 11:29 AM

## 2012-10-09 NOTE — Op Note (Addendum)
COLONOSCOPY PROCEDURE REPORT  PATIENT:  Grant Valdez  MR#:  [id number] Birthdate:  [birth year], 62 y.o., male Endoscopist:  Dr. Malissa Hippo, MD Referred By:  Dr. Ignatius Specking, MD Procedure Date: 10/09/2012  Procedure:   Colonoscopy with snare polypectomy.  Indications:  Patient is 62 year old Caucasian male with history of colonic adenoma. Last colonoscopy was in July 2007.  Informed Consent:  The procedure and risks were reviewed with the patient and informed consent was obtained.  Medications:  Demerol  50 mg IV Versed 7 mg IV  Description of procedure:  After a digital rectal exam was performed, that colonoscope was advanced from the anus through the rectum and colon to the area of the cecum, ileocecal valve and appendiceal orifice. The cecum was deeply intubated. These structures were well-seen and photographed for the record. From the level of the cecum and ileocecal valve, the scope was slowly and cautiously withdrawn. The mucosal surfaces were carefully surveyed utilizing scope tip to flexion to facilitate fold flattening as needed. The scope was pulled down into the rectum where a thorough exam including retroflexion was performed.  Findings:   Prep satisfactory. 10 mm broad-based polyp snared piecemeal from hepatic flexure. Most of the polyp was coagulated during this process. Both pieces of the polyp were submitted for histologic examination. Small polyp snared from rectosigmoid junction. Few small diverticula at sigmoid colon. Normal rectal mucosa. Small hemorrhoids below the dentate line.  Therapeutic/Diagnostic Maneuvers Performed:  See above  Complications:  None  Cecal Withdrawal Time:  16 minutes  Impression:  Examination performed to cecum. 10 mm broad-based polyp snared piecemeal from hepatic flexure. Most of the polyp was coagulated during this process but enough tissue obtained for histology. Small polyps snared from rectosigmoid junction. Mild sigmoid  colon diverticulosis. Small external hemorrhoids.  Recommendations:  Standard instructions given. I will contact patient with biopsy results and further recommendations.  Grant Valdez,Grant Valdez  10/09/2012 12:17 PM  CC: Dr. Ignatius Specking., MD & Dr. Bonnetta Barry ref. provider found

## 2012-10-11 ENCOUNTER — Encounter (HOSPITAL_COMMUNITY): Payer: Self-pay | Admitting: Internal Medicine

## 2012-10-23 ENCOUNTER — Encounter (INDEPENDENT_AMBULATORY_CARE_PROVIDER_SITE_OTHER): Payer: Self-pay | Admitting: *Deleted

## 2012-12-23 ENCOUNTER — Encounter: Payer: Self-pay | Admitting: Vascular Surgery

## 2012-12-24 ENCOUNTER — Other Ambulatory Visit (INDEPENDENT_AMBULATORY_CARE_PROVIDER_SITE_OTHER): Payer: BC Managed Care – PPO | Admitting: *Deleted

## 2012-12-24 ENCOUNTER — Ambulatory Visit (INDEPENDENT_AMBULATORY_CARE_PROVIDER_SITE_OTHER): Payer: BC Managed Care – PPO | Admitting: Vascular Surgery

## 2012-12-24 ENCOUNTER — Other Ambulatory Visit: Payer: Self-pay | Admitting: *Deleted

## 2012-12-24 ENCOUNTER — Encounter: Payer: Self-pay | Admitting: Vascular Surgery

## 2012-12-24 DIAGNOSIS — I6529 Occlusion and stenosis of unspecified carotid artery: Secondary | ICD-10-CM

## 2012-12-24 DIAGNOSIS — Z48812 Encounter for surgical aftercare following surgery on the circulatory system: Secondary | ICD-10-CM

## 2012-12-24 NOTE — Progress Notes (Signed)
Patient has today for followup of his right carotid endarterectomy in October 2013. He had left facial numbness which may have been related to this stenosis. He did high-grade stenosis an uneventful endarterectomy. He change to do well with no new neurologic deficits. He does have a history of MS. Denies any cardiac difficulties.  Past Medical History  Diagnosis Date  . MS (multiple sclerosis)     diagnosed in 1989. He was treated with prednisone followed by betaseron which was gradually tapered because his LFTs went up and it resolved off therapy. Likely he is remained in remission for years  . HLD (hyperlipidemia)   . S/P right inguinal herniorrhaphy   . TIA (transient ischemic attack)   . CAD (coronary artery disease)     had 2 arteries tented four years ago sees Dr. Andee Lineman  . HTN (hypertension)     Sees Dr. Sherril Croon  . Depression   . Anxiety   . GERD (gastroesophageal reflux disease)   . Headache     migraines hx of  . Neuromuscular disorder     has Multiple Sclerosis, sees Dr. Marjory Lies  . Stroke     "patient states has had several TIA's, last one 05/27/12"  . Carotid artery occlusion     History  Substance Use Topics  . Smoking status: Former Smoker    Quit date: 08/21/1993  . Smokeless tobacco: Never Used  . Alcohol Use: No    Family History  Problem Relation Age of Onset  . Diabetes Mother   . Hypertension Mother   . Heart attack Father     No Known Allergies  Current outpatient prescriptions:Aspirin-Dipyridamole (AGGRENOX PO), Take by mouth 2 (two) times daily., Disp: , Rfl: ;  citalopram (CELEXA) 20 MG tablet, Take 20 mg by mouth daily.  , Disp: , Rfl: ;  Dimethyl Fumarate (TECFIDERA) 240 MG CPDR, Take 1 capsule by mouth 2 (two) times daily., Disp: , Rfl: ;  ezetimibe (ZETIA) 10 MG tablet, Take 10 mg by mouth daily.  , Disp: , Rfl:  LORazepam (ATIVAN) 0.5 MG tablet, Take 0.5 mg by mouth 2 (two) times daily as needed. nerves, Disp: , Rfl: ;  metoprolol (LOPRESSOR) 50 MG  tablet, Take 25 mg by mouth 2 (two) times daily. , Disp: , Rfl: ;  Multiple Vitamins-Minerals (MULTIVITAL) tablet, Take 1 tablet by mouth daily.  , Disp: , Rfl: ;  Omega-3 Fatty Acids (FISH OIL) 1000 MG CAPS, Take 1 capsule by mouth 2 (two) times daily. , Disp: , Rfl:  omeprazole (PRILOSEC) 20 MG capsule, Take 20 mg by mouth daily.  , Disp: , Rfl: ;  pravastatin (PRAVACHOL) 20 MG tablet, Take 20 mg by mouth daily., Disp: , Rfl:   BP 143/91  Pulse 58  Resp 18  Ht 6' (1.829 m)  Wt 214 lb (97.07 kg)  BMI 29.02 kg/m2  Body mass index is 29.02 kg/(m^2).       Physical exam: Well-developed well-nourished gentleman in no acute distress Carotid arteries without bruits bilaterally. Well-healed right neck incision 2+ radial pulses bilaterally Heart regular rhythm without murmur Grossly intact neurologically  Carotid duplex today was interpreted reviewed with the patient. She was widely patent endarterectomy with no evidence of stenosis. He does have no significant left carotid stenosis  Impression and plan: Stable 6 month following right carotid endarterectomy. Will be seen again in 6 months for repeat carotid duplex and then dropped back a yearly at that point if everything is stable. He knows to notify  us should he develop any neurologic deficits

## 2013-01-08 ENCOUNTER — Telehealth: Payer: Self-pay | Admitting: Diagnostic Neuroimaging

## 2013-01-08 NOTE — Telephone Encounter (Signed)
Patient said that he is staggering, after getting up from sitting or standing, dragging his feet until he gets situated has been going on for 2 wks, has numbness in hands and when urinating , can't hold it like he use to, will leak. Will need an out of work  letter written for his job.

## 2013-01-09 ENCOUNTER — Telehealth: Payer: Self-pay | Admitting: Diagnostic Neuroimaging

## 2013-01-09 NOTE — Telephone Encounter (Signed)
Symptoms started about two plus  weeks ago. They include, sudden urination, unable to retain urine, no warning and will void suddenly on self. Decreased sensations in hands. Dropping items. Can't reach in pocket and feel items. Will reach in pocket, squeeze fingers together and may or may not have something when pulls hand out of pocket. Left lateral leg from knee to hip is numb. Will stagger if sitting for fifteen or more minutes and then stands and walks. Will stagger for about ten feet. If catches self he can correct himself, stop and then he's okay. On a few occasions over past few weeks he has nearly fallen. Last seen in Sep 30, 2012. Next appointment scheduled for April 09, 2013. Do you want to speak with him or see him sooner? Su Ley BS RN

## 2013-01-15 NOTE — Telephone Encounter (Signed)
Have patient come in Thursday at afternoon or Friday AM (double book) for brief visit.  -VRP

## 2013-01-17 ENCOUNTER — Encounter: Payer: Self-pay | Admitting: Diagnostic Neuroimaging

## 2013-01-17 ENCOUNTER — Ambulatory Visit (INDEPENDENT_AMBULATORY_CARE_PROVIDER_SITE_OTHER): Payer: BC Managed Care – PPO | Admitting: Diagnostic Neuroimaging

## 2013-01-17 VITALS — BP 130/73 | HR 55 | Temp 97.0°F | Ht 72.0 in | Wt 214.0 lb

## 2013-01-17 DIAGNOSIS — F32A Depression, unspecified: Secondary | ICD-10-CM | POA: Insufficient documentation

## 2013-01-17 DIAGNOSIS — F329 Major depressive disorder, single episode, unspecified: Secondary | ICD-10-CM | POA: Insufficient documentation

## 2013-01-17 DIAGNOSIS — F419 Anxiety disorder, unspecified: Secondary | ICD-10-CM | POA: Insufficient documentation

## 2013-01-17 DIAGNOSIS — G35 Multiple sclerosis: Secondary | ICD-10-CM

## 2013-01-17 DIAGNOSIS — F3289 Other specified depressive episodes: Secondary | ICD-10-CM

## 2013-01-17 DIAGNOSIS — F411 Generalized anxiety disorder: Secondary | ICD-10-CM

## 2013-01-17 NOTE — Patient Instructions (Signed)
I will check MRI and labs.  I will refer to psychiatry.

## 2013-01-17 NOTE — Progress Notes (Signed)
[facility]  PATIENT: Grant Valdez DOB: 04-Apr-1951  REFERRING CLINICIAN:  HISTORY FROM: patient and wife REASON FOR VISIT: urgent follow up   HISTORICAL  CHIEF COMPLAINT:  Chief Complaint  Patient presents with  . Follow-up    MS    HISTORY OF PRESENT ILLNESS:   UPDATE 01/17/13: patient returns for urgent followup. Over past 3 weeks, patient has had increasing dizziness, balance difficulty, urination control problems. He feels some additional numbness or weakness in his legs. No difficulty with his hands or arms. No vision changes, slurred speech, trouble talking. Wife notes some more mood swings, irritability, depression, anxiety, attention and focus problems. He also has had more difficulty with hearing.  UPDATE 09/30/12: Since last visit, doing well. Started tecfidera on Jul 21, 2012. Initially flushing and GI sxs, but have eased up. No new MS flares or sxs.  UPDATE 06/19/12: S/p right CEA on 06/10/12. No complications. No new stroke/TIA symptoms. WIll proceed with MS treatment.   UPDATE 05/01/12: Since last visit, had MRI brain, c and t spine, confirming chronic MS. On 04/08/12, had sudden onset dizziness, seeing floaters, and left face arm leg numbness x 10 minutes. Didn't tell anyone for 4 days. Had full recovery. Today carotid u/s shows right ICA stenosis (50-69%). Also with fatigue, malaise. Thinking about disability.   PRIOR HPI: 62 year old right-handed male with history of hypertension, hypercholesteremia, coronary artery disease, depression, anxiety, multiple sclerosis, TIA, here for evaluation and management of MS.  In 1989, patient developed numbness spots in the back. He was evaluated by Dr. Noreene Valdez ([facility]), with MRI and lumbar puncture, and given a diagnosis of MS. We do not have those records to review today. 2 years later he transferred care to Dr. Stoney Valdez [facility]), who treated him with prednisone. Over these years she developed some  staggering, balance difficulty, vision problems. In 1995 he was started on Betaseron. Over the next 10-15 years he was on and off of Betaseron, do to fluctuation in liver function testing.  Patient last saw Dr. Stoney Valdez in 2011. He saw a different neurologist at [facility] in 2012. Since that time he has not seen a neurologist. His last MRI was around 2000 102,011, which is apparently stable. He weaned himself off of Betaseron in 2011 because he felt that his symptoms weren't fairly mild.  In 2013 he is started to develop increasing fatigue, balance difficulty, numbness in his hands and feet. Also having trouble with coordination.  REVIEW OF SYSTEMS: Full 14 system review of systems performed and notable only for weight loss fatigue hearing loss ringing in ears spinning sensation itching incontinence urination problems incontinence blurred vision feeling hot joint pain aching muscles depression anxiety too much sleep decreased energy change in appetite dizziness slurred speech numbness weakness confusion memory loss.  ALLERGIES: No Known Allergies  HOME MEDICATIONS: Outpatient Prescriptions Prior to Visit  Medication Sig Dispense Refill  . Aspirin-Dipyridamole (AGGRENOX PO) Take by mouth 2 (two) times daily.      . citalopram (CELEXA) 20 MG tablet Take 20 mg by mouth daily.        . Dimethyl Fumarate (TECFIDERA) 240 MG CPDR Take 1 capsule by mouth 2 (two) times daily.      Marland Kitchen ezetimibe (ZETIA) 10 MG tablet Take 10 mg by mouth daily.        Marland Kitchen LORazepam (ATIVAN) 0.5 MG tablet Take 0.5 mg by mouth 2 (two) times daily as needed. nerves      . metoprolol (LOPRESSOR) 50  MG tablet Take 25 mg by mouth 2 (two) times daily.       . Multiple Vitamins-Minerals (MULTIVITAL) tablet Take 1 tablet by mouth daily.        . Omega-3 Fatty Acids (FISH OIL) 1000 MG CAPS Take 1 capsule by mouth 2 (two) times daily.       Marland Kitchen omeprazole (PRILOSEC) 20 MG capsule Take 20 mg by mouth daily.        . pravastatin  (PRAVACHOL) 20 MG tablet Take 20 mg by mouth daily.       No facility-administered medications prior to visit.    PAST MEDICAL HISTORY: Past Medical History  Diagnosis Date  . MS (multiple sclerosis)     diagnosed in 1989. He was treated with prednisone followed by betaseron which was gradually tapered because his LFTs went up and it resolved off therapy. Likely he is remained in remission for years  . HLD (hyperlipidemia)   . S/P right inguinal herniorrhaphy   . TIA (transient ischemic attack)   . CAD (coronary artery disease)     had 2 arteries tented four years ago sees Dr. Andee Valdez  . HTN (hypertension)     Sees Dr. Sherril Valdez  . Depression   . Anxiety   . GERD (gastroesophageal reflux disease)   . Headache(784.0)     migraines hx of  . Neuromuscular disorder     has Multiple Sclerosis, sees Dr. Marjory Valdez  . Stroke     "patient states has had several TIA's, last one 05/27/12"  . Carotid artery occlusion   . Diabetes mellitus without complication     PAST SURGICAL HISTORY: Past Surgical History  Procedure Laterality Date  . Colonoscopy  March 12, 2006    Had 2 polyps removed; one was an adenoma and the other one was hyperplastic  . Stents    . Varicose removal from scrotum  1982  . Hydrocele excision  2011  . Vasectomy  1989  . Hernia repair  age 13  . Endarterectomy  06/10/2012    Procedure: ENDARTERECTOMY CAROTID;  Surgeon: Grant Earthly, MD;  Location: [facility] OR;  Service: Vascular;  Laterality: Right;  With Dacron Patch Angioplasty  . Carotid endarterectomy    . Colonoscopy N/A 10/09/2012    Procedure: COLONOSCOPY;  Surgeon: Grant Hippo, MD;  Location: [facility];  Service: Endoscopy;  Laterality: N/A;  1030-rescheduled to 12:00pm per Grant Valdez    FAMILY HISTORY: Family History  Problem Relation Age of Onset  . Diabetes Mother   . Hypertension Mother   . Heart attack Father   . Lung cancer Father     SOCIAL HISTORY:  History   Social History  . Marital Status:  Married    Spouse Name: Grant Valdez    Number of Children: 2  . Years of Education: 28yrs   Occupational History  .      [employer]   Social History Main Topics  . Smoking status: Former Smoker -- 2.00 packs/day for 20 years    Quit date: 08/21/1993  . Smokeless tobacco: Never Used  . Alcohol Use: No  . Drug Use: No  . Sexually Active: Not on file   Other Topics Concern  . Not on file   Social History Narrative   Full time. Married. Works at [employer].    Pt lives at home with his spouse.   Caffeine Use: none     PHYSICAL EXAM  Filed Vitals:   01/17/13 1110  BP: 130/73  Pulse: 55  Temp: 97 F (36.1 C)  TempSrc: Oral  Height: 6' (1.829 m)  Weight: 214 lb (97.07 kg)    Not recorded    Body mass index is 29.02 kg/(m^2).  GENERAL EXAM: Patient is in no distress  CARDIOVASCULAR: Regular rate and rhythm, no murmurs, no carotid bruits  NEUROLOGIC: MENTAL STATUS: awake, alert, language fluent, comprehension intact, naming intact CRANIAL NERVE: pupils equal and reactive to light, visual fields full to confrontation, extraocular muscles intact, no nystagmus, facial sensation and strength symmetric, uvula midline, shoulder shrug symmetric, tongue midline. MOTOR: normal bulk and tone, full strength in the BUE; BLE 4 SENSORY: ABSENT VIB IN FEET; DECR SENS IN ANKLES AND FINGERS. COORDINATION: finger-nose-finger, fine finger movements normal REFLEXES: BUE 2, KNEES 3, ANKLES 2.  GAIT/STATION: ATAXIC GAIT. CANNOT TANDEM, TOE OR HEEL. POSITIVE ROMBERG.   DIAGNOSTIC DATA (LABS, IMAGING, TESTING) - I reviewed patient records, labs, notes, testing and imaging myself where available.  Lab Results  Component Value Date   WBC 9.6 06/11/2012   HGB 14.3 06/11/2012   HCT 41.0 06/11/2012   MCV 94.3 06/11/2012   PLT 145* 06/11/2012      Component Value Date/Time   NA 137 06/11/2012 0400   K 4.0 06/11/2012 0400   CL 100 06/11/2012 0400   CO2 24 06/11/2012  0400   GLUCOSE 126* 06/11/2012 0400   BUN 9 06/11/2012 0400   CREATININE 0.79 06/11/2012 0400   CALCIUM 8.9 06/11/2012 0400   PROT 7.5 05/28/2012 1126   ALBUMIN 4.2 05/28/2012 1126   AST 45* 05/28/2012 1126   ALT 53 05/28/2012 1126   ALKPHOS 57 05/28/2012 1126   BILITOT 0.5 05/28/2012 1126   GFRNONAA >90 06/11/2012 0400   GFRAA >90 06/11/2012 0400   No results found for this basename: CHOL, HDL, LDLCALC, LDLDIRECT, TRIG, CHOLHDL   No results found for this basename: HGBA1C   No results found for this basename: VITAMINB12   No results found for this basename: TSH       ASSESSMENT AND PLAN  62 y.o. male with history of multiple sclerosis, hypertension, hypercholesterolemia, coronary artery disease, TIA.  Had right brain TIA on 04/08/12, with right ICA stenosis, s/p right CEA 06/10/12.  Was doing well on tecfidera, but now getting worse in last 3 weeks. Could be MS exacerbation. However, he reports being steroid intolerant in past (hallucinations, stomach irritation, weight gain).  PLAN: 1. continue stroke prevention with aggrenox, statin, BP control per PCP 2. continue tecfidera for multiple sclerosis 3. annual CBC 4. Will check MRIs, labs and refer to psychiatry 5. May need to consider switch of tecfidera after MRI results  Orders Placed This Encounter  Procedures  . MR Brain W Wo Contrast  . MR Thoracic Spine W Wo Contrast  . Vitamin B12  . TSH  . Hemoglobin A1c  . CBC with Differential  . CMP  . Ambulatory referral to Psychiatry    Suanne Marker, MD 01/17/2013, 12:03 PM Certified in Neurology, Neurophysiology and Neuroimaging  [facility] [address] [place], [place] [address] [phone]

## 2013-01-18 LAB — CBC WITH DIFFERENTIAL/PLATELET
Basophils Absolute: 0 10*3/uL (ref 0.0–0.2)
Eos: 2 % (ref 0–5)
HCT: 47.2 % (ref 37.5–51.0)
Hemoglobin: 16.1 g/dL (ref 12.6–17.7)
Lymphs: 18 % (ref 14–46)
MCHC: 34.1 g/dL (ref 31.5–35.7)
Monocytes: 12 % (ref 4–12)
Neutrophils Absolute: 3.4 10*3/uL (ref 1.4–7.0)
RBC: 4.99 x10E6/uL (ref 4.14–5.80)
WBC: 5.1 10*3/uL (ref 3.4–10.8)

## 2013-01-18 LAB — COMPREHENSIVE METABOLIC PANEL
Albumin: 4.8 g/dL (ref 3.5–5.5)
BUN: 10 mg/dL (ref 6–24)
CO2: 24 mmol/L (ref 19–28)
Chloride: 100 mmol/L (ref 97–108)
Creatinine, Ser: 0.84 mg/dL (ref 0.76–1.27)
GFR calc Af Amer: 111 mL/min/{1.73_m2} (ref >59)
Globulin, Total: 2.5 g/dL (ref 1.5–4.5)
Glucose: 94 mg/dL (ref 65–99)
Total Bilirubin: 0.7 mg/dL (ref 0.0–1.2)
Total Protein: 7.3 g/dL (ref 6.0–8.5)

## 2013-01-18 LAB — HEMOGLOBIN A1C
Est. average glucose Bld gHb Est-mCnc: 108 mg/dL
Hgb A1c MFr Bld: 5.4 % (ref 4.8–5.6)

## 2013-01-20 NOTE — Progress Notes (Signed)
Quick Note:  I called and spoke with the patient concerning his lab results being normal. ______

## 2013-01-21 ENCOUNTER — Telehealth: Payer: Self-pay | Admitting: Diagnostic Neuroimaging

## 2013-01-27 DIAGNOSIS — I6529 Occlusion and stenosis of unspecified carotid artery: Secondary | ICD-10-CM

## 2013-01-31 ENCOUNTER — Ambulatory Visit
Admission: RE | Admit: 2013-01-31 | Discharge: 2013-01-31 | Disposition: A | Discharge status: Home / Self Care | Payer: BC Managed Care – PPO | Source: Ambulatory Visit | Attending: Diagnostic Neuroimaging | Admitting: Diagnostic Neuroimaging

## 2013-01-31 DIAGNOSIS — F419 Anxiety disorder, unspecified: Secondary | ICD-10-CM

## 2013-01-31 DIAGNOSIS — F32A Depression, unspecified: Secondary | ICD-10-CM

## 2013-01-31 DIAGNOSIS — G35 Multiple sclerosis: Secondary | ICD-10-CM

## 2013-01-31 DIAGNOSIS — F329 Major depressive disorder, single episode, unspecified: Secondary | ICD-10-CM

## 2013-01-31 MED ORDER — GADOBENATE DIMEGLUMINE 529 MG/ML IV SOLN
20.0000 mL | Freq: Once | INTRAVENOUS | Status: AC | PRN
  Administered 2013-01-31: 20 mL via INTRAVENOUS

## 2013-04-01 ENCOUNTER — Telehealth: Payer: Self-pay | Admitting: Diagnostic Neuroimaging

## 2013-04-01 NOTE — Telephone Encounter (Signed)
Pt's wife called to r/s pt w/Dr. Marjory Lies. She would also like for a nurse to call her about her husband's MRI results. Thanks

## 2013-04-01 NOTE — Telephone Encounter (Signed)
I called pt and relayed MRI results.   He had to reschedule appt due to MD's change.  I offered appt with LL/NP to be seen sooner, since he relayed has been having falls (4 since last seen, in July).  He stated he would keep the same.   He would call if needed sooner.

## 2013-04-09 ENCOUNTER — Ambulatory Visit: Payer: Self-pay | Admitting: Diagnostic Neuroimaging

## 2013-05-12 ENCOUNTER — Ambulatory Visit: Payer: Self-pay | Admitting: Diagnostic Neuroimaging

## 2013-06-17 ENCOUNTER — Ambulatory Visit (INDEPENDENT_AMBULATORY_CARE_PROVIDER_SITE_OTHER): Payer: BC Managed Care – PPO | Admitting: Diagnostic Neuroimaging

## 2013-06-17 ENCOUNTER — Encounter: Payer: Self-pay | Admitting: Diagnostic Neuroimaging

## 2013-06-17 ENCOUNTER — Encounter (INDEPENDENT_AMBULATORY_CARE_PROVIDER_SITE_OTHER): Payer: Self-pay

## 2013-06-17 VITALS — BP 120/70 | HR 50 | Temp 97.5°F | Ht 72.0 in | Wt 221.0 lb

## 2013-06-17 DIAGNOSIS — R269 Unspecified abnormalities of gait and mobility: Secondary | ICD-10-CM

## 2013-06-17 DIAGNOSIS — G35D Multiple sclerosis, unspecified: Secondary | ICD-10-CM

## 2013-06-17 DIAGNOSIS — G35 Multiple sclerosis: Secondary | ICD-10-CM

## 2013-06-17 NOTE — Progress Notes (Signed)
[facility]  PATIENT: Grant Valdez DOB: Nov 19, 1950  REFERRING CLINICIAN:  HISTORY FROM: patient and wife REASON FOR VISIT: urgent follow up   HISTORICAL  CHIEF COMPLAINT:  Chief Complaint  Patient presents with  . Follow-up    MS    HISTORY OF PRESENT ILLNESS:   UPDATE 06/17/13: Patient presents for followup. Since last visit he has fallen 4 times including breaking his left foot, almost 4 months ago. Otherwise patient is stable. He still has issues with fatigue depression anxiety.  UPDATE 01/17/13: patient returns for urgent followup. Over past 3 weeks, patient has had increasing dizziness, balance difficulty, urination control problems. He feels some additional numbness or weakness in his legs. No difficulty with his hands or arms. No vision changes, slurred speech, trouble talking. Wife notes some more mood swings, irritability, depression, anxiety, attention and focus problems. He also has had more difficulty with hearing.  UPDATE 09/30/12: Since last visit, doing well. Started tecfidera on Jul 21, 2012. Initially flushing and GI sxs, but have eased up. No new MS flares or sxs.  UPDATE 06/19/12: S/p right CEA on 06/10/12. No complications. No new stroke/TIA symptoms. WIll proceed with MS treatment.   UPDATE 05/01/12: Since last visit, had MRI brain, c and t spine, confirming chronic MS. On 04/08/12, had sudden onset dizziness, seeing floaters, and left face arm leg numbness x 10 minutes. Didn't tell anyone for 4 days. Had full recovery. Today carotid u/s shows right ICA stenosis (50-69%). Also with fatigue, malaise. Thinking about disability.   PRIOR HPI: 62 year old right-handed male with history of hypertension, hypercholesteremia, coronary artery disease, depression, anxiety, multiple sclerosis, TIA, here for evaluation and management of MS.  In 1989, patient developed numbness spots in the back. He was evaluated by Dr. Noreene Filbert ([facility]), with MRI and lumbar  puncture, and given a diagnosis of MS. We do not have those records to review today. 2 years later he transferred care to Dr. Stoney [facility]), who treated him with prednisone. Over these years she developed some staggering, balance difficulty, vision problems. In 1995 he was started on Betaseron. Over the next 10-15 years he was on and off of Betaseron, do to fluctuation in liver function testing.  Patient last saw Dr. Stoney Bang in 2011. He saw a different neurologist at [facility] in 2012. Since that time he has not seen a neurologist. His last MRI was around 2000 102,011, which is apparently stable. He weaned himself off of Betaseron in 2011 because he felt that his symptoms weren't fairly mild.  In 2013 he is started to develop increasing fatigue, balance difficulty, numbness in his hands and feet. Also having trouble with coordination.  REVIEW OF SYSTEMS: Full 14 system review of systems performed and notable only for weight loss fatigue hearing loss ringing in ears spinning sensation itching incontinence urination problems incontinence blurred vision feeling hot joint pain aching muscles depression anxiety too much sleep decreased energy change in appetite dizziness slurred speech numbness weakness confusion memory loss.  ALLERGIES: No Known Allergies  HOME MEDICATIONS: Outpatient Prescriptions Prior to Visit  Medication Sig Dispense Refill  . Aspirin-Dipyridamole (AGGRENOX PO) Take by mouth 2 (two) times daily.      . citalopram (CELEXA) 20 MG tablet Take 20 mg by mouth daily.        . Dimethyl Fumarate (TECFIDERA) 240 MG CPDR Take 1 capsule by mouth 2 (two) times daily.      Marland Kitchen ezetimibe (ZETIA) 10 MG tablet Take 10 mg by  mouth daily.        Marland Kitchen LORazepam (ATIVAN) 0.5 MG tablet Take 0.5 mg by mouth 2 (two) times daily as needed. nerves      . metoprolol (LOPRESSOR) 50 MG tablet Take 25 mg by mouth 2 (two) times daily.       . Multiple Vitamins-Minerals (MULTIVITAL) tablet Take 1  tablet by mouth daily.        . Omega-3 Fatty Acids (FISH OIL) 1000 MG CAPS Take 1 capsule by mouth 2 (two) times daily.       Marland Kitchen omeprazole (PRILOSEC) 20 MG capsule Take 20 mg by mouth daily.        . pravastatin (PRAVACHOL) 20 MG tablet Take 20 mg by mouth daily.       No facility-administered medications prior to visit.    PAST MEDICAL HISTORY: Past Medical History  Diagnosis Date  . MS (multiple sclerosis)     diagnosed in 1989. He was treated with prednisone followed by betaseron which was gradually tapered because his LFTs went up and it resolved off therapy. Likely he is remained in remission for years  . HLD (hyperlipidemia)   . S/P right inguinal herniorrhaphy   . TIA (transient ischemic attack)   . CAD (coronary artery disease)     had 2 arteries tented four years ago sees Dr. Andee Lineman  . HTN (hypertension)     Sees Dr. Sherril Croon  . Depression   . Anxiety   . GERD (gastroesophageal reflux disease)   . Headache(784.0)     migraines hx of  . Neuromuscular disorder     has Multiple Sclerosis, sees Dr. Marjory Lies  . Stroke     "patient states has had several TIA's, last one 05/27/12"  . Carotid artery occlusion   . Diabetes mellitus without complication     PAST SURGICAL HISTORY: Past Surgical History  Procedure Laterality Date  . Colonoscopy  March 12, 2006    Had 2 polyps removed; one was an adenoma and the other one was hyperplastic  . Stents    . Varicose removal from scrotum  1982  . Hydrocele excision  2011  . Vasectomy  1989  . Hernia repair  age 63  . Endarterectomy  06/10/2012    Procedure: ENDARTERECTOMY CAROTID;  Surgeon: Larina Earthly, MD;  Location: [facility] OR;  Service: Vascular;  Laterality: Right;  With Dacron Patch Angioplasty  . Carotid endarterectomy    . Colonoscopy N/A 10/09/2012    Procedure: COLONOSCOPY;  Surgeon: Malissa Hippo, MD;  Location: [facility];  Service: Endoscopy;  Laterality: N/A;  1030-rescheduled to 12:00pm per Dewayne Hatch    FAMILY  HISTORY: Family History  Problem Relation Age of Onset  . Diabetes Mother   . Hypertension Mother   . Heart attack Father   . Lung cancer Father     SOCIAL HISTORY:  History   Social History  . Marital Status: Married    Spouse Name: Sante Biedermann    Number of Children: 2  . Years of Education: 9yrs   Occupational History  .      [employer]   Social History Main Topics  . Smoking status: Former Smoker -- 2.00 packs/day for 20 years    Quit date: 08/21/1993  . Smokeless tobacco: Never Used  . Alcohol Use: No  . Drug Use: No  . Sexual Activity: Not on file   Other Topics Concern  . Not on file   Social History Narrative  Full time. Married. Works at [employer].    Pt lives at home with his spouse.   Caffeine Use: none     PHYSICAL EXAM  Filed Vitals:   06/17/13 1136  BP: 120/70  Pulse: 50  Temp: 97.5 F (36.4 C)  TempSrc: Oral  Height: 6' (1.829 m)  Weight: 221 lb (100.245 kg)    Not recorded    Body mass index is 29.97 kg/(m^2).  GENERAL EXAM: Patient is in no distress  CARDIOVASCULAR: Regular rate and rhythm, no murmurs, no carotid bruits  NEUROLOGIC: MENTAL STATUS: awake, alert, language fluent, comprehension intact, naming intact CRANIAL NERVE: pupils equal and reactive to light, visual fields full to confrontation, extraocular muscles intact, no nystagmus, facial sensation and strength symmetric, uvula midline, shoulder shrug symmetric, tongue midline. MOTOR: normal bulk and tone, full strength in the BUE; BLE 4 SENSORY: ABSENT VIB IN FEET; DECR SENS IN ANKLES AND FINGERS. COORDINATION: finger-nose-finger, fine finger movements normal REFLEXES: BUE 2, KNEES 3, ANKLES 2.  GAIT/STATION: ATAXIC GAIT. CANNOT TANDEM, TOE OR HEEL. POSITIVE ROMBERG.   DIAGNOSTIC DATA (LABS, IMAGING, TESTING) - I reviewed patient records, labs, notes, testing and imaging myself where available.  Lab Results  Component Value Date   WBC 5.1  01/17/2013   HGB 16.1 01/17/2013   HCT 47.2 01/17/2013   MCV 95 01/17/2013   PLT 145* 06/11/2012      Component Value Date/Time   NA 139 01/17/2013 1422   NA 137 06/11/2012 0400   K 4.5 01/17/2013 1422   CL 100 01/17/2013 1422   CO2 24 01/17/2013 1422   GLUCOSE 94 01/17/2013 1422   GLUCOSE 126* 06/11/2012 0400   BUN 10 01/17/2013 1422   BUN 9 06/11/2012 0400   CREATININE 0.84 01/17/2013 1422   CALCIUM 9.7 01/17/2013 1422   PROT 7.3 01/17/2013 1422   PROT 7.5 05/28/2012 1126   ALBUMIN 4.2 05/28/2012 1126   AST 36 01/17/2013 1422   ALT 35 01/17/2013 1422   ALKPHOS 55 01/17/2013 1422   BILITOT 0.7 01/17/2013 1422   GFRNONAA 96 01/17/2013 1422   GFRAA 111 01/17/2013 1422   No results found for this basename: CHOL,  HDL,  LDLCALC,  LDLDIRECT,  TRIG,  CHOLHDL   Lab Results  Component Value Date   HGBA1C 5.4 01/17/2013   Lab Results  Component Value Date   VITAMINB12 306 01/17/2013   Lab Results  Component Value Date   TSH 2.320 01/17/2013    02/06/13 MRI brain 1. Multiple periventricular and subcortical chronic demyelinating plaques. Chronic plaque noted in the right medulla. 2. No acute plaques. 3. No change from MRI on 04/02/12.   ASSESSMENT AND PLAN  62 y.o. male with history of multiple sclerosis, hypertension, hypercholesterolemia, coronary artery disease, TIA.  Had right brain TIA on 04/08/12, with right ICA stenosis, s/p right CEA 06/10/12.  Was doing well on tecfidera, but now getting worse in last 3 weeks. Could be MS exacerbation. However, he reports being steroid intolerant in past (hallucinations, stomach irritation, weight gain).  PLAN: 1. continue stroke prevention with aggrenox, statin, BP control per PCP 2. continue tecfidera for multiple sclerosis 3. Repeat labs 4. PT referral 5. handicap tag form signed  Orders Placed This Encounter  Procedures  . CBC With differential/Platelet  . Comprehensive metabolic panel  . Ambulatory referral to Physical Therapy    Return  in about 3 months (around 09/17/2013) for with Heide Guile or [name].    Suanne Marker, MD 06/17/2013,  12:45 PM Certified in Neurology, Neurophysiology and Neuroimaging  [facility] [address], [address] [place], [place] [address] [phone]

## 2013-06-17 NOTE — Patient Instructions (Signed)
I will check labs and refer you to physical therapy.

## 2013-06-18 LAB — COMPREHENSIVE METABOLIC PANEL
Albumin: 4.7 g/dL (ref 3.6–4.8)
BUN: 12 mg/dL (ref 8–27)
CO2: 29 mmol/L (ref 18–29)
Calcium: 10.1 mg/dL (ref 8.6–10.2)
Chloride: 102 mmol/L (ref 96–108)
Creatinine, Ser: 0.98 mg/dL (ref 0.76–1.27)
Globulin, Total: 2.7 g/dL (ref 1.5–4.5)
Glucose: 94 mg/dL (ref 65–99)
Total Protein: 7.4 g/dL (ref 6.0–8.5)

## 2013-06-18 LAB — CBC WITH DIFFERENTIAL
Basophils Absolute: 0 10*3/uL (ref 0.0–0.2)
Basos: 0 %
Eosinophils Absolute: 0.1 10*3/uL (ref 0.0–0.4)
HCT: 45 % (ref 37.5–51.0)
MCV: 95 fL (ref 79–97)
Monocytes Absolute: 0.6 10*3/uL (ref 0.1–0.9)
RBC: 4.74 x10E6/uL (ref 4.14–5.80)
WBC: 4.8 10*3/uL (ref 3.4–10.8)

## 2013-06-26 ENCOUNTER — Other Ambulatory Visit: Payer: Self-pay

## 2013-06-26 MED ORDER — DIMETHYL FUMARATE 240 MG PO CPDR: 1.0000 | DELAYED_RELEASE_CAPSULE | Freq: Two times a day (BID) | ORAL | Status: DC

## 2013-06-30 ENCOUNTER — Other Ambulatory Visit: Payer: Self-pay | Admitting: Diagnostic Neuroimaging

## 2013-06-30 ENCOUNTER — Encounter: Payer: Self-pay | Admitting: Vascular Surgery

## 2013-06-30 NOTE — Telephone Encounter (Signed)
Pt's prescription was faxed over to [facility]idera pharmacy at [phone]. °

## 2013-07-01 ENCOUNTER — Ambulatory Visit (HOSPITAL_COMMUNITY)
Admission: RE | Admit: 2013-07-01 | Discharge: 2013-07-01 | Disposition: A | Discharge status: Home / Self Care | Payer: BC Managed Care – PPO | Source: Ambulatory Visit | Attending: Vascular Surgery | Admitting: Vascular Surgery

## 2013-07-01 ENCOUNTER — Encounter: Payer: Self-pay | Admitting: Vascular Surgery

## 2013-07-01 ENCOUNTER — Ambulatory Visit (INDEPENDENT_AMBULATORY_CARE_PROVIDER_SITE_OTHER): Payer: BC Managed Care – PPO | Admitting: Vascular Surgery

## 2013-07-01 DIAGNOSIS — Z48812 Encounter for surgical aftercare following surgery on the circulatory system: Secondary | ICD-10-CM

## 2013-07-01 DIAGNOSIS — I6529 Occlusion and stenosis of unspecified carotid artery: Secondary | ICD-10-CM

## 2013-07-01 NOTE — Progress Notes (Signed)
Patient has today for followup of right carotid endarterectomy in October of 2013. He looks quite good today. He has had progression of MS and has had to go out of disability. He reports that he has had several falls. He denies any focal neurologic deficits.  Past Medical History  Diagnosis Date  . MS (multiple sclerosis)     diagnosed in 1989. He was treated with prednisone followed by betaseron which was gradually tapered because his LFTs went up and it resolved off therapy. Likely he is remained in remission for years  . HLD (hyperlipidemia)   . S/P right inguinal herniorrhaphy   . TIA (transient ischemic attack)   . CAD (coronary artery disease)     had 2 arteries tented four years ago sees Dr. Andee Lineman  . HTN (hypertension)     Sees Dr. Sherril Croon  . Depression   . Anxiety   . GERD (gastroesophageal reflux disease)   . Headache(784.0)     migraines hx of  . Neuromuscular disorder     has Multiple Sclerosis, sees Dr. Marjory Lies  . Stroke     "patient states has had several TIA's, last one 05/27/12"  . Carotid artery occlusion   . Diabetes mellitus without complication     History  Substance Use Topics  . Smoking status: Former Smoker -- 2.00 packs/day for 20 years    Types: Cigarettes    Quit date: 08/21/1993  . Smokeless tobacco: Never Used  . Alcohol Use: No    Family History  Problem Relation Age of Onset  . Diabetes Mother   . Hypertension Mother   . Heart attack Father   . Lung cancer Father     No Known Allergies  Current outpatient prescriptions:Aspirin-Dipyridamole (AGGRENOX PO), Take by mouth 2 (two) times daily., Disp: , Rfl: ;  buPROPion (WELLBUTRIN XL) 300 MG 24 hr tablet, Take 300 mg by mouth daily., Disp: , Rfl: ;  citalopram (CELEXA) 20 MG tablet, Take 20 mg by mouth daily.  , Disp: , Rfl: ;  Dimethyl Fumarate (TECFIDERA) 240 MG CPDR, Take 1 capsule (240 mg total) by mouth 2 (two) times daily., Disp: 180 capsule, Rfl: 3 ezetimibe (ZETIA) 10 MG tablet, Take 10 mg  by mouth daily.  , Disp: , Rfl: ;  LORazepam (ATIVAN) 0.5 MG tablet, Take 0.5 mg by mouth 2 (two) times daily as needed. nerves, Disp: , Rfl: ;  metoprolol (LOPRESSOR) 50 MG tablet, Take 25 mg by mouth 2 (two) times daily. , Disp: , Rfl: ;  Multiple Vitamins-Minerals (MULTIVITAL) tablet, Take 1 tablet by mouth daily.  , Disp: , Rfl:  Omega-3 Fatty Acids (FISH OIL) 1000 MG CAPS, Take 1 capsule by mouth 2 (two) times daily. , Disp: , Rfl: ;  omeprazole (PRILOSEC) 20 MG capsule, Take 20 mg by mouth daily.  , Disp: , Rfl: ;  pravastatin (PRAVACHOL) 20 MG tablet, Take 20 mg by mouth daily., Disp: , Rfl:   BP 127/81  Pulse 60  Resp 18  Ht 6' (1.829 m)  Wt 226 lb (102.513 kg)  BMI 30.64 kg/m2  Body mass index is 30.64 kg/(m^2).       Physical exam: Well-developed well-nourished gentleman in no acute distress Right neck incision is well-healed with some peri-incisional numbness No carotid bruits bilaterally Radial pulses are 2+ bilaterally Neurologically he is grossly intact Respirations equal and nonlabored  Duplex carotids today revealed widely patent endarterectomy on the right and no significant stenosis on the left.   Impression and  plan stable one year status post right carotid endarterectomy. We'll continue usual activities will see [place] again in one year with carotid duplex followup

## 2013-07-02 NOTE — Addendum Note (Signed)
Addended by: Sharee Pimple on: 07/02/2013 08:24 AM   Modules accepted: Orders

## 2013-07-13 ENCOUNTER — Other Ambulatory Visit: Payer: Self-pay

## 2013-07-13 MED ORDER — DIMETHYL FUMARATE 240 MG PO CPDR: 1.0000 | DELAYED_RELEASE_CAPSULE | Freq: Two times a day (BID) | ORAL | Status: DC

## 2013-09-19 ENCOUNTER — Ambulatory Visit: Payer: BC Managed Care – PPO | Admitting: Nurse Practitioner

## 2013-12-19 ENCOUNTER — Other Ambulatory Visit: Payer: Self-pay

## 2013-12-19 MED ORDER — DIMETHYL FUMARATE 240 MG PO CPDR: 1.0000 | DELAYED_RELEASE_CAPSULE | Freq: Two times a day (BID) | ORAL | Status: DC

## 2014-06-18 ENCOUNTER — Other Ambulatory Visit: Payer: Self-pay | Admitting: Diagnostic Neuroimaging

## 2014-07-13 ENCOUNTER — Encounter: Payer: Self-pay | Admitting: Family

## 2014-07-14 ENCOUNTER — Ambulatory Visit (HOSPITAL_COMMUNITY): Payer: BC Managed Care – PPO

## 2014-07-14 ENCOUNTER — Ambulatory Visit: Payer: BC Managed Care – PPO | Admitting: Family

## 2014-07-14 ENCOUNTER — Encounter: Payer: Self-pay | Admitting: Nurse Practitioner

## 2014-07-14 ENCOUNTER — Ambulatory Visit (INDEPENDENT_AMBULATORY_CARE_PROVIDER_SITE_OTHER): Payer: BC Managed Care – PPO | Admitting: Nurse Practitioner

## 2014-07-14 VITALS — BP 135/83 | HR 61 | Ht 72.0 in | Wt 240.6 lb

## 2014-07-14 DIAGNOSIS — R269 Unspecified abnormalities of gait and mobility: Secondary | ICD-10-CM

## 2014-07-14 DIAGNOSIS — R202 Paresthesia of skin: Secondary | ICD-10-CM

## 2014-07-14 DIAGNOSIS — G35 Multiple sclerosis: Secondary | ICD-10-CM

## 2014-07-14 DIAGNOSIS — G35D Multiple sclerosis, unspecified: Secondary | ICD-10-CM

## 2014-07-14 DIAGNOSIS — R413 Other amnesia: Secondary | ICD-10-CM

## 2014-07-14 MED ORDER — DIMETHYL FUMARATE 240 MG PO CPDR: 1.0000 | DELAYED_RELEASE_CAPSULE | Freq: Two times a day (BID) | ORAL | Status: DC

## 2014-07-14 NOTE — Patient Instructions (Addendum)
PLAN: 1. continue stroke prevention with aggrenox, statin, BP control per PCP 2. continue tecfidera for multiple sclerosis 3. Repeat labs - CBC wDiff, CMP, B12, folate, methylmalonic acid, homocysteine 4. Follow up in 6 months with Dr. Leta Baptist, sooner as needed.    Fall Prevention and Home Safety Falls cause injuries and can affect all age groups. It is possible to use preventive measures to significantly decrease the likelihood of falls. There are many simple measures which can make your home safer and prevent falls. OUTDOORS  Repair cracks and edges of walkways and driveways.  Remove high doorway thresholds.  Trim shrubbery on the main path into your home.  Have good outside lighting.  Clear walkways of tools, rocks, debris, and clutter.  Check that handrails are not broken and are securely fastened. Both sides of steps should have handrails.  Have leaves, snow, and ice cleared regularly.  Use sand or salt on walkways during winter months.  In the garage, clean up grease or oil spills. BATHROOM  Install night lights.  Install grab bars by the toilet and in the tub and shower.  Use non-skid mats or decals in the tub or shower.  Place a plastic non-slip stool in the shower to sit on, if needed.  Keep floors dry and clean up all water on the floor immediately.  Remove soap buildup in the tub or shower on a regular basis.  Secure bath mats with non-slip, double-sided rug tape.  Remove throw rugs and tripping hazards from the floors. BEDROOMS  Install night lights.  Make sure a bedside light is easy to reach.  Do not use oversized bedding.  Keep a telephone by your bedside.  Have a firm chair with side arms to use for getting dressed.  Remove throw rugs and tripping hazards from the floor. KITCHEN  Keep handles on pots and pans turned toward the center of the stove. Use back burners when possible.  Clean up spills quickly and allow time for  drying.  Avoid walking on wet floors.  Avoid hot utensils and knives.  Position shelves so they are not too high or low.  Place commonly used objects within easy reach.  If necessary, use a sturdy step stool with a grab bar when reaching.  Keep electrical cables out of the way.  Do not use floor polish or wax that makes floors slippery. If you must use wax, use non-skid floor wax.  Remove throw rugs and tripping hazards from the floor. STAIRWAYS  Never leave objects on stairs.  Place handrails on both sides of stairways and use them. Fix any loose handrails. Make sure handrails on both sides of the stairways are as long as the stairs.  Check carpeting to make sure it is firmly attached along stairs. Make repairs to worn or loose carpet promptly.  Avoid placing throw rugs at the top or bottom of stairways, or properly secure the rug with carpet tape to prevent slippage. Get rid of throw rugs, if possible.  Have an electrician put in a light switch at the top and bottom of the stairs. OTHER FALL PREVENTION TIPS  Wear low-heel or rubber-soled shoes that are supportive and fit well. Wear closed toe shoes.  When using a stepladder, make sure it is fully opened and both spreaders are firmly locked. Do not climb a closed stepladder.  Add color or contrast paint or tape to grab bars and handrails in your home. Place contrasting color strips on first and last steps.  Learn  and use mobility aids as needed. Install an electrical emergency response system.  Turn on lights to avoid dark areas. Replace light bulbs that burn out immediately. Get light switches that glow.  Arrange furniture to create clear pathways. Keep furniture in the same place.  Firmly attach carpet with non-skid or double-sided tape.  Eliminate uneven floor surfaces.  Select a carpet pattern that does not visually hide the edge of steps.  Be aware of all pets. OTHER HOME SAFETY TIPS  Set the water temperature  for 120 F (48.8 C).  Keep emergency numbers on or near the telephone.  Keep smoke detectors on every level of the home and near sleeping areas. Document Released: 07/28/2002 Document Revised: 02/06/2012 Document Reviewed: 10/27/2011 [facility] Patient Information 2015 [place], [place]. This information is not intended to replace advice given to you by your health care provider. Make sure you discuss any questions you have with your health care provider.

## 2014-07-14 NOTE — Progress Notes (Signed)
PATIENT: Grant Valdez DOB: September 25, 1950  REASON FOR VISIT: routine follow up for MS HISTORY FROM: patient  HISTORY OF PRESENT ILLNESS: UPDATE 07/14/14 (LL): Patient failed to follow up for a year, was supposed to follow up in 3 months after last visit.  He did not want to do PT. He has been well on Tecfidera with only flushing side effect. He presents today to have labs and get refills for his medication. Has fallen 2-3 times in the last year, no injuries. Complaints of bilateral finger numbness and paresthesias, slower cognitive processing time, fatigue. MMSE 28/30, AFT 18.  UPDATE 06/17/13: Patient presents for followup. Since last visit he has fallen 4 times including breaking his left foot, almost 4 months ago. Otherwise patient is stable. He still has issues with fatigue depression anxiety.  UPDATE 01/17/13: patient returns for urgent followup. Over past 3 weeks, patient has had increasing dizziness, balance difficulty, urination control problems. He feels some additional numbness or weakness in his legs. No difficulty with his hands or arms. No vision changes, slurred speech, trouble talking. Wife notes some more mood swings, irritability, depression, anxiety, attention and focus problems. He also has had more difficulty with hearing.  UPDATE 09/30/12: Since last visit, doing well. Started tecfidera on Jul 21, 2012. Initially flushing and GI sxs, but have eased up. No new MS flares or sxs.  UPDATE 06/19/12: S/p right CEA on 06/10/12. No complications. No new stroke/TIA symptoms. WIll proceed with MS treatment.   UPDATE 05/01/12: Since last visit, had MRI brain, c and t spine, confirming chronic MS. On 04/08/12, had sudden onset dizziness, seeing floaters, and left face arm leg numbness x 10 minutes. Didn't tell anyone for 4 days. Had full recovery. Today carotid u/s shows right ICA stenosis (50-69%). Also with fatigue, malaise. Thinking about disability.   PRIOR HPI: 63 year old  right-handed male with history of hypertension, hypercholesteremia, coronary artery disease, depression, anxiety, multiple sclerosis, TIA, here for evaluation and management of MS.  In 1989, patient developed numbness spots in the back. He was evaluated by Dr. Rosiland Valdez ([place]), with MRI and lumbar puncture, and given a diagnosis of MS. We do not have those records to review today. 2 years later he transferred care to Dr. Aggie Valdez [facility]), who treated him with prednisone. Over these years she developed some staggering, balance difficulty, vision problems. In 1995 he was started on Betaseron. Over the next 10-15 years he was on and off of Betaseron, do to fluctuation in liver function testing.  Patient last saw Dr. Aggie Valdez in 2011. He saw a different neurologist at [facility] in 2012. Since that time he has not seen a neurologist. His last MRI was around 2000 102,011, which is apparently stable. He weaned himself off of Betaseron in 2011 because he felt that his symptoms weren't fairly mild.  In 2013 he is started to develop increasing fatigue, balance difficulty, numbness in his hands and feet. Also having trouble with coordination.  REVIEW OF SYSTEMS: Full 14 system review of systems performed and notable only for weight loss fatigue hearing loss ringing in ears spinning sensation itching incontinence urination problems incontinence blurred vision feeling hot joint pain aching muscles depression anxiety too much sleep decreased energy change in appetite dizziness slurred speech numbness weakness confusion memory loss.   ALLERGIES: No Known Allergies  HOME MEDICATIONS: Outpatient Prescriptions Prior to Visit  Medication Sig Dispense Refill  . Aspirin-Dipyridamole (AGGRENOX PO) Take by mouth 2 (two) times daily.    Marland Kitchen  buPROPion (WELLBUTRIN XL) 300 MG 24 hr tablet Take 300 mg by mouth daily.    . citalopram (CELEXA) 20 MG tablet Take 20 mg by mouth daily.      Marland Kitchen ezetimibe (ZETIA) 10 MG  tablet Take 10 mg by mouth daily.      Marland Kitchen LORazepam (ATIVAN) 0.5 MG tablet Take 0.5 mg by mouth 2 (two) times daily as needed. nerves    . metoprolol (LOPRESSOR) 50 MG tablet Take 25 mg by mouth 2 (two) times daily.     . Multiple Vitamins-Minerals (MULTIVITAL) tablet Take 1 tablet by mouth daily.      . Omega-3 Fatty Acids (FISH OIL) 1000 MG CAPS Take 1 capsule by mouth 2 (two) times daily.     Marland Kitchen omeprazole (PRILOSEC) 20 MG capsule Take 20 mg by mouth daily.      . pravastatin (PRAVACHOL) 20 MG tablet Take 20 mg by mouth daily.    . TECFIDERA 240 MG CPDR TAKE 1 CAPSULE(240MG ) BY MOUTH TWICE DAILY 60 capsule 0   No facility-administered medications prior to visit.    PHYSICAL EXAM Filed Vitals:   07/14/14 1117  BP: 135/83  Pulse: 61  Height: 6' (1.829 m)  Weight: 240 lb 9.6 Valdez (109.135 kg)   Body mass index is 32.62 kg/(m^2).  Visual Acuity Screening   Right eye Left eye Both eyes  Without correction:     With correction: 20/70 20/200 20/70   MMSE - Mini Mental State Exam 07/14/2014  Orientation to time 5  Orientation to Place 5  Registration 3  Attention/ Calculation 5  Recall 1  Language- name 2 objects 2  Language- repeat 1  Language- follow 3 step command 3  Language- read & follow direction 1  Write a sentence 1  Copy design 1  Total score 28    Generalized: Well developed, in no acute distress  Head: normocephalic and atraumatic. Oropharynx benign  Neck: Supple, no carotid bruits  Cardiac: Regular rate rhythm, no murmur  Musculoskeletal: No deformity   NEUROLOGIC: MENTAL STATUS: awake, alert, language fluent, comprehension intact, naming intact. MMSE 28/30, AFT 18. CRANIAL NERVE: pupils equal and reactive to light, visual fields full to confrontation, extraocular muscles intact, no nystagmus, facial sensation and strength symmetric, uvula midline, shoulder shrug symmetric, tongue midline. MOTOR: normal bulk and tone, full strength in the BUE; BLE 4 SENSORY:  ABSENT VIB IN FEET; DECR SENS IN ANKLES AND FINGERS. COORDINATION: finger-nose-finger, fine finger movements normal REFLEXES: BUE 2, KNEES 3, ANKLES 2.  GAIT/STATION: ATAXIC GAIT. CANNOT TANDEM, TOE OR HEEL. POSITIVE ROMBERG.   DIAGNOSTIC DATA (LABS, IMAGING, TESTING) - I reviewed patient records, labs, notes, testing and imaging myself where available.  02/06/13 MRI brain 1. Multiple periventricular and subcortical chronic demyelinating plaques. Chronic plaque noted in the right medulla. 2. No acute plaques. 3. No change from MRI on 04/02/12.  ASSESSMENT: 63 y.o. male with history of multiple sclerosis, hypertension, hypercholesterolemia, coronary artery disease, TIA. Had right brain TIA on 04/08/12, with right ICA stenosis, s/p right CEA 06/10/12. Doing well on tecfidera. He reports being steroid intolerant in past (hallucinations, stomach irritation, weight gain). Complaints of bilateral finger numbness and paresthesias, memory loss, fatigue; worrisome for VitB 12 deficiency. Will check labs today.  PLAN: 1. continue stroke prevention with aggrenox, statin, BP control per PCP 2. continue tecfidera for multiple sclerosis, refills sent. 3. Repeat labs - CBC wDiff, CMP, B12, folate, methylmalonic acid, homocysteine, Vit D. 4. Follow up in 6 months with Dr.  [name], sooner as needed.  Orders Placed This Encounter  Procedures  . Methylmalonic acid, serum  . CBC With differential/Platelet  . Comprehensive metabolic panel  . B12 and Folate Panel  . Homocysteine  . Vit D  25 hydroxy (rtn osteoporosis monitoring)    Meds ordered this encounter  Medications  . Dimethyl Fumarate (TECFIDERA) 240 MG CPDR    Sig: Take 1 capsule (240 mg total) by mouth 2 (two) times daily.    Dispense:  180 capsule    Refill:  1    Order Specific Question:  Supervising Provider    Answer:  Penni Bombard [[id number]]   Return in about 6 months (around 01/12/2015) for MS.  Rudi Rummage Krimson Massmann, MSN, FNP-BC,  A/GNP-C 07/14/2014, 2:19 PM [facility] [address] [place], [place] [address] [phone]  Note: This document was prepared with digital dictation and possible smart phrase technology. Any transcriptional errors that result from this process are unintentional.

## 2014-07-17 LAB — COMPREHENSIVE METABOLIC PANEL
ALT: 46 IU/L — ABNORMAL HIGH (ref 0–44)
AST: 39 IU/L (ref 0–40)
Albumin/Globulin Ratio: 1.6 (ref 1.1–2.5)
Albumin: 4.5 g/dL (ref 3.6–4.8)
Alkaline Phosphatase: 32 IU/L — ABNORMAL LOW (ref 39–117)
BUN/Creatinine Ratio: 10 (ref 10–22)
BUN: 12 mg/dL (ref 8–27)
CO2: 21 mmol/L (ref 18–29)
Calcium: 9.6 mg/dL (ref 8.6–10.2)
Chloride: 99 mmol/L (ref 97–108)
Creatinine, Ser: 1.17 mg/dL (ref 0.76–1.27)
GFR calc Af Amer: 77 mL/min/{1.73_m2} (ref >59)
GFR calc non Af Amer: 67 mL/min/{1.73_m2} (ref >59)
Globulin, Total: 2.9 g/dL (ref 1.5–4.5)
Glucose: 88 mg/dL (ref 65–99)
Potassium: 4.5 mmol/L (ref 3.5–5.2)
Sodium: 137 mmol/L (ref 134–144)
Total Bilirubin: 0.5 mg/dL (ref 0.0–1.2)
Total Protein: 7.4 g/dL (ref 6.0–8.5)

## 2014-07-17 LAB — HOMOCYSTEINE: Homocysteine: 10.8 umol/L (ref 0.0–15.0)

## 2014-07-17 LAB — CBC WITH DIFFERENTIAL
Basophils Absolute: 0 10*3/uL (ref 0.0–0.2)
Basos: 0 %
Eos: 2 %
Eosinophils Absolute: 0.1 10*3/uL (ref 0.0–0.4)
HCT: 43.2 % (ref 37.5–51.0)
Hemoglobin: 14.9 g/dL (ref 12.6–17.7)
Immature Grans (Abs): 0 10*3/uL (ref 0.0–0.1)
Immature Granulocytes: 0 %
Lymphocytes Absolute: 1 10*3/uL (ref 0.7–3.1)
Lymphs: 21 %
MCH: 32.5 pg (ref 26.6–33.0)
MCHC: 34.5 g/dL (ref 31.5–35.7)
MCV: 94 fL (ref 79–97)
Monocytes Absolute: 0.7 10*3/uL (ref 0.1–0.9)
Monocytes: 14 %
Neutrophils Absolute: 3.1 10*3/uL (ref 1.4–7.0)
Neutrophils Relative %: 63 %
Platelets: 184 10*3/uL (ref 150–379)
RBC: 4.59 x10E6/uL (ref 4.14–5.80)
RDW: 13.7 % (ref 12.3–15.4)
WBC: 4.9 10*3/uL (ref 3.4–10.8)

## 2014-07-17 LAB — METHYLMALONIC ACID, SERUM: Methylmalonic Acid: 226 nmol/L (ref 0–378)

## 2014-07-17 LAB — B12 AND FOLATE PANEL
Folate: >19.9 ng/mL (ref >3.0)
Vitamin B-12: 302 pg/mL (ref 211–946)

## 2014-07-17 LAB — VITAMIN D 25 HYDROXY (VIT D DEFICIENCY, FRACTURES): Vit D, 25-Hydroxy: 18.1 ng/mL — ABNORMAL LOW (ref 30.0–100.0)

## 2014-07-22 ENCOUNTER — Telehealth: Payer: Self-pay | Admitting: Diagnostic Neuroimaging

## 2014-07-22 NOTE — Telephone Encounter (Signed)
Patient calling for blood work results.  Please call and advise. °

## 2014-07-22 NOTE — Telephone Encounter (Signed)
I will forward to Dr.Yan.

## 2014-07-22 NOTE — Telephone Encounter (Signed)
I have called patient, laboratory showed low vitamin D 18, rest of the laboratory was normal, he may take over-the-counter vitamin D3 supplement, repeat vitamin D level in one year

## 2014-07-27 NOTE — Progress Notes (Signed)
I reviewed note and agree with plan.   Cambell Rickenbach R. Rachel Rison, MD  Certified in Neurology, Neurophysiology and Neuroimaging  [facility] [address], [address] [place], [place] [address] [phone]   

## 2014-07-30 ENCOUNTER — Encounter: Payer: Self-pay | Admitting: Family

## 2014-07-31 ENCOUNTER — Encounter: Payer: Self-pay | Admitting: Family

## 2014-07-31 ENCOUNTER — Ambulatory Visit (HOSPITAL_COMMUNITY)
Admission: RE | Admit: 2014-07-31 | Discharge: 2014-07-31 | Disposition: A | Discharge status: Home / Self Care | Payer: BC Managed Care – PPO | Source: Ambulatory Visit | Attending: Family | Admitting: Family

## 2014-07-31 ENCOUNTER — Ambulatory Visit (INDEPENDENT_AMBULATORY_CARE_PROVIDER_SITE_OTHER): Payer: BC Managed Care – PPO | Admitting: Family

## 2014-07-31 VITALS — BP 143/94 | HR 59 | Resp 16 | Ht 72.0 in | Wt 236.0 lb

## 2014-07-31 DIAGNOSIS — Z48812 Encounter for surgical aftercare following surgery on the circulatory system: Secondary | ICD-10-CM

## 2014-07-31 DIAGNOSIS — I6523 Occlusion and stenosis of bilateral carotid arteries: Secondary | ICD-10-CM

## 2014-07-31 DIAGNOSIS — R079 Chest pain, unspecified: Secondary | ICD-10-CM | POA: Insufficient documentation

## 2014-07-31 DIAGNOSIS — I6529 Occlusion and stenosis of unspecified carotid artery: Secondary | ICD-10-CM | POA: Diagnosis present

## 2014-07-31 DIAGNOSIS — Z9889 Other specified postprocedural states: Secondary | ICD-10-CM

## 2014-07-31 NOTE — Patient Instructions (Signed)
Stroke Prevention Some medical conditions and behaviors are associated with an increased chance of having a stroke. You may prevent a stroke by making healthy choices and managing medical conditions. HOW CAN I REDUCE MY RISK OF HAVING A STROKE?   Stay physically active. Get at least 30 minutes of activity on most or all days.  Do not smoke. It may also be helpful to avoid exposure to secondhand smoke.  Limit alcohol use. Moderate alcohol use is considered to be:  No more than 2 drinks per day for men.  No more than 1 drink per day for nonpregnant women.  Eat healthy foods. This involves:  Eating 5 or more servings of fruits and vegetables a day.  Making dietary changes that address high blood pressure (hypertension), high cholesterol, diabetes, or obesity.  Manage your cholesterol levels.  Making food choices that are high in fiber and low in saturated fat, trans fat, and cholesterol may control cholesterol levels.  Take any prescribed medicines to control cholesterol as directed by your health care provider.  Manage your diabetes.  Controlling your carbohydrate and sugar intake is recommended to manage diabetes.  Take any prescribed medicines to control diabetes as directed by your health care provider.  Control your hypertension.  Making food choices that are low in salt (sodium), saturated fat, trans fat, and cholesterol is recommended to manage hypertension.  Take any prescribed medicines to control hypertension as directed by your health care provider.  Maintain a healthy weight.  Reducing calorie intake and making food choices that are low in sodium, saturated fat, trans fat, and cholesterol are recommended to manage weight.  Stop drug abuse.  Avoid taking birth control pills.  Talk to your health care provider about the risks of taking birth control pills if you are over 35 years old, smoke, get migraines, or have ever had a blood clot.  Get evaluated for sleep  disorders (sleep apnea).  Talk to your health care provider about getting a sleep evaluation if you snore a lot or have excessive sleepiness.  Take medicines only as directed by your health care provider.  For some people, aspirin or blood thinners (anticoagulants) are helpful in reducing the risk of forming abnormal blood clots that can lead to stroke. If you have the irregular heart rhythm of atrial fibrillation, you should be on a blood thinner unless there is a good reason you cannot take them.  Understand all your medicine instructions.  Make sure that other conditions (such as anemia or atherosclerosis) are addressed. SEEK IMMEDIATE MEDICAL CARE IF:   You have sudden weakness or numbness of the face, arm, or leg, especially on one side of the body.  Your face or eyelid droops to one side.  You have sudden confusion.  You have trouble speaking (aphasia) or understanding.  You have sudden trouble seeing in one or both eyes.  You have sudden trouble walking.  You have dizziness.  You have a loss of balance or coordination.  You have a sudden, severe headache with no known cause.  You have new chest pain or an irregular heartbeat. Any of these symptoms may represent a serious problem that is an emergency. Do not wait to see if the symptoms will go away. Get medical help at once. Call your local emergency services (911 in [place] Do not drive yourself to the hospital. Document Released: 09/14/2004 Document Revised: 12/22/2013 Document Reviewed: 02/07/2013 [employer] Patient Information 2015 [employer]. This information is not intended to replace advice given   to you by your health care provider. Make sure you discuss any questions you have with your health care provider.  

## 2014-07-31 NOTE — Progress Notes (Signed)
Established Carotid Patient   History of Present Illness  Grant Valdez is a 63 y.o. male patient of Dr. Donnetta Hutching who is s/p right carotid endarterectomy in October of 2013. He has had progression of MS and has had to go out on disability. He reports that he may be staggering a little more, but otherwise feels his MS is stable.  He returns today for follow up.  He had two TIA's several years before the CEA, about 2 weeks apart; both manifested as expressive aphasia, denies hemiparesis, denies monocular loss of vision, denies unilateral facial drooping.  The patient denies New Medical or Surgical History.  Pt Diabetic: No Pt smoker: former smoker, quit in 1995  Pt meds include: Statin : Yes ASA: Yes Other anticoagulants/antiplatelets: no   Past Medical History  Diagnosis Date  . MS (multiple sclerosis)     diagnosed in 1989. He was treated with prednisone followed by betaseron which was gradually tapered because his LFTs went up and it resolved off therapy. Likely he is remained in remission for years  . HLD (hyperlipidemia)   . S/P right inguinal herniorrhaphy   . TIA (transient ischemic attack)   . CAD (coronary artery disease)     had 2 arteries tented four years ago sees Dr. Dannielle Burn  . HTN (hypertension)     Sees Dr. Woody Seller  . Depression   . Anxiety   . GERD (gastroesophageal reflux disease)   . Headache(784.0)     migraines hx of  . Neuromuscular disorder     has Multiple Sclerosis, sees Dr. Leta Baptist  . Stroke     "patient states has had several TIA's, last one 05/27/12"  . Carotid artery occlusion   . Diabetes mellitus without complication     Social History History  Substance Use Topics  . Smoking status: Former Smoker -- 2.00 packs/day for 20 years    Types: Cigarettes    Quit date: 08/21/1993  . Smokeless tobacco: Never Used  . Alcohol Use: No    Family History Family History  Problem Relation Age of Onset  . Diabetes Mother   . Hypertension Mother   .  Heart disease Mother     Before age 77  . Hyperlipidemia Mother   . Heart attack Father   . Lung cancer Father   . Cancer Father     Lung  . Heart disease Father     Before age 35  . Hypertension Son     Surgical History Past Surgical History  Procedure Laterality Date  . Colonoscopy  March 12, 2006    Had 2 polyps removed; one was an adenoma and the other one was hyperplastic  . Stents    . Varicose removal from scrotum  1982  . Hydrocele excision  2011  . Vasectomy  1989  . Hernia repair  age 61  . Endarterectomy  06/10/2012    Procedure: ENDARTERECTOMY CAROTID;  Surgeon: Rosetta Posner, MD;  Location: [place];  Service: Vascular;  Laterality: Right;  With Dacron Patch Angioplasty  . Carotid endarterectomy    . Colonoscopy N/A 10/09/2012    Procedure: COLONOSCOPY;  Surgeon: Rogene Houston, MD;  [facility];  Service: Endoscopy;  Laterality: N/A;  1030-rescheduled to 12:00pm per [name]    No Known Allergies  Current Outpatient Prescriptions  Medication Sig Dispense Refill  . Aspirin-Dipyridamole (AGGRENOX PO) Take by mouth 2 (two) times daily.    Marland Kitchen buPROPion (WELLBUTRIN XL) 300 MG 24 hr tablet  Take 300 mg by mouth daily.    . Cholecalciferol (CVS VIT D 5000 HIGH-POTENCY PO) Take by mouth daily.    . Choline Fenofibrate (FENOFIBRIC ACID) 135 MG CPDR daily.  0  . citalopram (CELEXA) 20 MG tablet Take 20 mg by mouth daily.      . Dimethyl Fumarate (TECFIDERA) 240 MG CPDR Take 1 capsule (240 mg total) by mouth 2 (two) times daily. 180 capsule 1  . ezetimibe (ZETIA) 10 MG tablet Take 10 mg by mouth daily.      Marland Kitchen LORazepam (ATIVAN) 0.5 MG tablet Take 0.5 mg by mouth 2 (two) times daily as needed. nerves    . metoprolol (LOPRESSOR) 50 MG tablet Take 25 mg by mouth 2 (two) times daily.     . Multiple Vitamins-Minerals (MULTIVITAL) tablet Take 1 tablet by mouth daily.      . Omega-3 Fatty Acids (FISH OIL) 1000 MG CAPS Take 1 capsule by mouth 2 (two) times daily.     Marland Kitchen  omeprazole (PRILOSEC) 20 MG capsule Take 20 mg by mouth daily.      . pravastatin (PRAVACHOL) 20 MG tablet Take 20 mg by mouth daily.     No current facility-administered medications for this visit.    Review of Systems : See HPI for pertinent positives and negatives.  Physical Examination  Filed Vitals:   07/31/14 1530 07/31/14 1534  BP: 129/87 143/94  Pulse: 60 59  Resp:  16  Height:  6' (1.829 m)  Weight:  236 lb (107.049 kg)  SpO2:  98%   Body mass index is 32 kg/(m^2).  General: WDWN male in NAD GAIT: normal Eyes: PERRLA Pulmonary:  Non-labored, CTAB, Negative  Rales, Negative rhonchi, & Negative wheezing.  Cardiac: regular Rhythm,  Negative detected murmur.  VASCULAR EXAM Carotid Bruits Right Left   Negative Negative    Aorta is not palpable. Radial pulses are 2+ palpable and equal.                                                                                                                            LE Pulses Right Left       POPLITEAL  not palpable   not palpable       POSTERIOR TIBIAL   palpable    palpable        DORSALIS PEDIS      ANTERIOR TIBIAL  palpable   palpable     Gastrointestinal: soft, nontender, BS WNL, no r/g,  no palpated masses.  Musculoskeletal: Negative muscle atrophy/wasting. M/S 5/5 throughout, Extremities without ischemic changes.  Neurologic: A&O X 3; Appropriate Affect,  Speech is normal CN 2-12 intact, Pain and light touch intact in extremities, Motor exam as listed above.   Non-Invasive Vascular Imaging CAROTID DUPLEX 07/31/2014   CEREBROVASCULAR DUPLEX EVALUATION    INDICATION: Carotid artery disease     PREVIOUS INTERVENTION(S): Right carotid endarterectomy 06/10/2012.    DUPLEX EXAM:     RIGHT  LEFT  Peak Systolic Velocities (cm/s) End Diastolic Velocities (cm/s) Plaque LOCATION Peak Systolic Velocities (cm/s) End Diastolic Velocities (cm/s) Plaque  77 17  CCA PROXIMAL 86 19   72 21  CCA MID 77 20   78 20   CCA DISTAL 91 24   72 13  ECA 58 10 HT  42 8  ICA PROXIMAL 86 18 HT  55 20  ICA MID 63 20   62 23  ICA DISTAL 69 29      ICA / CCA Ratio (PSV) 1.11  Antegrade  Vertebral Flow Antegrade   856 Brachial Systolic Pressure (mmHg) 314  Triphasic  Brachial Artery Waveforms Triphasic     Plaque Morphology:  HM = Homogeneous, HT = Heterogeneous, CP = Calcific Plaque, SP = Smooth Plaque, IP = Irregular Plaque  ADDITIONAL FINDINGS:     IMPRESSION: Patent right carotid endarterectomy site with no evidence of hyperplasia or restenosis.  Left internal carotid artery velocities suggest a <40% stenosis.     Compared to the previous exam:  No significant change in comparison to the last exam on 07/01/2013.      Assessment: Grant Valdez is a 63 y.o. male who is s/p right carotid endarterectomy in October of 2013. He has had progression of MS and has had to go out on disability. He reports that he may be staggering a little more, but otherwise feels his MS is stable.   He had two TIA's several years before the CEA, about 2 weeks apart; both manifested as expressive aphasia, no stroke or TIA's since then. Today's carotid Duplex reveals a patent right carotid endarterectomy site with no evidence of hyperplasia or restenosis and <40% left ICA stenosis.   No significant change in comparison to the last exam on 07/01/2013.  Plan: Follow-up in 1 year with Carotid Duplex.   I discussed in depth with the patient the nature of atherosclerosis, and emphasized the importance of maximal medical management including strict control of blood pressure, blood glucose, and lipid levels, obtaining regular exercise, and continued cessation of smoking.  The patient is aware that without maximal medical management the underlying atherosclerotic disease process will progress, limiting the benefit of any interventions. The patient was given information about stroke prevention and what symptoms should prompt the patient to  seek immediate medical care. Thank you for allowing [place] to participate in this patient's care.  Clemon Chambers, RN, MSN, FNP-C [facility] Office: [phone]  Clinic Physician: Bridgett Larsson  07/31/2014 3:53 PM

## 2015-01-05 ENCOUNTER — Encounter: Payer: Self-pay | Admitting: Diagnostic Neuroimaging

## 2015-01-05 ENCOUNTER — Ambulatory Visit (INDEPENDENT_AMBULATORY_CARE_PROVIDER_SITE_OTHER): Payer: BLUE CROSS/BLUE SHIELD | Admitting: Diagnostic Neuroimaging

## 2015-01-05 VITALS — BP 139/72 | HR 70 | Ht 72.0 in | Wt 234.0 lb

## 2015-01-05 DIAGNOSIS — G35 Multiple sclerosis: Secondary | ICD-10-CM

## 2015-01-05 NOTE — Patient Instructions (Signed)
Continue tecfidera.

## 2015-01-05 NOTE — Progress Notes (Signed)
[facility]  PATIENT: Grant Valdez DOB: [birth year]  REFERRING CLINICIAN:  HISTORY FROM: patient  REASON FOR VISIT: follow up   HISTORICAL  CHIEF COMPLAINT:  Chief Complaint  Patient presents with  . Follow-up    Multiple Sclerosis     HISTORY OF PRESENT ILLNESS:   UPDATE 01/05/15: Since last visit, overall stable. Still notes some balance difficulty. He is trying to increase his walking back up to 2 miles per day. Tolerating meds. No new issues.  UPDATE 07/14/14 (LL): Patient failed to follow up for a year, was supposed to follow up in 3 months after last visit. He did not want to do PT. He has been well on Tecfidera with only flushing side effect. He presents today to have labs and get refills for his medication. Has fallen 2-3 times in the last year, no injuries. Complaints of bilateral finger numbness and paresthesias, slower cognitive processing time, fatigue. MMSE 28/30, AFT 18.  UPDATE 06/17/13: Patient presents for followup. Since last visit he has fallen 4 times including breaking his left foot, almost 4 months ago. Otherwise patient is stable. He still has issues with fatigue depression anxiety.  UPDATE 01/17/13: patient returns for urgent followup. Over past 3 weeks, patient has had increasing dizziness, balance difficulty, urination control problems. He feels some additional numbness or weakness in his legs. No difficulty with his hands or arms. No vision changes, slurred speech, trouble talking. Wife notes some more mood swings, irritability, depression, anxiety, attention and focus problems. He also has had more difficulty with hearing.  UPDATE 09/30/12: Since last visit, doing well. Started tecfidera on Jul 21, 2012. Initially flushing and GI sxs, but have eased up. No new MS flares or sxs.  UPDATE 06/19/12: S/p right CEA on 06/10/12. No complications. No new stroke/TIA symptoms. WIll proceed with MS treatment.   UPDATE 05/01/12: Since last visit,  had MRI brain, c and t spine, confirming chronic MS. On 04/08/12, had sudden onset dizziness, seeing floaters, and left face arm leg numbness x 10 minutes. Didn't tell anyone for 4 days. Had full recovery. Today carotid u/s shows right ICA stenosis (50-69%). Also with fatigue, malaise. Thinking about disability.   PRIOR HPI: 64 year old right-handed male with history of hypertension, hypercholesteremia, coronary artery disease, depression, anxiety, multiple sclerosis, TIA, here for evaluation and management of MS.  In 1989, patient developed numbness spots in the back. He was evaluated by Dr. Rosiland Oz ([place]), with MRI and lumbar puncture, and given a diagnosis of MS. We do not have those records to review today. 2 years later he transferred care to Dr. Aggie Hacker [facility]), who treated him with prednisone. Over these years she developed some staggering, balance difficulty, vision problems. In 1995 he was started on Betaseron. Over the next 10-15 years he was on and off of Betaseron, do to fluctuation in liver function testing.  Patient last saw Dr. Aggie Hacker in 2011. He saw a different neurologist at [facility] in 2012. Since that time he has not seen a neurologist. His last MRI was around 2000 102,011, which is apparently stable. He weaned himself off of Betaseron in 2011 because he felt that his symptoms weren't fairly mild.  In 2013 he is started to develop increasing fatigue, balance difficulty, numbness in his hands and feet. Also having trouble with coordination.   REVIEW OF SYSTEMS: Full 14 system review of systems performed and notable only for hearing loss ringing in ears eye itching.  ALLERGIES: No Known Allergies  HOME  MEDICATIONS: Outpatient Prescriptions Prior to Visit  Medication Sig Dispense Refill  . Aspirin-Dipyridamole (AGGRENOX PO) Take by mouth 2 (two) times daily.    Marland Kitchen buPROPion (WELLBUTRIN XL) 300 MG 24 hr tablet Take 300 mg by mouth daily.    . Cholecalciferol (CVS  VIT D 5000 HIGH-POTENCY PO) Take by mouth daily.    . Choline Fenofibrate (FENOFIBRIC ACID) 135 MG CPDR daily.  0  . Dimethyl Fumarate (TECFIDERA) 240 MG CPDR Take 1 capsule (240 mg total) by mouth 2 (two) times daily. 180 capsule 1  . ezetimibe (ZETIA) 10 MG tablet Take 10 mg by mouth daily.      Marland Kitchen LORazepam (ATIVAN) 0.5 MG tablet Take 0.5 mg by mouth 2 (two) times daily as needed. nerves    . metoprolol (LOPRESSOR) 50 MG tablet Take 25 mg by mouth 2 (two) times daily.     . Multiple Vitamins-Minerals (MULTIVITAL) tablet Take 1 tablet by mouth daily.      . Omega-3 Fatty Acids (FISH OIL) 1000 MG CAPS Take 1 capsule by mouth 2 (two) times daily.     Marland Kitchen omeprazole (PRILOSEC) 20 MG capsule Take 20 mg by mouth daily.      . pravastatin (PRAVACHOL) 20 MG tablet Take 20 mg by mouth daily.    . citalopram (CELEXA) 20 MG tablet Take 20 mg by mouth daily.       No facility-administered medications prior to visit.    PAST MEDICAL HISTORY: Past Medical History  Diagnosis Date  . MS (multiple sclerosis)     diagnosed in 1989. He was treated with prednisone followed by betaseron which was gradually tapered because his LFTs went up and it resolved off therapy. Likely he is remained in remission for years  . HLD (hyperlipidemia)   . S/P right inguinal herniorrhaphy   . TIA (transient ischemic attack)   . CAD (coronary artery disease)     had 2 arteries tented four years ago sees Dr. Dannielle Burn  . HTN (hypertension)     Sees Dr. Woody Seller  . Depression   . Anxiety   . GERD (gastroesophageal reflux disease)   . Headache(784.0)     migraines hx of  . Neuromuscular disorder     has Multiple Sclerosis, sees Dr. Leta Baptist  . Stroke     "patient states has had several TIA's, last one 05/27/12"  . Carotid artery occlusion   . Diabetes mellitus without complication     PAST SURGICAL HISTORY: Past Surgical History  Procedure Laterality Date  . Colonoscopy  March 12, 2006    Had 2 polyps removed; one was an  adenoma and the other one was hyperplastic  . Stents    . Varicose removal from scrotum  1982  . Hydrocele excision  2011  . Vasectomy  1989  . Hernia repair  age 55  . Endarterectomy  06/10/2012    Procedure: ENDARTERECTOMY CAROTID;  Surgeon: Rosetta Posner, MD;  Location: [place];  Service: Vascular;  Laterality: Right;  With Dacron Patch Angioplasty  . Carotid endarterectomy    . Colonoscopy N/A 10/09/2012    Procedure: COLONOSCOPY;  Surgeon: Rogene Houston, MD;  [facility];  Service: Endoscopy;  Laterality: N/A;  1030-rescheduled to 12:00pm per [name]    FAMILY HISTORY: Family History  Problem Relation Age of Onset  . Diabetes Mother   . Hypertension Mother   . Heart disease Mother     Before age 48  . Hyperlipidemia Mother   .  Heart attack Father   . Lung cancer Father   . Cancer Father     Lung  . Heart disease Father     Before age 38  . Hypertension Son     SOCIAL HISTORY:  History   Social History  . Marital Status: Married    Spouse Name: Kennett Symes  . Number of Children: 2  . Years of Education: 30yrs   Occupational History  .      [employer]   Social History Main Topics  . Smoking status: Former Smoker -- 2.00 packs/day for 20 years    Types: Cigarettes    Quit date: 08/21/1993  . Smokeless tobacco: Never Used  . Alcohol Use: No  . Drug Use: No  . Sexual Activity: Not on file   Other Topics Concern  . Not on file   Social History Narrative   Full time. Married. Works at [employer].    Pt lives at home with his spouse.   Caffeine Use: none     PHYSICAL EXAM  Filed Vitals:   01/05/15 0822  BP: 139/72  Pulse: 70  Height: 6' (1.829 m)  Weight: 234 lb (106.142 kg)    Body mass index is 31.73 kg/(m^2).   Visual Acuity Screening   Right eye Left eye Both eyes  Without correction:     With correction: 20/50 20/100     MMSE - Mini Mental State Exam 07/14/2014  Orientation to time 5  Orientation to Place 5    Registration 3  Attention/ Calculation 5  Recall 1  Language- name 2 objects 2  Language- repeat 1  Language- follow 3 step command 3  Language- read & follow direction 1  Write a sentence 1  Copy design 1  Total score 28    GENERAL EXAM: Patient is in no distress; well developed, nourished and groomed; neck is supple  CARDIOVASCULAR: Regular rate and rhythm, no murmurs, no carotid bruits  NEUROLOGIC: MENTAL STATUS: awake, alert, language fluent, comprehension intact, naming intact, fund of knowledge appropriate CRANIAL NERVE: no papilledema on fundoscopic exam, pupils equal and reactive to light, visual fields full to confrontation, extraocular muscles intact, no nystagmus, facial sensation and strength symmetric, hearing intact, palate elevates symmetrically, uvula midline, shoulder shrug symmetric, tongue midline. MOTOR: normal bulk and tone, full strength in the BUE, BLE SENSORY: normal and symmetric to light touch, pinprick, temperature, vibration; EXCEPT SLIGHT DECR TEMP AND VIB IN HANDS AND FEET COORDINATION: finger-nose-finger, fine finger movements normal REFLEXES: deep tendon reflexes present and symmetric GAIT/STATION: narrow based gait; SLIGHT DIFF WITH TOE, HEEL AND TANDEM GAIT; romberg is negative    DIAGNOSTIC DATA (LABS, IMAGING, TESTING) - I reviewed patient records, labs, notes, testing and imaging myself where available.  Lab Results  Component Value Date   WBC 4.9 07/14/2014   HGB 14.9 07/14/2014   HCT 43.2 07/14/2014   MCV 94 07/14/2014   PLT 184 07/14/2014      Component Value Date/Time   NA 137 07/14/2014 1250   NA 137 06/11/2012 0400   K 4.5 07/14/2014 1250   CL 99 07/14/2014 1250   CO2 21 07/14/2014 1250   GLUCOSE 88 07/14/2014 1250   GLUCOSE 126* 06/11/2012 0400   BUN 12 07/14/2014 1250   BUN 9 06/11/2012 0400   CREATININE 1.17 07/14/2014 1250   CALCIUM 9.6 07/14/2014 1250   PROT 7.4 07/14/2014 1250   PROT 7.5 05/28/2012 1126    ALBUMIN 4.2 05/28/2012 1126  AST 39 07/14/2014 1250   ALT 46* 07/14/2014 1250   ALKPHOS 32* 07/14/2014 1250   BILITOT 0.5 07/14/2014 1250   GFRNONAA 67 07/14/2014 1250   GFRAA 77 07/14/2014 1250   No results found for: CHOL, HDL, LDLCALC, LDLDIRECT, TRIG, CHOLHDL Lab Results  Component Value Date   HGBA1C 5.4 01/17/2013   Lab Results  Component Value Date   GNFAOZHY86 578 07/14/2014   Lab Results  Component Value Date   TSH 2.320 01/17/2013   VIT D, 25-HYDROXY  Date Value Ref Range Status  07/14/2014 18.1* 30.0 - 100.0 ng/mL Final    Comment:    Vitamin D deficiency has been defined by the [place] practice guideline as a level of serum 25-OH vitamin D less than 20 ng/mL (1,2). The Endocrine Society went on to further define vitamin D insufficiency as a level between 21 and 29 ng/mL (2). 1. IOM (Institute of Medicine). 2010. Dietary reference    intakes for calcium and D. [place]: The    [employer]. 2. Holick MF, [place], Bischoff-Ferrari HA, et al.    Evaluation, treatment, and prevention of vitamin D    deficiency: an Endocrine Society clinical practice    guideline. JCEM. 2011 Jul; 96(7):1911-30.    LYMPHOCYTES ABSOLUTE  Date Value Ref Range Status  07/14/2014 1.0 0.7 - 3.1 x10E3/uL Final  06/17/2013 1.0 0.7 - 3.1 x10E3/uL Final  01/17/2013 0.9 0.7 - 3.1 x10E3/uL Final    02/06/13 MRI brain (with and without) demonstrating: 1. Multiple periventricular and subcortical chronic demyelinating plaques. Chronic plaque noted in the right medulla. 2. No acute plaques. 3. No change from MRI on 04/02/12.  02/06/13 MRI thoracic spine (with and without) demonstrating: 1. Multiple chronic demyelinating plaques at T5, T6, T7 and T9-10. 2. No acute plaques. 3. No change from MRI on 04/02/12.    ASSESSMENT AND PLAN  63 y.o. year old male here with multiple sclerosis and prior TIA.  MS --> Doing well on  tecfidera. He reports being steroid intolerant in past (hallucinations, stomach irritation, weight gain).   TIA --> due to hypertension, hypercholesterolemia. Had right brain TIA on 04/08/12, with right ICA stenosis, s/p right CEA 06/10/12.   PLAN: 1. continue tecfidera for multiple sclerosis 2. continue stroke prevention with aggrenox, statin, BP control per PCP 3. Labs and MRI    Orders Placed This Encounter  Procedures  . MR Brain W Wo Contrast  . CBC with Differential/Platelet  . Comprehensive metabolic panel  . Stratify JCV Antibody Test ([facility])   Return in about 6 months (around 07/08/2015).  I spent 15 minutes of face to face time with patient. Greater than 50% of time was spent in counseling and coordination of care with patient.     Penni Bombard, MD 4/69/6295, 2:84 AM Certified in Neurology, Neurophysiology and Neuroimaging  [facility] [address] [place], [place] [address] [phone]

## 2015-01-06 LAB — CBC WITH DIFFERENTIAL/PLATELET
Basophils Absolute: 0 10*3/uL (ref 0.0–0.2)
Basos: 1 %
EOS (ABSOLUTE): 0.1 10*3/uL (ref 0.0–0.4)
EOS: 2 %
HEMATOCRIT: 43.8 % (ref 37.5–51.0)
Hemoglobin: 14.9 g/dL (ref 12.6–17.7)
IMMATURE GRANULOCYTES: 0 %
Immature Grans (Abs): 0 10*3/uL (ref 0.0–0.1)
LYMPHS ABS: 0.8 10*3/uL (ref 0.7–3.1)
Lymphs: 17 %
MCH: 32.3 pg (ref 26.6–33.0)
MCHC: 34 g/dL (ref 31.5–35.7)
MCV: 95 fL (ref 79–97)
MONOS ABS: 0.6 10*3/uL (ref 0.1–0.9)
Monocytes: 13 %
NEUTROS ABS: 3.2 10*3/uL (ref 1.4–7.0)
Neutrophils: 67 %
PLATELETS: 199 10*3/uL (ref 150–379)
RBC: 4.61 x10E6/uL (ref 4.14–5.80)
RDW: 14 % (ref 12.3–15.4)
WBC: 4.8 10*3/uL (ref 3.4–10.8)

## 2015-01-06 LAB — COMPREHENSIVE METABOLIC PANEL
A/G RATIO: 2 (ref 1.1–2.5)
ALK PHOS: 35 IU/L — AB (ref 39–117)
ALT: 47 IU/L — ABNORMAL HIGH (ref 0–44)
AST: 37 IU/L (ref 0–40)
Albumin: 4.7 g/dL (ref 3.6–4.8)
BUN / CREAT RATIO: 15 (ref 10–22)
BUN: 18 mg/dL (ref 8–27)
Bilirubin Total: 0.7 mg/dL (ref 0.0–1.2)
CO2: 22 mmol/L (ref 18–29)
CREATININE: 1.18 mg/dL (ref 0.76–1.27)
Calcium: 9.6 mg/dL (ref 8.6–10.2)
Chloride: 103 mmol/L (ref 97–108)
GFR calc Af Amer: 77 mL/min/{1.73_m2} (ref >59)
GFR, EST NON AFRICAN AMERICAN: 66 mL/min/{1.73_m2} (ref >59)
GLOBULIN, TOTAL: 2.4 g/dL (ref 1.5–4.5)
Glucose: 96 mg/dL (ref 65–99)
POTASSIUM: 4.7 mmol/L (ref 3.5–5.2)
SODIUM: 143 mmol/L (ref 134–144)
Total Protein: 7.1 g/dL (ref 6.0–8.5)

## 2015-01-12 ENCOUNTER — Ambulatory Visit: Payer: BC Managed Care – PPO | Admitting: Diagnostic Neuroimaging

## 2015-02-03 ENCOUNTER — Other Ambulatory Visit: Payer: Self-pay

## 2015-02-03 MED ORDER — DIMETHYL FUMARATE 240 MG PO CPDR: 1.0000 | DELAYED_RELEASE_CAPSULE | Freq: Two times a day (BID) | ORAL | Status: DC

## 2015-02-19 ENCOUNTER — Telehealth: Payer: Self-pay | Admitting: *Deleted

## 2015-02-19 NOTE — Telephone Encounter (Signed)
Called and spoke with the pt, apologized for taking so long to get back with him about his lab work but informed him that they looked ok per Dr. Leta Baptist. He thanked me for calling and stated an understanding

## 2015-06-30 ENCOUNTER — Telehealth: Payer: Self-pay | Admitting: Diagnostic Neuroimaging

## 2015-06-30 NOTE — Telephone Encounter (Signed)
Immanuel with [employer] is calling regarding prior authorization for medication Dimethyl Fumarate (TECFIDERA) 240 MG CPDR for the patient. Please call and discuss. Thank you.

## 2015-06-30 NOTE — Telephone Encounter (Signed)
Ins has been contacted and provided with clinical info.  Request is currently under review Ref # [id number]  [facility]) has approved the request for continuation of coverage on Tecfidera effective until 06/29/2016 Ref # [id number] Ref Patient ID # [id number].  They have notified the patient of this decision.  As well, [employer] has been notified.

## 2015-07-03 ENCOUNTER — Encounter (HOSPITAL_COMMUNITY): Payer: Self-pay | Admitting: Emergency Medicine

## 2015-07-03 ENCOUNTER — Emergency Department (HOSPITAL_COMMUNITY)
Admission: EM | Admit: 2015-07-03 | Discharge: 2015-07-03 | Disposition: A | Discharge status: Home / Self Care | Payer: Medicare Other | Attending: Emergency Medicine | Admitting: Emergency Medicine

## 2015-07-03 DIAGNOSIS — Z87891 Personal history of nicotine dependence: Secondary | ICD-10-CM | POA: Insufficient documentation

## 2015-07-03 DIAGNOSIS — E119 Type 2 diabetes mellitus without complications: Secondary | ICD-10-CM | POA: Insufficient documentation

## 2015-07-03 DIAGNOSIS — G43909 Migraine, unspecified, not intractable, without status migrainosus: Secondary | ICD-10-CM | POA: Diagnosis not present

## 2015-07-03 DIAGNOSIS — I2510 Atherosclerotic heart disease of native coronary artery without angina pectoris: Secondary | ICD-10-CM | POA: Insufficient documentation

## 2015-07-03 DIAGNOSIS — F419 Anxiety disorder, unspecified: Secondary | ICD-10-CM | POA: Insufficient documentation

## 2015-07-03 DIAGNOSIS — Z8673 Personal history of transient ischemic attack (TIA), and cerebral infarction without residual deficits: Secondary | ICD-10-CM | POA: Diagnosis not present

## 2015-07-03 DIAGNOSIS — E785 Hyperlipidemia, unspecified: Secondary | ICD-10-CM | POA: Diagnosis not present

## 2015-07-03 DIAGNOSIS — I10 Essential (primary) hypertension: Secondary | ICD-10-CM | POA: Insufficient documentation

## 2015-07-03 DIAGNOSIS — F329 Major depressive disorder, single episode, unspecified: Secondary | ICD-10-CM | POA: Diagnosis not present

## 2015-07-03 DIAGNOSIS — Z79899 Other long term (current) drug therapy: Secondary | ICD-10-CM | POA: Diagnosis not present

## 2015-07-03 DIAGNOSIS — K219 Gastro-esophageal reflux disease without esophagitis: Secondary | ICD-10-CM | POA: Diagnosis not present

## 2015-07-03 DIAGNOSIS — R109 Unspecified abdominal pain: Secondary | ICD-10-CM | POA: Insufficient documentation

## 2015-07-03 LAB — URINALYSIS, ROUTINE W REFLEX MICROSCOPIC
BILIRUBIN URINE: NEGATIVE
Glucose, UA: NEGATIVE mg/dL
KETONES UR: NEGATIVE mg/dL
LEUKOCYTES UA: NEGATIVE
NITRITE: NEGATIVE
PROTEIN: NEGATIVE mg/dL
Specific Gravity, Urine: 1.02 (ref 1.005–1.030)
UROBILINOGEN UA: 0.2 mg/dL (ref 0.0–1.0)
pH: 6 (ref 5.0–8.0)

## 2015-07-03 LAB — CBC
HEMATOCRIT: 46.6 % (ref 39.0–52.0)
Hemoglobin: 16 g/dL (ref 13.0–17.0)
MCH: 33.4 pg (ref 26.0–34.0)
MCHC: 34.3 g/dL (ref 30.0–36.0)
MCV: 97.3 fL (ref 78.0–100.0)
PLATELETS: 183 10*3/uL (ref 150–400)
RBC: 4.79 MIL/uL (ref 4.22–5.81)
RDW: 12.8 % (ref 11.5–15.5)
WBC: 7.6 10*3/uL (ref 4.0–10.5)

## 2015-07-03 LAB — COMPREHENSIVE METABOLIC PANEL
ALT: 58 U/L (ref 17–63)
ANION GAP: 9 (ref 5–15)
AST: 47 U/L — ABNORMAL HIGH (ref 15–41)
Albumin: 4.8 g/dL (ref 3.5–5.0)
Alkaline Phosphatase: 38 U/L (ref 38–126)
BUN: 15 mg/dL (ref 6–20)
CALCIUM: 9.8 mg/dL (ref 8.9–10.3)
CO2: 26 mmol/L (ref 22–32)
Chloride: 102 mmol/L (ref 101–111)
Creatinine, Ser: 1.15 mg/dL (ref 0.61–1.24)
GFR calc non Af Amer: >60 mL/min (ref >60)
GLUCOSE: 93 mg/dL (ref 65–99)
POTASSIUM: 4.6 mmol/L (ref 3.5–5.1)
Sodium: 137 mmol/L (ref 135–145)
TOTAL PROTEIN: 8.1 g/dL (ref 6.5–8.1)
Total Bilirubin: 0.9 mg/dL (ref 0.3–1.2)

## 2015-07-03 LAB — URINE MICROSCOPIC-ADD ON

## 2015-07-03 LAB — LIPASE, BLOOD: Lipase: 32 U/L (ref 11–51)

## 2015-07-03 MED ORDER — KETOROLAC TROMETHAMINE 60 MG/2ML IM SOLN
60.0000 mg | Freq: Once | INTRAMUSCULAR | Status: AC
  Administered 2015-07-03: 60 mg via INTRAMUSCULAR
  Filled 2015-07-03: qty 2

## 2015-07-03 NOTE — Discharge Instructions (Signed)
Your bloodwork and urinalysis today were unremarkable. You may take ibuprofen or naproxen as needed for pain. As we discussed, a heating pad/warm towel might help to minimize inflammation. If you experience new or concerning symptoms return to the ER.  Please obtain all of your results from medical records or have your doctors office obtain the results - share them with your doctor - you should be seen at your doctors office in the next 2 days. Call today to arrange your follow up. Take the medications as prescribed. Please review all of the medicines and only take them if you do not have an allergy to them. Please be aware that if you are taking birth control pills, taking other prescriptions, ESPECIALLY ANTIBIOTICS may make the birth control ineffective - if this is the case, either do not engage in sexual activity or use alternative methods of birth control such as condoms until you have finished the medicine and your family doctor says it is OK to restart them. If you are on a blood thinner such as COUMADIN, be aware that any other medicine that you take may cause the coumadin to either work too much, or not enough - you should have your coumadin level rechecked in next 7 days if this is the case.  ?  It is also a possibility that you have an allergic reaction to any of the medicines that you have been prescribed - Everybody reacts differently to medications and while MOST people have no trouble with most medicines, you may have a reaction such as nausea, vomiting, rash, swelling, shortness of breath. If this is the case, please stop taking the medicine immediately and contact your physician.  ?  You should return to the ER if you develop severe or worsening symptoms.

## 2015-07-03 NOTE — ED Provider Notes (Signed)
CSN: [id number]     Arrival date & time 07/03/15  1311 History   First MD Initiated Contact with Patient 07/03/15 1557     Chief Complaint  Patient presents with  . Abdominal Pain   HPI   Grant Valdez is an 64 y.o. male with history of MS, CAD s/p 2 vessel CABG, HTN, HLD, and carotid artery dz s/p endartarectomy who presents to the ED for evaluation of pain on his R side. He states he was in his usual state of health until 3:00AM this morning when he was woken from sleep due to a sharp pain in his right side. He states the pain is a focal area underneath his right ribs. He reports the pain is worse with deep breaths and with certain movements. Denies radiation of the pain to his back, abdomen, groin. Denies any associated CP, SOB, N/V/D, fever, chills. Denies urinary symptoms. States nothing like this has ever happened before. He states he was cleaning his floors two days ago but otherwise cannot think of any recent heavy lifting, injury, or trauma.   Past Medical History  Diagnosis Date  . MS (multiple sclerosis) ([place])     diagnosed in 1989. He was treated with prednisone followed by betaseron which was gradually tapered because his LFTs went up and it resolved off therapy. Likely he is remained in remission for years  . HLD (hyperlipidemia)   . S/P right inguinal herniorrhaphy   . TIA (transient ischemic attack)   . CAD (coronary artery disease)     had 2 arteries tented four years ago sees Dr. Dannielle Burn  . HTN (hypertension)     Sees Dr. Woody Seller  . Depression   . Anxiety   . GERD (gastroesophageal reflux disease)   . Headache(784.0)     migraines hx of  . Neuromuscular disorder [facility])     has Multiple Sclerosis, sees Dr. Leta Baptist  . Stroke [facility])     "patient states has had several TIA's, last one 05/27/12"  . Carotid artery occlusion   . Diabetes mellitus without complication [facility])    Past Surgical History  Procedure Laterality Date  . Colonoscopy  March 12, 2006    Had 2 polyps  removed; one was an adenoma and the other one was hyperplastic  . Stents    . Varicose removal from scrotum  1982  . Hydrocele excision  2011  . Vasectomy  1989  . Hernia repair  age 25  . Endarterectomy  06/10/2012    Procedure: ENDARTERECTOMY CAROTID;  Surgeon: Rosetta Posner, MD;  Location: [place];  Service: Vascular;  Laterality: Right;  With Dacron Patch Angioplasty  . Carotid endarterectomy    . Colonoscopy N/A 10/09/2012    Procedure: COLONOSCOPY;  Surgeon: Rogene Houston, MD;  [facility];  Service: Endoscopy;  Laterality: N/A;  1030-rescheduled to 12:00pm per [name]   Family History  Problem Relation Age of Onset  . Diabetes Mother   . Hypertension Mother   . Heart disease Mother     Before age 93  . Hyperlipidemia Mother   . Heart attack Father   . Lung cancer Father   . Cancer Father     Lung  . Heart disease Father     Before age 54  . Hypertension Son    Social History  Substance Use Topics  . Smoking status: Former Smoker -- 2.00 packs/day for 20 years    Types: Cigarettes    Quit date: 08/21/1993  .  Smokeless tobacco: Never Used  . Alcohol Use: No    Review of Systems  All other systems reviewed and are negative.     Allergies  Review of patient's allergies indicates no known allergies.  Home Medications   Prior to Admission medications   Medication Sig Start Date End Date Taking? Authorizing Provider  Aspirin-Dipyridamole (AGGRENOX PO) Take by mouth 2 (two) times daily.    Historical Provider, MD  buPROPion (WELLBUTRIN XL) 300 MG 24 hr tablet Take 300 mg by mouth daily.    Historical Provider, MD  Cholecalciferol ([employer] VIT D 5000 HIGH-POTENCY PO) Take by mouth daily.    Historical Provider, MD  Choline Fenofibrate (FENOFIBRIC ACID) 135 MG CPDR daily. 07/13/14   Historical Provider, MD  Dimethyl Fumarate (TECFIDERA) 240 MG CPDR Take 1 capsule (240 mg total) by mouth 2 (two) times daily. 02/03/15   Penni Bombard, MD  ezetimibe (ZETIA) 10 MG  tablet Take 10 mg by mouth daily.      Historical Provider, MD  LORazepam (ATIVAN) 0.5 MG tablet Take 0.5 mg by mouth 2 (two) times daily as needed. nerves    Historical Provider, MD  metoprolol (LOPRESSOR) 50 MG tablet Take 25 mg by mouth 2 (two) times daily.     Historical Provider, MD  Multiple Vitamins-Minerals (MULTIVITAL) tablet Take 1 tablet by mouth daily.      Historical Provider, MD  Omega-3 Fatty Acids (FISH OIL) 1000 MG CAPS Take 1 capsule by mouth 2 (two) times daily.     Historical Provider, MD  omeprazole (PRILOSEC) 20 MG capsule Take 20 mg by mouth daily.      Historical Provider, MD  pravastatin (PRAVACHOL) 20 MG tablet Take 20 mg by mouth daily.    Historical Provider, MD   BP 145/79 mmHg  Pulse 59  Temp(Src) 97.5 F (36.4 C) (Oral)  Resp 20  Ht 6' (1.829 m)  Wt 228 lb (103.42 kg)  BMI 30.92 kg/m2  SpO2 98% Physical Exam  Constitutional: He is oriented to person, place, and time. No distress.  HENT:  Right Ear: External ear normal.  Left Ear: External ear normal.  Nose: Nose normal.  Mouth/Throat: Oropharynx is clear and moist. No oropharyngeal exudate.  Eyes: Conjunctivae and EOM are normal. Pupils are equal, round, and reactive to light.  Neck: Normal range of motion. Neck supple. No tracheal deviation present.  Cardiovascular: Normal rate, regular rhythm, normal heart sounds and intact distal pulses.   No murmur heard. Pulmonary/Chest: Effort normal and breath sounds normal. No respiratory distress. He has no wheezes.  Abdominal: Soft. Bowel sounds are normal. He exhibits no distension and no mass. There is no rebound, no guarding, no CVA tenderness, no tenderness at McBurney's point and negative Murphy's sign.    Musculoskeletal: Normal range of motion. He exhibits no edema.  Lymphadenopathy:    He has no cervical adenopathy.  Neurological: He is alert and oriented to person, place, and time. No cranial nerve deficit. Coordination normal.  Skin: Skin is warm  and dry. He is not diaphoretic.  Psychiatric: He has a normal mood and affect.  Nursing note and vitals reviewed.   ED Course  Procedures (including critical care time) Labs Review Labs Reviewed  COMPREHENSIVE METABOLIC PANEL - Abnormal; Notable for the following:    AST 47 (*)    All other components within normal limits  URINALYSIS, ROUTINE W REFLEX MICROSCOPIC (NOT AT [facility]) - Abnormal; Notable for the following:    APPearance HAZY (*)  Hgb urine dipstick TRACE (*)    All other components within normal limits  URINE MICROSCOPIC-ADD ON - Abnormal; Notable for the following:    Bacteria, UA FEW (*)    All other components within normal limits  CBC  LIPASE, BLOOD    Imaging Review No results found. I have personally reviewed and evaluated these images and lab results as part of my medical decision-making.   EKG Interpretation None      MDM   Final diagnoses:  Right sided abdominal pain    I suspect pt's pain is musculoskeletal, potentially from scrubbing the floors on his hands and knees a couple days ago. There is no significant tenderness on exam. Clinically I have a low suspicion for kidney stone, cholecystitis, appendicitis. Will check CBC, CMP, UA, lipase. Will give toradol.   Workup unremarkable. Pt has trace hgb and few bacteria but has no urinary complaints. Imaging not warranted at this time. Encouraged pt to use ibuprofen or naproxen for symptom relief and f/u with PCP. Return precautions given.   Anne Ng, Hershal Coria 07/03/15 2033  Noemi Chapel, MD 07/04/15 [phone]

## 2015-07-03 NOTE — ED Provider Notes (Signed)
64 year old male who awoke this morning approximately 14 hours ago with pain in the right upper quadrant. This is a sharp pain, it is worse with palpation over one single area, it is not worse with eating, there is no nausea vomiting or diarrhea associated with it. On exam the patient has a area of tenderness about the size of a quarter located at the costal margin on the right upper quadrant at the anterior axillary line. There is no significant abdominal tenderness otherwise, he appears well, there is no carnett's sign.  Labs unremarkable, patient appears well, can follow up outpatient. Anti-inflammatories, return for biliary colic type symptoms.  Medical screening examination/treatment/procedure(s) were conducted as a shared visit with non-physician practitioner(s) and myself.  I personally evaluated the patient during the encounter.  Clinical Impression:   Final diagnoses:  Right sided abdominal pain         Noemi Chapel, MD 07/04/15 [phone]

## 2015-07-03 NOTE — ED Notes (Signed)
Pain to right lateral abdomen, rates pain 9-10/10.  Denies injury.

## 2015-07-06 ENCOUNTER — Other Ambulatory Visit: Payer: Self-pay

## 2015-07-06 MED ORDER — DIMETHYL FUMARATE 240 MG PO CPDR: 1.0000 | DELAYED_RELEASE_CAPSULE | Freq: Two times a day (BID) | ORAL | Status: DC

## 2015-07-07 ENCOUNTER — Encounter: Payer: Self-pay | Admitting: Diagnostic Neuroimaging

## 2015-07-07 ENCOUNTER — Ambulatory Visit (INDEPENDENT_AMBULATORY_CARE_PROVIDER_SITE_OTHER): Payer: Medicare Other | Admitting: Diagnostic Neuroimaging

## 2015-07-07 VITALS — BP 150/87 | HR 64 | Ht 72.0 in | Wt 232.0 lb

## 2015-07-07 DIAGNOSIS — R269 Unspecified abnormalities of gait and mobility: Secondary | ICD-10-CM

## 2015-07-07 DIAGNOSIS — G35 Multiple sclerosis: Secondary | ICD-10-CM

## 2015-07-07 NOTE — Patient Instructions (Signed)
Thank you for coming to see [place] at [facility]. I hope we have been able to provide you high quality care today.  You may receive a patient satisfaction survey over the next few weeks. We would appreciate your feedback and comments so that we may continue to improve ourselves and the health of our patients.  - I will check MRI and vitamin D level   ~~~~~~~~~~~~~~~~~~~~~~~~~~~~~~~~~~~~~~~~~~~~~~~~~~~~~~~~~~~~~~~~~  DR. [name]'S GUIDE TO HAPPY AND HEALTHY LIVING These are some of my general health and wellness recommendations. Some of them may apply to you better than others. Please use common sense as you try these suggestions and feel free to ask me any questions.   ACTIVITY/FITNESS Mental, social, emotional and physical stimulation are very important for brain and body health. Try learning a new activity (arts, music, language, sports, games).  Keep moving your body to the best of your abilities. You can do this at home, inside or outside, the park, community center, gym or anywhere you like. Consider a physical therapist or personal trainer to get started. Consider the app Sworkit. Fitness trackers such as smart-watches, smart-phones or Fitbits can help as well.   NUTRITION Eat more plants: colorful vegetables, nuts, seeds and berries.  Eat less sugar, salt, preservatives and processed foods.  Avoid toxins such as cigarettes and alcohol.  Drink water when you are thirsty. Warm water with a slice of lemon is an excellent morning drink to start the day.  Consider these websites for more information The Nutrition Source (https://www.henry-hernandez.biz/) Precision Nutrition (WindowBlog.ch)   RELAXATION Consider practicing mindfulness meditation or other relaxation techniques such as deep breathing, prayer, yoga, tai chi, massage. See website mindful.org or the apps Headspace or Calm to help get started.   SLEEP Try  to get at least 7-8+ hours sleep per day. Regular exercise and reduced caffeine will help you sleep better. Practice good sleep hygeine techniques. See website sleep.org for more information.   PLANNING Prepare estate planning, living will, healthcare POA documents. Sometimes this is best planned with the help of an attorney. Theconversationproject.org and agingwithdignity.org are excellent resources.

## 2015-07-07 NOTE — Progress Notes (Signed)
[facility]  PATIENT: Grant Valdez DOB: [birth year]  REFERRING CLINICIAN:  HISTORY FROM: patient  REASON FOR VISIT: follow up   HISTORICAL  CHIEF COMPLAINT:  Chief Complaint  Patient presents with  . Multiple sclerosis    rm 7  . Follow-up    6 month    HISTORY OF PRESENT ILLNESS:   UPDATE 07/07/15: No new issues. Tolerating meds. Overall doing well.  UPDATE 01/05/15: Since last visit, overall stable. Still notes some balance difficulty. He is trying to increase his walking back up to 2 miles per day. Tolerating meds. No new issues.  UPDATE 07/14/14 (LL): Patient failed to follow up for a year, was supposed to follow up in 3 months after last visit. He did not want to do PT. He has been well on Tecfidera with only flushing side effect. He presents today to have labs and get refills for his medication. Has fallen 2-3 times in the last year, no injuries. Complaints of bilateral finger numbness and paresthesias, slower cognitive processing time, fatigue. MMSE 28/30, AFT 18.  UPDATE 06/17/13: Patient presents for followup. Since last visit he has fallen 4 times including breaking his left foot, almost 4 months ago. Otherwise patient is stable. He still has issues with fatigue depression anxiety.  UPDATE 01/17/13: patient returns for urgent followup. Over past 3 weeks, patient has had increasing dizziness, balance difficulty, urination control problems. He feels some additional numbness or weakness in his legs. No difficulty with his hands or arms. No vision changes, slurred speech, trouble talking. Wife notes some more mood swings, irritability, depression, anxiety, attention and focus problems. He also has had more difficulty with hearing.  UPDATE 09/30/12: Since last visit, doing well. Started tecfidera on Jul 21, 2012. Initially flushing and GI sxs, but have eased up. No new MS flares or sxs.  UPDATE 06/19/12: S/p right CEA on 06/10/12. No complications. No new  stroke/TIA symptoms. WIll proceed with MS treatment.   UPDATE 05/01/12: Since last visit, had MRI brain, c and t spine, confirming chronic MS. On 04/08/12, had sudden onset dizziness, seeing floaters, and left face arm leg numbness x 10 minutes. Didn't tell anyone for 4 days. Had full recovery. Today carotid u/s shows right ICA stenosis (50-69%). Also with fatigue, malaise. Thinking about disability.   PRIOR HPI: 64 year old right-handed male with history of hypertension, hypercholesteremia, coronary artery disease, depression, anxiety, multiple sclerosis, TIA, here for evaluation and management of MS. In 1989, patient developed numbness spots in the back. He was evaluated by Dr. Rosiland Oz ([place]), with MRI and lumbar puncture, and given a diagnosis of MS. We do not have those records to review today. 2 years later he transferred care to Dr. Aggie Hacker [facility]), who treated him with prednisone. Over these years she developed some staggering, balance difficulty, vision problems. In 1995 he was started on Betaseron. Over the next 10-15 years he was on and off of Betaseron, do to fluctuation in liver function testing. Patient last saw Dr. Aggie Hacker in 2011. He saw a different neurologist at [facility] in 2012. Since that time he has not seen a neurologist. His last MRI was around 2000 102,011, which is apparently stable. He weaned himself off of Betaseron in 2011 because he felt that his symptoms weren't fairly mild.  In 2013 he is started to develop increasing fatigue, balance difficulty, numbness in his hands and feet. Also having trouble with coordination.   REVIEW OF SYSTEMS: Full 14 system review of systems performed and  notable only for memory loss dizziness fatigue hearing loss drooling.  ALLERGIES: No Known Allergies  HOME MEDICATIONS: Outpatient Prescriptions Prior to Visit  Medication Sig Dispense Refill  . buPROPion (WELLBUTRIN XL) 300 MG 24 hr tablet Take 300 mg by mouth daily.    .  cholecalciferol (VITAMIN D) 1000 UNITS tablet Take 1,000 Units by mouth daily.    . Choline Fenofibrate (FENOFIBRIC ACID) 135 MG CPDR Take 1 capsule by mouth daily.   0  . citalopram (CELEXA) 20 MG tablet Take 20 mg by mouth daily.  0  . Dimethyl Fumarate (TECFIDERA) 240 MG CPDR Take 1 capsule (240 mg total) by mouth 2 (two) times daily. 60 capsule 0  . dipyridamole-aspirin (AGGRENOX) 200-25 MG 12hr capsule Take 1 capsule by mouth 2 (two) times daily.  0  . LORazepam (ATIVAN) 0.5 MG tablet Take 0.5 mg by mouth 2 (two) times daily. nerves    . metoprolol (LOPRESSOR) 50 MG tablet Take 25 mg by mouth 2 (two) times daily.     . Multiple Vitamin (MULTIVITAMIN WITH MINERALS) TABS tablet Take 1 tablet by mouth daily.    . Omega-3 Fatty Acids (FISH OIL) 1000 MG CAPS Take 1 capsule by mouth 2 (two) times daily.     Marland Kitchen omeprazole (PRILOSEC) 20 MG capsule Take 20 mg by mouth daily.      . pravastatin (PRAVACHOL) 20 MG tablet Take 20 mg by mouth every evening.      No facility-administered medications prior to visit.    PAST MEDICAL HISTORY: Past Medical History  Diagnosis Date  . MS (multiple sclerosis) ([place])     diagnosed in 1989. He was treated with prednisone followed by betaseron which was gradually tapered because his LFTs went up and it resolved off therapy. Likely he is remained in remission for years  . HLD (hyperlipidemia)   . S/P right inguinal herniorrhaphy   . TIA (transient ischemic attack)   . CAD (coronary artery disease)     had 2 arteries tented four years ago sees Dr. Dannielle Burn  . HTN (hypertension)     Sees Dr. Woody Seller  . Depression   . Anxiety   . GERD (gastroesophageal reflux disease)   . Headache(784.0)     migraines hx of  . Neuromuscular disorder [facility])     has Multiple Sclerosis, sees Dr. Leta Baptist  . Stroke [facility])     "patient states has had several TIA's, last one 05/27/12"  . Carotid artery occlusion   . Diabetes mellitus without complication ([place])     PAST SURGICAL  HISTORY: Past Surgical History  Procedure Laterality Date  . Colonoscopy  March 12, 2006    Had 2 polyps removed; one was an adenoma and the other one was hyperplastic  . Stents    . Varicose removal from scrotum  1982  . Hydrocele excision  2011  . Vasectomy  1989  . Hernia repair  age 28  . Endarterectomy  06/10/2012    Procedure: ENDARTERECTOMY CAROTID;  Surgeon: Rosetta Posner, MD;  Location: [place];  Service: Vascular;  Laterality: Right;  With Dacron Patch Angioplasty  . Carotid endarterectomy    . Colonoscopy N/A 10/09/2012    Procedure: COLONOSCOPY;  Surgeon: Rogene Houston, MD;  [facility];  Service: Endoscopy;  Laterality: N/A;  1030-rescheduled to 12:00pm per [name]    FAMILY HISTORY: Family History  Problem Relation Age of Onset  . Diabetes Mother   . Hypertension Mother   . Heart disease  Mother     Before age 110  . Hyperlipidemia Mother   . Heart attack Father   . Lung cancer Father   . Cancer Father     Lung  . Heart disease Father     Before age 55  . Hypertension Son     SOCIAL HISTORY:  Social History   Social History  . Marital Status: Married    Spouse Name: Everhett Salsman  . Number of Children: 2  . Years of Education: 62yrs   Occupational History  .      [employer]   Social History Main Topics  . Smoking status: Former Smoker -- 2.00 packs/day for 20 years    Types: Cigarettes    Quit date: 08/21/1993  . Smokeless tobacco: Never Used  . Alcohol Use: No  . Drug Use: No  . Sexual Activity: Not on file   Other Topics Concern  . Not on file   Social History Narrative   Full time. Married. Works at [employer].    Pt lives at home with his spouse.   Caffeine Use: none     PHYSICAL EXAM  Filed Vitals:   07/07/15 1018  BP: 150/87  Pulse: 64  Height: 6' (1.829 m)  Weight: 232 lb (105.235 kg)    Body mass index is 31.46 kg/(m^2).   Visual Acuity Screening   Right eye Left eye Both eyes  Without correction:      With correction: 20/50 20/30     MMSE - Mini Mental State Exam 07/14/2014  Orientation to time 5  Orientation to Place 5  Registration 3  Attention/ Calculation 5  Recall 1  Language- name 2 objects 2  Language- repeat 1  Language- follow 3 step command 3  Language- read & follow direction 1  Write a sentence 1  Copy design 1  Total score 28    GENERAL EXAM: Patient is in no distress; well developed, nourished and groomed; neck is supple  CARDIOVASCULAR: Regular rate and rhythm, no murmurs, no carotid bruits  NEUROLOGIC: MENTAL STATUS: awake, alert, language fluent, comprehension intact, naming intact, fund of knowledge appropriate CRANIAL NERVE: no papilledema on fundoscopic exam, pupils equal and reactive to light, visual fields full to confrontation, extraocular muscles intact, no nystagmus, facial sensation and strength symmetric, hearing intact, palate elevates symmetrically, uvula midline, shoulder shrug symmetric, tongue midline. MOTOR: normal bulk and tone, full strength in the BUE, BLE SENSORY: normal and symmetric to light touch; EXCEPT DECR IN HANDS AND FEET COORDINATION: finger-nose-finger, fine finger movements normal REFLEXES: deep tendon reflexes present and symmetric GAIT/STATION: narrow based gait; SLIGHT DIFF WITH TANDEM GAIT; romberg is negative    DIAGNOSTIC DATA (LABS, IMAGING, TESTING) - I reviewed patient records, labs, notes, testing and imaging myself where available.  Lab Results  Component Value Date   WBC 7.6 07/03/2015   HGB 16.0 07/03/2015   HCT 46.6 07/03/2015   MCV 97.3 07/03/2015   PLT 183 07/03/2015      Component Value Date/Time   NA 137 07/03/2015 1641   NA 143 01/05/2015 0905   K 4.6 07/03/2015 1641   CL 102 07/03/2015 1641   CO2 26 07/03/2015 1641   GLUCOSE 93 07/03/2015 1641   GLUCOSE 96 01/05/2015 0905   BUN 15 07/03/2015 1641   BUN 18 01/05/2015 0905   CREATININE 1.15 07/03/2015 1641   CALCIUM 9.8 07/03/2015 1641    PROT 8.1 07/03/2015 1641   PROT 7.1 01/05/2015 0905  ALBUMIN 4.8 07/03/2015 1641   ALBUMIN 4.7 01/05/2015 0905   AST 47* 07/03/2015 1641   ALT 58 07/03/2015 1641   ALKPHOS 38 07/03/2015 1641   BILITOT 0.9 07/03/2015 1641   BILITOT 0.7 01/05/2015 0905   GFRNONAA >60 07/03/2015 1641   GFRAA >60 07/03/2015 1641   No results found for: CHOL, HDL, LDLCALC, LDLDIRECT, TRIG, CHOLHDL Lab Results  Component Value Date   HGBA1C 5.4 01/17/2013   Lab Results  Component Value Date   VITAMINB12 302 07/14/2014   Lab Results  Component Value Date   TSH 2.320 01/17/2013   VIT D, 25-HYDROXY  Date Value Ref Range Status  07/14/2014 18.1* 30.0 - 100.0 ng/mL Final    Comment:    Vitamin D deficiency has been defined by the [place] practice guideline as a level of serum 25-OH vitamin D less than 20 ng/mL (1,2). The Endocrine Society went on to further define vitamin D insufficiency as a level between 21 and 29 ng/mL (2). 1. IOM (Institute of Medicine). 2010. Dietary reference    intakes for calcium and D. [place]: The    [employer]. 2. Holick MF, Binkley Hughes, Bischoff-Ferrari HA, et al.    Evaluation, treatment, and prevention of vitamin D    deficiency: an Endocrine Society clinical practice    guideline. JCEM. 2011 Jul; 96(7):1911-30.    LYMPHOCYTES ABSOLUTE  Date Value Ref Range Status  01/05/2015 0.8 0.7 - 3.1 x10E3/uL Final  07/14/2014 1.0 0.7 - 3.1 x10E3/uL Final  06/17/2013 1.0 0.7 - 3.1 x10E3/uL Final    02/06/13 MRI brain (with and without) demonstrating: 1. Multiple periventricular and subcortical chronic demyelinating plaques. Chronic plaque noted in the right medulla. 2. No acute plaques. 3. No change from MRI on 04/02/12.  02/06/13 MRI thoracic spine (with and without) demonstrating: 1. Multiple chronic demyelinating plaques at T5, T6, T7 and T9-10. 2. No acute plaques. 3. No change from MRI on  04/02/12.  01/06/15 JCV ab - 1.33 H positive    ASSESSMENT AND PLAN  64 y.o. year old male here with multiple sclerosis and prior TIA.  MS --> Doing well on tecfidera. He reports being steroid intolerant in past (hallucinations, stomach irritation, weight gain).   TIA --> due to hypertension, hypercholesterolemia. Had right brain TIA on 04/08/12, with right ICA stenosis, s/p right CEA 06/10/12.   PLAN: 1. continue tecfidera for multiple sclerosis 2. continue stroke prevention with aggrenox, statin, BP control per PCP 3. Labs and MRI   Orders Placed This Encounter  Procedures  . MR Brain W Wo Contrast  . VITAMIN D 25 Hydroxy (Vit-D Deficiency, Fractures)   Return in about 6 months (around 01/04/2016).     Penni Bombard, MD [id number], [id number] AM Certified in Neurology, Neurophysiology and Neuroimaging  [facility] [address], [place], [place] [address] [phone]

## 2015-07-08 ENCOUNTER — Telehealth: Payer: Self-pay | Admitting: *Deleted

## 2015-07-08 LAB — VITAMIN D 25 HYDROXY (VIT D DEFICIENCY, FRACTURES): VIT D 25 HYDROXY: 23.8 ng/mL — AB (ref 30.0–100.0)

## 2015-07-08 NOTE — Telephone Encounter (Signed)
Left detailed VM for patient informing him of Dr [employer] instruction. Left this caller's name, number for any questions.

## 2015-07-08 NOTE — Telephone Encounter (Signed)
-----   Message from Penni Bombard, MD sent at 07/08/2015  1:18 PM EST ----- Regarding: RE: Re: lab If he is taking 1000units per day, he should increase to 2000units per day.  If is is not on vit D, then start 1000units per day.  VR:P   ----- Message -----    From: Minna Antis, RN    Sent: 07/08/2015  11:56 AM      To: Penni Bombard, MD Subject: Re: lab                                        His Vit D is low, 23.8. Let me know how to advise he supplement.  TY, [place]

## 2015-07-23 ENCOUNTER — Other Ambulatory Visit: Payer: Medicare Other

## 2015-07-25 ENCOUNTER — Other Ambulatory Visit: Payer: Self-pay

## 2015-07-25 MED ORDER — DIMETHYL FUMARATE 240 MG PO CPDR: 1.0000 | DELAYED_RELEASE_CAPSULE | Freq: Two times a day (BID) | ORAL | Status: DC

## 2015-07-26 ENCOUNTER — Ambulatory Visit
Admission: RE | Admit: 2015-07-26 | Discharge: 2015-07-26 | Disposition: A | Discharge status: Home / Self Care | Payer: Medicare Other | Source: Ambulatory Visit | Attending: Diagnostic Neuroimaging | Admitting: Diagnostic Neuroimaging

## 2015-07-26 DIAGNOSIS — G35 Multiple sclerosis: Secondary | ICD-10-CM | POA: Diagnosis not present

## 2015-07-26 DIAGNOSIS — R269 Unspecified abnormalities of gait and mobility: Secondary | ICD-10-CM

## 2015-07-26 MED ORDER — GADOBENATE DIMEGLUMINE 529 MG/ML IV SOLN
20.0000 mL | Freq: Once | INTRAVENOUS | Status: AC | PRN
  Administered 2015-07-26: 20 mL via INTRAVENOUS

## 2015-07-27 ENCOUNTER — Encounter: Payer: Self-pay | Admitting: Family

## 2015-07-28 ENCOUNTER — Other Ambulatory Visit: Payer: Self-pay | Admitting: *Deleted

## 2015-07-28 DIAGNOSIS — I6523 Occlusion and stenosis of bilateral carotid arteries: Secondary | ICD-10-CM

## 2015-07-28 DIAGNOSIS — Z48812 Encounter for surgical aftercare following surgery on the circulatory system: Secondary | ICD-10-CM

## 2015-08-02 ENCOUNTER — Encounter: Payer: Self-pay | Admitting: Family

## 2015-08-02 ENCOUNTER — Ambulatory Visit (INDEPENDENT_AMBULATORY_CARE_PROVIDER_SITE_OTHER): Payer: Medicare Other | Admitting: Family

## 2015-08-02 ENCOUNTER — Ambulatory Visit (HOSPITAL_COMMUNITY)
Admission: RE | Admit: 2015-08-02 | Discharge: 2015-08-02 | Disposition: A | Discharge status: Home / Self Care | Payer: Medicare Other | Source: Ambulatory Visit | Attending: Family | Admitting: Family

## 2015-08-02 ENCOUNTER — Ambulatory Visit: Payer: BC Managed Care – PPO | Admitting: Family

## 2015-08-02 ENCOUNTER — Other Ambulatory Visit (HOSPITAL_COMMUNITY): Payer: BC Managed Care – PPO

## 2015-08-02 VITALS — BP 135/81 | HR 64 | Temp 97.7°F | Resp 16 | Ht 72.0 in | Wt 233.4 lb

## 2015-08-02 DIAGNOSIS — I6523 Occlusion and stenosis of bilateral carotid arteries: Secondary | ICD-10-CM | POA: Insufficient documentation

## 2015-08-02 DIAGNOSIS — Z48812 Encounter for surgical aftercare following surgery on the circulatory system: Secondary | ICD-10-CM | POA: Diagnosis not present

## 2015-08-02 DIAGNOSIS — E119 Type 2 diabetes mellitus without complications: Secondary | ICD-10-CM | POA: Insufficient documentation

## 2015-08-02 DIAGNOSIS — I10 Essential (primary) hypertension: Secondary | ICD-10-CM | POA: Diagnosis not present

## 2015-08-02 DIAGNOSIS — I6522 Occlusion and stenosis of left carotid artery: Secondary | ICD-10-CM

## 2015-08-02 DIAGNOSIS — E785 Hyperlipidemia, unspecified: Secondary | ICD-10-CM | POA: Insufficient documentation

## 2015-08-02 DIAGNOSIS — Z8679 Personal history of other diseases of the circulatory system: Secondary | ICD-10-CM

## 2015-08-02 NOTE — Progress Notes (Signed)
Chief Complaint: Extracranial Carotid Artery Stenosis   History of Present Illness  Grant Valdez is a 64 y.o. male patient of Dr. Donnetta Hutching who is s/p right carotid endarterectomy in October of 2013. He has had progression of MS and has had to go out on disability. He feels his MS is stable at this point.  He returns today for follow up.  He had two TIA's several years before the CEA, about 2 weeks apart; both manifested as expressive aphasia, denies hemiparesis, denies monocular loss of vision, denies unilateral facial drooping. He denies any subsequent TIA's or stroke.   The patient denies New Medical or Surgical History.  Pt Diabetic: No Pt smoker: former smoker, quit in 1995  Pt meds include: Statin : Yes ASA: Yes Other anticoagulants/antiplatelets: no   Past Medical History  Diagnosis Date  . MS (multiple sclerosis) ([place])     diagnosed in 1989. He was treated with prednisone followed by betaseron which was gradually tapered because his LFTs went up and it resolved off therapy. Likely he is remained in remission for years  . HLD (hyperlipidemia)   . S/P right inguinal herniorrhaphy   . TIA (transient ischemic attack)   . CAD (coronary artery disease)     had 2 arteries tented four years ago sees Dr. Dannielle Burn  . HTN (hypertension)     Sees Dr. Woody Seller  . Depression   . Anxiety   . GERD (gastroesophageal reflux disease)   . Headache(784.0)     migraines hx of  . Neuromuscular disorder [facility])     has Multiple Sclerosis, sees Dr. Leta Baptist  . Stroke [facility])     "patient states has had several TIA's, last one 05/27/12"  . Carotid artery occlusion   . Diabetes mellitus without complication [facility])     Social History Social History  Substance Use Topics  . Smoking status: Former Smoker -- 2.00 packs/day for 20 years    Types: Cigarettes    Quit date: 08/21/1993  . Smokeless tobacco: Never Used  . Alcohol Use: No    Family History Family History  Problem Relation Age of  Onset  . Diabetes Mother   . Hypertension Mother   . Heart disease Mother     Before age 20  . Hyperlipidemia Mother   . Heart attack Father   . Lung cancer Father   . Cancer Father     Lung  . Heart disease Father     Before age 48  . Hypertension Son     Surgical History Past Surgical History  Procedure Laterality Date  . Colonoscopy  March 12, 2006    Had 2 polyps removed; one was an adenoma and the other one was hyperplastic  . Stents    . Varicose removal from scrotum  1982  . Hydrocele excision  2011  . Vasectomy  1989  . Hernia repair  age 12  . Endarterectomy  06/10/2012    Procedure: ENDARTERECTOMY CAROTID;  Surgeon: Rosetta Posner, MD;  Location: [place];  Service: Vascular;  Laterality: Right;  With Dacron Patch Angioplasty  . Carotid endarterectomy    . Colonoscopy N/A 10/09/2012    Procedure: COLONOSCOPY;  Surgeon: Rogene Houston, MD;  [facility];  Service: Endoscopy;  Laterality: N/A;  1030-rescheduled to 12:00pm per [name]    No Known Allergies  Current Outpatient Prescriptions  Medication Sig Dispense Refill  . buPROPion (WELLBUTRIN XL) 300 MG 24 hr tablet Take 300 mg by mouth  daily.    . cholecalciferol (VITAMIN D) 1000 UNITS tablet Take 1,000 Units by mouth daily.    . Choline Fenofibrate (FENOFIBRIC ACID) 135 MG CPDR Take 1 capsule by mouth daily.   0  . citalopram (CELEXA) 20 MG tablet Take 20 mg by mouth daily.  0  . Dimethyl Fumarate (TECFIDERA) 240 MG CPDR Take 1 capsule (240 mg total) by mouth 2 (two) times daily. 60 capsule 6  . dipyridamole-aspirin (AGGRENOX) 200-25 MG 12hr capsule Take 1 capsule by mouth 2 (two) times daily.  0  . LORazepam (ATIVAN) 0.5 MG tablet Take 0.5 mg by mouth 2 (two) times daily. nerves    . metoprolol (LOPRESSOR) 50 MG tablet Take 25 mg by mouth 2 (two) times daily.     . Multiple Vitamin (MULTIVITAMIN WITH MINERALS) TABS tablet Take 1 tablet by mouth daily.    . Omega-3 Fatty Acids (FISH OIL) 1000 MG CAPS Take 1  capsule by mouth 2 (two) times daily.     Marland Kitchen omeprazole (PRILOSEC) 20 MG capsule Take 20 mg by mouth daily.      . pravastatin (PRAVACHOL) 20 MG tablet Take 20 mg by mouth every evening.      No current facility-administered medications for this visit.    Review of Systems : See HPI for pertinent positives and negatives.  Physical Examination  Filed Vitals:   08/02/15 1310 08/02/15 1311  BP: 124/81 135/81  Pulse: 64   Temp: 97.7 F (36.5 C)   TempSrc: Oral   Resp: 16   Height: 6' (1.829 m)   Weight: 233 lb 6.4 oz (105.87 kg)    Body mass index is 31.65 kg/(m^2).  General: WDWN male in NAD GAIT: normal Eyes: PERRLA Pulmonary: Non-labored, CTAB, no rales,  rhonchi, or wheezing.  Cardiac: regular rhythm,no detected murmur.  VASCULAR EXAM Carotid Bruits Right Left   Negative Negative   Aorta is not palpable. Radial pulses are 2+ palpable and equal.      LE Pulses Right Left   POPLITEAL not palpable  not palpable   POSTERIOR TIBIAL  not palpable   not palpable    DORSALIS PEDIS  ANTERIOR TIBIAL palpable  palpable     Gastrointestinal: soft, nontender, BS WNL, no r/g, no palpated masses other than sclerotic areas that were beta seron injections sites.   Musculoskeletal: No muscle atrophy/wasting. M/S 5/5 throughout, Extremities without ischemic changes.  Neurologic: A&O X 3; Appropriate Affect,  Speech is normal CN 2-12 intact, Pain and light touch intact in extremities, Motor exam as listed above.           Non-Invasive Vascular Imaging CAROTID DUPLEX 08/02/2015   Right ICA: patent CEA site with no restenosis. Left ICA: 1 - 39 % stenosis. No significant change compared to study on 07/31/14.   Assessment: Grant Valdez is a 64 y.o. male who is s/p right carotid endarterectomy  in October of 2013. He had two TIA's several years before the CEA, about 2 weeks apart; both manifested as expressive aphasia. He denies any subsequent TIA's or stroke.  Today's carotid duplex suggests a patent right CEA with no restenosis and <40% left ICA stenosis. Fortunately he does not have DM and he quit smoking in the 1990's.   Plan: Follow-up in 1 year with Carotid Duplex scan.   I discussed in depth with the patient the nature of atherosclerosis, and emphasized the importance of maximal medical management including strict control of blood pressure, blood glucose, and lipid levels, obtaining  regular exercise, and continued cessation of smoking.  The patient is aware that without maximal medical management the underlying atherosclerotic disease process will progress, limiting the benefit of any interventions. The patient was given information about stroke prevention and what symptoms should prompt the patient to seek immediate medical care. Thank you for allowing [place] to participate in this patient's care.  Clemon Chambers, RN, MSN, FNP-C [facility] Office: [phone]  Clinic Physician: Trula Slade   08/02/2015 1:26 PM

## 2015-08-02 NOTE — Patient Instructions (Signed)
Stroke Prevention Some medical conditions and behaviors are associated with an increased chance of having a stroke. You may prevent a stroke by making healthy choices and managing medical conditions. HOW CAN I REDUCE MY RISK OF HAVING A STROKE?   Stay physically active. Get at least 30 minutes of activity on most or all days.  Do not smoke. It may also be helpful to avoid exposure to secondhand smoke.  Limit alcohol use. Moderate alcohol use is considered to be:  No more than 2 drinks per day for men.  No more than 1 drink per day for nonpregnant women.  Eat healthy foods. This involves:  Eating 5 or more servings of fruits and vegetables a day.  Making dietary changes that address high blood pressure (hypertension), high cholesterol, diabetes, or obesity.  Manage your cholesterol levels.  Making food choices that are high in fiber and low in saturated fat, trans fat, and cholesterol may control cholesterol levels.  Take any prescribed medicines to control cholesterol as directed by your health care provider.  Manage your diabetes.  Controlling your carbohydrate and sugar intake is recommended to manage diabetes.  Take any prescribed medicines to control diabetes as directed by your health care provider.  Control your hypertension.  Making food choices that are low in salt (sodium), saturated fat, trans fat, and cholesterol is recommended to manage hypertension.  Ask your health care provider if you need treatment to lower your blood pressure. Take any prescribed medicines to control hypertension as directed by your health care provider.  If you are 18-39 years of age, have your blood pressure checked every 3-5 years. If you are 40 years of age or older, have your blood pressure checked every year.  Maintain a healthy weight.  Reducing calorie intake and making food choices that are low in sodium, saturated fat, trans fat, and cholesterol are recommended to manage  weight.  Stop drug abuse.  Avoid taking birth control pills.  Talk to your health care provider about the risks of taking birth control pills if you are over 35 years old, smoke, get migraines, or have ever had a blood clot.  Get evaluated for sleep disorders (sleep apnea).  Talk to your health care provider about getting a sleep evaluation if you snore a lot or have excessive sleepiness.  Take medicines only as directed by your health care provider.  For some people, aspirin or blood thinners (anticoagulants) are helpful in reducing the risk of forming abnormal blood clots that can lead to stroke. If you have the irregular heart rhythm of atrial fibrillation, you should be on a blood thinner unless there is a good reason you cannot take them.  Understand all your medicine instructions.  Make sure that other conditions (such as anemia or atherosclerosis) are addressed. SEEK IMMEDIATE MEDICAL CARE IF:   You have sudden weakness or numbness of the face, arm, or leg, especially on one side of the body.  Your face or eyelid droops to one side.  You have sudden confusion.  You have trouble speaking (aphasia) or understanding.  You have sudden trouble seeing in one or both eyes.  You have sudden trouble walking.  You have dizziness.  You have a loss of balance or coordination.  You have a sudden, severe headache with no known cause.  You have new chest pain or an irregular heartbeat. Any of these symptoms may represent a serious problem that is an emergency. Do not wait to see if the symptoms will   go away. Get medical help at once. Call your local emergency services (911 in [place] Do not drive yourself to the hospital.   This information is not intended to replace advice given to you by your health care provider. Make sure you discuss any questions you have with your health care provider.   Document Released: 09/14/2004 Document Revised: 08/28/2014 Document Reviewed:  02/07/2013 Elsevier Interactive Patient Education 2016 Elsevier Inc.  

## 2015-08-04 ENCOUNTER — Telehealth: Payer: Self-pay | Admitting: Diagnostic Neuroimaging

## 2015-08-04 NOTE — Telephone Encounter (Signed)
Called pt and relayed results per Dr Leta Baptist. Gave [facility] phone number if he has further questions.

## 2015-08-04 NOTE — Telephone Encounter (Signed)
Stable MRI from 01/31/13. Continue current medications. -VRP

## 2015-08-04 NOTE — Telephone Encounter (Signed)
Grant Valdez [phone] ext [phone] )with [facility] called sts pt is on assistance program and needs 3 mth refill for tecfidera instead of 1 mth.

## 2015-08-04 NOTE — Telephone Encounter (Signed)
LVM letting pt know results not ready yet. Will forward message to Dr Leta Baptist and call back once results ready. Gave [facility] phone number if he has further questions.

## 2015-08-04 NOTE — Telephone Encounter (Signed)
Patient is calling to get MRI results. °

## 2015-08-04 NOTE — Telephone Encounter (Signed)
Called [place] back. She said that it is preferred under pt assistance program to get a 90 day supply rather than 1 month. Needs to call back and give verbal ok if Dr Leta Baptist approves.

## 2015-08-05 NOTE — Telephone Encounter (Signed)
Spoke with [facility] and informed her patient may receive 90 day supply of Tecfidera per Dr Leta Baptist.  She stated they received a faxed prescription this morning for Tecfidera 90 day supply; she states they do not need anything further.

## 2015-10-20 HISTORY — PX: EYE SURGERY: SHX253

## 2015-10-26 ENCOUNTER — Encounter: Payer: Self-pay | Admitting: *Deleted

## 2015-10-26 ENCOUNTER — Ambulatory Visit (INDEPENDENT_AMBULATORY_CARE_PROVIDER_SITE_OTHER): Payer: Medicare Other | Admitting: Cardiovascular Disease

## 2015-10-26 ENCOUNTER — Encounter: Payer: Self-pay | Admitting: Cardiovascular Disease

## 2015-10-26 VITALS — BP 133/83 | HR 65 | Ht 72.0 in | Wt 236.0 lb

## 2015-10-26 DIAGNOSIS — Z955 Presence of coronary angioplasty implant and graft: Secondary | ICD-10-CM

## 2015-10-26 DIAGNOSIS — I10 Essential (primary) hypertension: Secondary | ICD-10-CM

## 2015-10-26 DIAGNOSIS — I6523 Occlusion and stenosis of bilateral carotid arteries: Secondary | ICD-10-CM | POA: Diagnosis not present

## 2015-10-26 DIAGNOSIS — R079 Chest pain, unspecified: Secondary | ICD-10-CM

## 2015-10-26 DIAGNOSIS — I25118 Atherosclerotic heart disease of native coronary artery with other forms of angina pectoris: Secondary | ICD-10-CM | POA: Diagnosis not present

## 2015-10-26 DIAGNOSIS — E785 Hyperlipidemia, unspecified: Secondary | ICD-10-CM

## 2015-10-26 MED ORDER — NITROGLYCERIN 0.4 MG SL SUBL
0.4000 mg | SUBLINGUAL_TABLET | SUBLINGUAL | Status: DC | PRN
Start: 2015-10-26 — End: 2019-05-08

## 2015-10-26 NOTE — Patient Instructions (Signed)
   Nitroglycerin as needed for severe chest pain only - new sent to [facility] today. Continue all other medications.   Your physician wants you to follow up in:  1 year.  You will receive a reminder letter in the mail one-two months in advance.  If you don't receive a letter, please call our office to schedule the follow up appointment

## 2015-10-26 NOTE — Progress Notes (Signed)
Patient ID: Grant Valdez, male   DOB: 06-19-1951, 65 y.o.   MRN: [id number]       CARDIOLOGY CONSULT NOTE  Patient ID: Grant Valdez MRN: [id number] DOB/AGE: [birth year] 65 y.o.  Admit date: (Not on file) Primary Physician Glenda Chroman., MD  Reason for Consultation: CAD  HPI: The patient is a 65 year old male with a past medical history significant for coronary artery disease with prior percutaneous coronary intervention (drug-eluting stents to the mid RCA and proximal LAD in 2002), TIAs, right carotid endarterectomy in October 2013, hyperlipidemia, hypertension, and multiple sclerosis.  He has been feeling well and denies exertional chest pain. Since he retired in 2014, he said he does not exercise very much at all and has put on a lot of abdominal fat. Coinciding with that he has become slightly more short of breath with exertion and particularly when bending over. [place] and he is able to run up and down the beach without exertional chest pain. He had some mild right-sided chest pain which he described as superficial and likely related to indigestion approximately 2 weeks ago. His symptoms prior to undergoing stent placement in 2002 were described as "deep, retrosternal chest pain". Said he had his lipids checked by his PCP 2 months ago.  ECG performed in the office today demonstrates normal sinus rhythm with no ischemic ST segment or T-wave abnormalities, nor any arrhythmias.  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   No Known Allergies  Current Outpatient Prescriptions  Medication Sig Dispense Refill  . buPROPion (WELLBUTRIN XL) 300 MG 24 hr tablet Take 300 mg by mouth daily.    . cholecalciferol (VITAMIN D) 1000 UNITS tablet Take 1,000 Units by mouth daily.    . Choline Fenofibrate (FENOFIBRIC ACID) 135 MG CPDR Take 1 capsule by mouth daily.   0  . citalopram (CELEXA) 20 MG tablet Take 20  mg by mouth daily.  0  . Dimethyl Fumarate (TECFIDERA) 240 MG CPDR Take 1 capsule (240 mg total) by mouth 2 (two) times daily. 60 capsule 6  . dipyridamole-aspirin (AGGRENOX) 200-25 MG 12hr capsule Take 1 capsule by mouth 2 (two) times daily.  0  . LORazepam (ATIVAN) 0.5 MG tablet Take 0.5 mg by mouth 2 (two) times daily. nerves    . metoprolol (LOPRESSOR) 50 MG tablet Take 25 mg by mouth 2 (two) times daily.     . Multiple Vitamin (MULTIVITAMIN WITH MINERALS) TABS tablet Take 1 tablet by mouth daily.    . Omega-3 Fatty Acids (FISH OIL) 1000 MG CAPS Take 1 capsule by mouth 2 (two) times daily.     Marland Kitchen omeprazole (PRILOSEC) 20 MG capsule Take 20 mg by mouth daily.      . pravastatin (PRAVACHOL) 40 MG tablet Take 1 tablet by mouth daily.     No current facility-administered medications for this visit.    Past Medical History  Diagnosis Date  . MS (multiple sclerosis) ([place])     diagnosed in 1989. He was treated with prednisone followed by betaseron which was gradually tapered because his LFTs went up and it resolved off therapy. Likely he is remained in remission for years  . HLD (hyperlipidemia)   . S/P right inguinal herniorrhaphy   . TIA (transient ischemic attack)   . CAD (coronary artery disease)     had 2 arteries tented four years ago sees Dr. Dannielle Burn  .  HTN (hypertension)     Sees Dr. Woody Seller  . Depression   . Anxiety   . GERD (gastroesophageal reflux disease)   . Headache(784.0)     migraines hx of  . Neuromuscular disorder [facility])     has Multiple Sclerosis, sees Dr. Leta Baptist  . Stroke [facility])     "patient states has had several TIA's, last one 05/27/12"  . Carotid artery occlusion   . Diabetes mellitus without complication [facility])     Past Surgical History  Procedure Laterality Date  . Colonoscopy  March 12, 2006    Had 2 polyps removed; one was an adenoma and the other one was hyperplastic  . Stents    . Varicose removal from scrotum  1982  . Hydrocele excision  2011  .  Vasectomy  1989  . Hernia repair  age 54  . Endarterectomy  06/10/2012    Procedure: ENDARTERECTOMY CAROTID;  Surgeon: Rosetta Posner, MD;  Location: [place];  Service: Vascular;  Laterality: Right;  With Dacron Patch Angioplasty  . Carotid endarterectomy    . Colonoscopy N/A 10/09/2012    Procedure: COLONOSCOPY;  Surgeon: Rogene Houston, MD;  [facility];  Service: Endoscopy;  Laterality: N/A;  1030-rescheduled to 12:00pm per [name]    Social History   Social History  . Marital Status: Married    Spouse Name: Issac Wilcott  . Number of Children: 2  . Years of Education: 102yrs   Occupational History  .      [employer]   Social History Main Topics  . Smoking status: Former Smoker -- 2.00 packs/day for 20 years    Types: Cigarettes    Start date: 05/23/1968    Quit date: 08/21/1993  . Smokeless tobacco: Never Used  . Alcohol Use: No  . Drug Use: No  . Sexual Activity: Not on file   Other Topics Concern  . Not on file   Social History Narrative   Full time. Married. Works at [employer].    Pt lives at home with his spouse.   Caffeine Use: none     No family history of premature CAD in 1st degree relatives.  Prior to Admission medications   Medication Sig Start Date End Date Taking? Authorizing Provider  buPROPion (WELLBUTRIN XL) 300 MG 24 hr tablet Take 300 mg by mouth daily.   Yes Historical Provider, MD  cholecalciferol (VITAMIN D) 1000 UNITS tablet Take 1,000 Units by mouth daily.   Yes Historical Provider, MD  Choline Fenofibrate (FENOFIBRIC ACID) 135 MG CPDR Take 1 capsule by mouth daily.  07/13/14  Yes Historical Provider, MD  citalopram (CELEXA) 20 MG tablet Take 20 mg by mouth daily. 06/01/15  Yes Historical Provider, MD  Dimethyl Fumarate (TECFIDERA) 240 MG CPDR Take 1 capsule (240 mg total) by mouth 2 (two) times daily. 07/25/15  Yes Penni Bombard, MD  dipyridamole-aspirin (AGGRENOX) 200-25 MG 12hr capsule Take 1 capsule by mouth 2 (two)  times daily. 06/01/15  Yes Historical Provider, MD  LORazepam (ATIVAN) 0.5 MG tablet Take 0.5 mg by mouth 2 (two) times daily. nerves   Yes Historical Provider, MD  metoprolol (LOPRESSOR) 50 MG tablet Take 25 mg by mouth 2 (two) times daily.    Yes Historical Provider, MD  Multiple Vitamin (MULTIVITAMIN WITH MINERALS) TABS tablet Take 1 tablet by mouth daily.   Yes Historical Provider, MD  Omega-3 Fatty Acids (FISH OIL) 1000 MG CAPS Take 1 capsule by mouth 2 (two)  times daily.    Yes Historical Provider, MD  omeprazole (PRILOSEC) 20 MG capsule Take 20 mg by mouth daily.     Yes Historical Provider, MD  pravastatin (PRAVACHOL) 40 MG tablet Take 1 tablet by mouth daily. 10/06/15  Yes Historical Provider, MD     Review of systems complete and found to be negative unless listed above in HPI     Physical exam Blood pressure 133/83, pulse 65, height 6' (1.829 m), weight 236 lb (107.049 kg). General: NAD Neck: No JVD, no thyromegaly or thyroid nodule.  Lungs: Clear to auscultation bilaterally with normal respiratory effort. CV: Nondisplaced PMI. Regular rate and rhythm, normal S1/S2, no S3/S4, no murmur.  No peripheral edema.  No carotid bruit.  Normal pedal pulses.  Abdomen: Soft, obese.  Skin: Intact without lesions or rashes.  Neurologic: Alert and oriented x 3.  Psych: Normal affect. Extremities: No clubbing or cyanosis.  HEENT: Normal.   ECG: Most recent ECG reviewed.  Labs:   Lab Results  Component Value Date   WBC 7.6 07/03/2015   HGB 16.0 07/03/2015   HCT 46.6 07/03/2015   MCV 97.3 07/03/2015   PLT 183 07/03/2015   No results for input(s): NA, K, CL, CO2, BUN, CREATININE, CALCIUM, PROT, BILITOT, ALKPHOS, ALT, AST, GLUCOSE in the last 168 hours.  Invalid input(s): LABALBU No results found for: CKTOTAL, CKMB, CKMBINDEX, TROPONINI No results found for: CHOL No results found for: HDL No results found for: LDLCALC No results found for: TRIG No results found for: CHOLHDL No  results found for: LDLDIRECT       Studies: No results found.  ASSESSMENT AND PLAN:  1. CAD with mid RCA and proximal LAD stents: Stable ischemic heart disease. On beta blocker, Aggrenox, and statin. Will prescribe SL nitroglycerin.  2. Essential HTN: Controlled. No changes.  3. Hyperlipidemia: On statin therapy. Obtain copy of lipids from PCP.  Dispo: f/u 1 year.   Signed: Kate Sable, M.D., F.A.C.C.  10/26/2015, 9:35 AM

## 2015-11-03 ENCOUNTER — Telehealth: Payer: Self-pay | Admitting: Diagnostic Neuroimaging

## 2015-11-03 NOTE — Telephone Encounter (Signed)
Spoke with [place] and gave her verbal refill for Tecfidera.

## 2015-11-03 NOTE — Telephone Encounter (Signed)
[place] x [phone] called requesting refill for Dimethyl Fumarate (TECFIDERA) 240 MG CPDR. Pt has approved for PAP. Fax# [phone]

## 2016-01-04 ENCOUNTER — Telehealth: Payer: Self-pay | Admitting: Diagnostic Neuroimaging

## 2016-01-04 ENCOUNTER — Ambulatory Visit (INDEPENDENT_AMBULATORY_CARE_PROVIDER_SITE_OTHER): Payer: Medicare Other | Admitting: Diagnostic Neuroimaging

## 2016-01-04 ENCOUNTER — Encounter: Payer: Self-pay | Admitting: Diagnostic Neuroimaging

## 2016-01-04 VITALS — BP 129/83 | HR 56 | Ht 72.0 in | Wt 238.4 lb

## 2016-01-04 DIAGNOSIS — R413 Other amnesia: Secondary | ICD-10-CM

## 2016-01-04 DIAGNOSIS — R202 Paresthesia of skin: Secondary | ICD-10-CM

## 2016-01-04 DIAGNOSIS — G35 Multiple sclerosis: Secondary | ICD-10-CM | POA: Diagnosis not present

## 2016-01-04 DIAGNOSIS — R269 Unspecified abnormalities of gait and mobility: Secondary | ICD-10-CM

## 2016-01-04 MED ORDER — DIMETHYL FUMARATE 240 MG PO CPDR: 1.0000 | DELAYED_RELEASE_CAPSULE | Freq: Two times a day (BID) | ORAL | Status: DC

## 2016-01-04 NOTE — Telephone Encounter (Signed)
Grant Valdez Kyer [id number]  SAME [name]  [phone]  RUNNING LATE FOR 5/16 @12 :45PM /5 MIN /

## 2016-01-04 NOTE — Progress Notes (Signed)
[facility]  PATIENT: Grant Valdez DOB: 1951/03/13  REFERRING CLINICIAN:  HISTORY FROM: patient  REASON FOR VISIT: follow up   HISTORICAL  CHIEF COMPLAINT:  Chief Complaint  Patient presents with  . Multiple Sclerosis    rm 7, wife- Grant Valdez, 07/2015 MRI brain, "no new problems"  . Follow-up    6 month    HISTORY OF PRESENT ILLNESS:   UPDATE 01/04/16: Since last visit, doing about the same. No new sxs. Tolerating tecfidera.   UPDATE 07/07/15: No new issues. Tolerating meds. Overall doing well.  UPDATE 01/05/15: Since last visit, overall stable. Still notes some balance difficulty. He is trying to increase his walking back up to 2 miles per day. Tolerating meds. No new issues.  UPDATE 07/14/14 (LL): Patient failed to follow up for a year, was supposed to follow up in 3 months after last visit. He did not want to do PT. He has been well on Tecfidera with only flushing side effect. He presents today to have labs and get refills for his medication. Has fallen 2-3 times in the last year, no injuries. Complaints of bilateral finger numbness and paresthesias, slower cognitive processing time, fatigue. MMSE 28/30, AFT 18.  UPDATE 06/17/13: Patient presents for followup. Since last visit he has fallen 4 times including breaking his left foot, almost 4 months ago. Otherwise patient is stable. He still has issues with fatigue depression anxiety.  UPDATE 01/17/13: patient returns for urgent followup. Over past 3 weeks, patient has had increasing dizziness, balance difficulty, urination control problems. He feels some additional numbness or weakness in his legs. No difficulty with his hands or arms. No vision changes, slurred speech, trouble talking. Wife notes some more mood swings, irritability, depression, anxiety, attention and focus problems. He also has had more difficulty with hearing.  UPDATE 09/30/12: Since last visit, doing well. Started tecfidera on Jul 21, 2012. Initially flushing and GI sxs, but have eased up. No new MS flares or sxs.  UPDATE 06/19/12: S/p right CEA on 06/10/12. No complications. No new stroke/TIA symptoms. WIll proceed with MS treatment.   UPDATE 05/01/12: Since last visit, had MRI brain, c and t spine, confirming chronic MS. On 04/08/12, had sudden onset dizziness, seeing floaters, and left face arm leg numbness x 10 minutes. Didn't tell anyone for 4 days. Had full recovery. Today carotid u/s shows right ICA stenosis (50-69%). Also with fatigue, malaise. Thinking about disability.  PRIOR HPI: 65 year old right-handed male with history of hypertension, hypercholesteremia, coronary artery disease, depression, anxiety, multiple sclerosis, TIA, here for evaluation and management of MS. In 1989, patient developed numbness spots in the back. He was evaluated by Dr. Rosiland Oz ([place]), with MRI and lumbar puncture, and given a diagnosis of MS. We do not have those records to review today. 2 years later he transferred care to Dr. Aggie Hacker [facility]), who treated him with prednisone. Over these years she developed some staggering, balance difficulty, vision problems. In 1995 he was started on Betaseron. Over the next 10-15 years he was on and off of Betaseron, do to fluctuation in liver function testing. Patient last saw Dr. Aggie Hacker in 2011. He saw a different neurologist at [facility] in 2012. Since that time he has not seen a neurologist. His last MRI was around 2000 102,011, which is apparently stable. He weaned himself off of Betaseron in 2011 because he felt that his symptoms weren't fairly mild.  In 2013 he is started to develop increasing fatigue, balance difficulty,  numbness in his hands and feet. Also having trouble with coordination.  REVIEW OF SYSTEMS: Full 14 system review of systems performed and negative except for numbness freq urination depression hearing loss weakness.   ALLERGIES: No Known Allergies  HOME  MEDICATIONS: Outpatient Prescriptions Prior to Visit  Medication Sig Dispense Refill  . buPROPion (WELLBUTRIN XL) 300 MG 24 hr tablet Take 300 mg by mouth daily.    . cholecalciferol (VITAMIN D) 1000 UNITS tablet Take 1,000 Units by mouth daily.    . Choline Fenofibrate (FENOFIBRIC ACID) 135 MG CPDR Take 1 capsule by mouth daily.   0  . citalopram (CELEXA) 20 MG tablet Take 20 mg by mouth daily.  0  . Dimethyl Fumarate (TECFIDERA) 240 MG CPDR Take 1 capsule (240 mg total) by mouth 2 (two) times daily. 60 capsule 6  . dipyridamole-aspirin (AGGRENOX) 200-25 MG 12hr capsule Take 1 capsule by mouth 2 (two) times daily.  0  . LORazepam (ATIVAN) 0.5 MG tablet Take 0.5 mg by mouth 2 (two) times daily. nerves    . metoprolol (LOPRESSOR) 50 MG tablet Take 25 mg by mouth 2 (two) times daily.     . Multiple Vitamin (MULTIVITAMIN WITH MINERALS) TABS tablet Take 1 tablet by mouth daily.    . nitroGLYCERIN (NITROSTAT) 0.4 MG SL tablet Place 1 tablet (0.4 mg total) under the tongue every 5 (five) minutes as needed for chest pain. 25 tablet 3  . Omega-3 Fatty Acids (FISH OIL) 1000 MG CAPS Take 1 capsule by mouth 2 (two) times daily.     Marland Kitchen omeprazole (PRILOSEC) 20 MG capsule Take 20 mg by mouth daily.      . pravastatin (PRAVACHOL) 40 MG tablet Take 1 tablet by mouth daily.     No facility-administered medications prior to visit.    PAST MEDICAL HISTORY: Past Medical History  Diagnosis Date  . MS (multiple sclerosis) ([place])     diagnosed in 1989. He was treated with prednisone followed by betaseron which was gradually tapered because his LFTs went up and it resolved off therapy. Likely he is remained in remission for years  . HLD (hyperlipidemia)   . S/P right inguinal herniorrhaphy   . TIA (transient ischemic attack)   . CAD (coronary artery disease)     had 2 arteries tented four years ago sees Dr. Dannielle Burn  . HTN (hypertension)     Sees Dr. Woody Seller  . Depression   . Anxiety   . GERD (gastroesophageal  reflux disease)   . Headache(784.0)     migraines hx of  . Neuromuscular disorder [facility])     has Multiple Sclerosis, sees Dr. Leta Baptist  . Stroke [facility])     "patient states has had several TIA's, last one 05/27/12"  . Carotid artery occlusion   . Diabetes mellitus without complication ([place])     PAST SURGICAL HISTORY: Past Surgical History  Procedure Laterality Date  . Colonoscopy  March 12, 2006    Had 2 polyps removed; one was an adenoma and the other one was hyperplastic  . Stents    . Varicose removal from scrotum  1982  . Hydrocele excision  2011  . Vasectomy  1989  . Hernia repair  age 11  . Endarterectomy  06/10/2012    Procedure: ENDARTERECTOMY CAROTID;  Surgeon: Rosetta Posner, MD;  Location: [place];  Service: Vascular;  Laterality: Right;  With Dacron Patch Angioplasty  . Carotid endarterectomy    . Colonoscopy N/A 10/09/2012    Procedure:  COLONOSCOPY;  Surgeon: Rogene Houston, MD;  [facility];  Service: Endoscopy;  Laterality: N/A;  1030-rescheduled to 12:00pm per [name]  . Eye surgery Right 10/2015    cataract    FAMILY HISTORY: Family History  Problem Relation Age of Onset  . Diabetes Mother   . Hypertension Mother   . Heart disease Mother     Before age 42  . Hyperlipidemia Mother   . Heart attack Father   . Lung cancer Father   . Cancer Father     Lung  . Heart disease Father     Before age 47  . Hypertension Son     SOCIAL HISTORY:  Social History   Social History  . Marital Status: Married    Spouse Name: Horacio Paller  . Number of Children: 2  . Years of Education: 84yrs   Occupational History  .      [employer]   Social History Main Topics  . Smoking status: Former Smoker -- 2.00 packs/day for 20 years    Types: Cigarettes    Start date: 05/23/1968    Quit date: 08/21/1993  . Smokeless tobacco: Never Used  . Alcohol Use: No  . Drug Use: No  . Sexual Activity: Not on file   Other Topics Concern  . Not on file   Social  History Narrative   Full time. Married. Works at [employer].    Pt lives at home with his spouse.   Caffeine Use: none     PHYSICAL EXAM  Filed Vitals:   01/04/16 1302  BP: 129/83  Pulse: 56  Height: 6' (1.829 m)  Weight: 238 lb 6.4 oz (108.138 kg)    Body mass index is 32.33 kg/(m^2).   Visual Acuity Screening   Right eye Left eye Both eyes  Without correction: 20/40 20/30   With correction:       MMSE - Mini Mental State Exam 07/14/2014  Orientation to time 5  Orientation to Place 5  Registration 3  Attention/ Calculation 5  Recall 1  Language- name 2 objects 2  Language- repeat 1  Language- follow 3 step command 3  Language- read & follow direction 1  Write a sentence 1  Copy design 1  Total score 28    GENERAL EXAM: Patient is in no distress; well developed, nourished and groomed; neck is supple  CARDIOVASCULAR: Regular rate and rhythm, no murmurs, no carotid bruits  NEUROLOGIC: MENTAL STATUS: awake, alert, language fluent, comprehension intact, naming intact, fund of knowledge appropriate CRANIAL NERVE: no papilledema on fundoscopic exam, pupils equal and reactive to light, visual fields full to confrontation, extraocular muscles intact, no nystagmus, facial sensation and strength symmetric, hearing intact, palate elevates symmetrically, uvula midline, shoulder shrug symmetric, tongue midline. MOTOR: normal bulk and tone, full strength in the BUE, BLE SENSORY: normal and symmetric to light touch; EXCEPT DECR IN HANDS AND FEET COORDINATION: finger-nose-finger, fine finger movements normal REFLEXES: deep tendon reflexes present and symmetric GAIT/STATION: narrow based gait; SLIGHT DIFF WITH TANDEM GAIT; romberg is negative    DIAGNOSTIC DATA (LABS, IMAGING, TESTING) - I reviewed patient records, labs, notes, testing and imaging myself where available.  Lab Results  Component Value Date   WBC 7.6 07/03/2015   HGB 16.0 07/03/2015   HCT 46.6  07/03/2015   MCV 97.3 07/03/2015   PLT 183 07/03/2015      Component Value Date/Time   NA 137 07/03/2015 1641   NA 143  01/05/2015 0905   K 4.6 07/03/2015 1641   CL 102 07/03/2015 1641   CO2 26 07/03/2015 1641   GLUCOSE 93 07/03/2015 1641   GLUCOSE 96 01/05/2015 0905   BUN 15 07/03/2015 1641   BUN 18 01/05/2015 0905   CREATININE 1.15 07/03/2015 1641   CALCIUM 9.8 07/03/2015 1641   PROT 8.1 07/03/2015 1641   PROT 7.1 01/05/2015 0905   ALBUMIN 4.8 07/03/2015 1641   ALBUMIN 4.7 01/05/2015 0905   AST 47* 07/03/2015 1641   ALT 58 07/03/2015 1641   ALKPHOS 38 07/03/2015 1641   BILITOT 0.9 07/03/2015 1641   BILITOT 0.7 01/05/2015 0905   GFRNONAA >60 07/03/2015 1641   GFRAA >60 07/03/2015 1641   No results found for: CHOL, HDL, LDLCALC, LDLDIRECT, TRIG, CHOLHDL Lab Results  Component Value Date   HGBA1C 5.4 01/17/2013   Lab Results  Component Value Date   VITAMINB12 302 07/14/2014   Lab Results  Component Value Date   TSH 2.320 01/17/2013   VIT D, 25-HYDROXY  Date Value Ref Range Status  07/07/2015 23.8* 30.0 - 100.0 ng/mL Final    Comment:    Vitamin D deficiency has been defined by the [place] practice guideline as a level of serum 25-OH vitamin D less than 20 ng/mL (1,2). The Endocrine Society went on to further define vitamin D insufficiency as a level between 21 and 29 ng/mL (2). 1. IOM (Institute of Medicine). 2010. Dietary reference    intakes for calcium and D. [place]: The    [employer]. 2. Holick MF, Binkley Dunbar, Bischoff-Ferrari HA, et al.    Evaluation, treatment, and prevention of vitamin D    deficiency: an Endocrine Society clinical practice    guideline. JCEM. 2011 Jul; 96(7):1911-30.    LYMPHOCYTES ABSOLUTE  Date Value Ref Range Status  01/05/2015 0.8 0.7 - 3.1 x10E3/uL Final  07/14/2014 1.0 0.7 - 3.1 x10E3/uL Final  06/17/2013 1.0 0.7 - 3.1 x10E3/uL Final    02/06/13 MRI brain (with  and without) demonstrating: 1. Multiple periventricular and subcortical chronic demyelinating plaques. Chronic plaque noted in the right medulla. 2. No acute plaques. 3. No change from MRI on 04/02/12.  02/06/13 MRI thoracic spine (with and without) demonstrating: 1. Multiple chronic demyelinating plaques at T5, T6, T7 and T9-10. 2. No acute plaques. 3. No change from MRI on 04/02/12.  01/06/15 JCV ab - 1.33 H positive    ASSESSMENT AND PLAN  65 y.o. year old male here with multiple sclerosis and prior TIA.  MS --> Doing well on tecfidera. He reports being steroid intolerant in past (hallucinations, stomach irritation, weight gain).   TIA --> due to hypertension, hypercholesterolemia. Had right brain TIA on 04/08/12, with right ICA stenosis, s/p right CEA 06/10/12.   PLAN: - continue tecfidera for multiple sclerosis - continue stroke prevention with aggrenox, statin, BP control per PCP - repeat labs   Orders Placed This Encounter  Procedures  . CBC with Differential/Platelet  . Comprehensive metabolic panel  . VITAMIN D 25 Hydroxy (Vit-D Deficiency, Fractures)   Return in about 6 months (around 07/06/2016).      Penni Bombard, MD [id number], [id number] PM Certified in Neurology, Neurophysiology and Neuroimaging  [facility] [address] [place], [place] [address] [phone]

## 2016-01-04 NOTE — Patient Instructions (Signed)
Thank you for coming to see [place] at [facility]. I hope we have been able to provide you high quality care today.  You may receive a patient satisfaction survey over the next few weeks. We would appreciate your feedback and comments so that we may continue to improve ourselves and the health of our patients.  - continue tecfidera   ~~~~~~~~~~~~~~~~~~~~~~~~~~~~~~~~~~~~~~~~~~~~~~~~~~~~~~~~~~~~~~~~~  DR. Zahirah Cheslock'S GUIDE TO HAPPY AND HEALTHY LIVING These are some of my general health and wellness recommendations. Some of them may apply to you better than others. Please use common sense as you try these suggestions and feel free to ask me any questions.   ACTIVITY/FITNESS Mental, social, emotional and physical stimulation are very important for brain and body health. Try learning a new activity (arts, music, language, sports, games).  Keep moving your body to the best of your abilities. You can do this at home, inside or outside, the park, community center, gym or anywhere you like. Consider a physical therapist or personal trainer to get started. Consider the app Sworkit. Fitness trackers such as smart-watches, smart-phones or Fitbits can help as well.   NUTRITION Eat more plants: colorful vegetables, nuts, seeds and berries.  Eat less sugar, salt, preservatives and processed foods.  Avoid toxins such as cigarettes and alcohol.  Drink water when you are thirsty. Warm water with a slice of lemon is an excellent morning drink to start the day.  Consider these websites for more information The Nutrition Source (https://www.henry-hernandez.biz/) Precision Nutrition (WindowBlog.ch)   RELAXATION Consider practicing mindfulness meditation or other relaxation techniques such as deep breathing, prayer, yoga, tai chi, massage. See website mindful.org or the apps Headspace or Calm to help get started.   SLEEP Try to get at least  7-8+ hours sleep per day. Regular exercise and reduced caffeine will help you sleep better. Practice good sleep hygeine techniques. See website sleep.org for more information.   PLANNING Prepare estate planning, living will, healthcare POA documents. Sometimes this is best planned with the help of an attorney. Theconversationproject.org and agingwithdignity.org are excellent resources.

## 2016-01-05 LAB — CBC WITH DIFFERENTIAL/PLATELET
BASOS ABS: 0 10*3/uL (ref 0.0–0.2)
Basos: 0 %
EOS (ABSOLUTE): 0.2 10*3/uL (ref 0.0–0.4)
Eos: 4 %
Hematocrit: 43.5 % (ref 37.5–51.0)
Hemoglobin: 15.2 g/dL (ref 12.6–17.7)
IMMATURE GRANS (ABS): 0 10*3/uL (ref 0.0–0.1)
Immature Granulocytes: 1 %
Lymphocytes Absolute: 0.9 10*3/uL (ref 0.7–3.1)
Lymphs: 22 %
MCH: 33.3 pg — AB (ref 26.6–33.0)
MCHC: 34.9 g/dL (ref 31.5–35.7)
MCV: 95 fL (ref 79–97)
Monocytes Absolute: 0.5 10*3/uL (ref 0.1–0.9)
Monocytes: 11 %
NEUTROS ABS: 2.7 10*3/uL (ref 1.4–7.0)
NEUTROS PCT: 62 %
PLATELETS: 194 10*3/uL (ref 150–379)
RBC: 4.57 x10E6/uL (ref 4.14–5.80)
RDW: 13.6 % (ref 12.3–15.4)
WBC: 4.3 10*3/uL (ref 3.4–10.8)

## 2016-01-05 LAB — COMPREHENSIVE METABOLIC PANEL
ALT: 58 IU/L — ABNORMAL HIGH (ref 0–44)
AST: 57 IU/L — ABNORMAL HIGH (ref 0–40)
Albumin/Globulin Ratio: 1.9 (ref 1.2–2.2)
Albumin: 4.7 g/dL (ref 3.6–4.8)
Alkaline Phosphatase: 33 IU/L — ABNORMAL LOW (ref 39–117)
BUN / CREAT RATIO: 13 (ref 10–24)
BUN: 14 mg/dL (ref 8–27)
Bilirubin Total: 0.6 mg/dL (ref 0.0–1.2)
CO2: 21 mmol/L (ref 18–29)
CREATININE: 1.12 mg/dL (ref 0.76–1.27)
Calcium: 9.5 mg/dL (ref 8.6–10.2)
Chloride: 99 mmol/L (ref 96–106)
GFR, EST AFRICAN AMERICAN: 81 mL/min/{1.73_m2} (ref >59)
GFR, EST NON AFRICAN AMERICAN: 70 mL/min/{1.73_m2} (ref >59)
GLUCOSE: 83 mg/dL (ref 65–99)
Globulin, Total: 2.5 g/dL (ref 1.5–4.5)
POTASSIUM: 4.9 mmol/L (ref 3.5–5.2)
SODIUM: 138 mmol/L (ref 134–144)
TOTAL PROTEIN: 7.2 g/dL (ref 6.0–8.5)

## 2016-01-05 LAB — VITAMIN D 25 HYDROXY (VIT D DEFICIENCY, FRACTURES): Vit D, 25-Hydroxy: 31.8 ng/mL (ref 30.0–100.0)

## 2016-01-06 ENCOUNTER — Telehealth: Payer: Self-pay | Admitting: *Deleted

## 2016-01-06 NOTE — Telephone Encounter (Signed)
Spoke with patient and informed him,  Per Dr Leta Baptist his labs are normal  except Liver Functions are slightly up, and slightly more than last year. Advised he needs repeat labs in 2-3 months and follow up with PCP. He stated he would call his PCP to set up an appointment and verbalized understanding, appreciation of call.

## 2016-07-10 ENCOUNTER — Ambulatory Visit: Payer: Medicare Other | Admitting: Diagnostic Neuroimaging

## 2016-07-19 ENCOUNTER — Ambulatory Visit: Payer: Medicare Other | Admitting: Diagnostic Neuroimaging

## 2016-07-20 ENCOUNTER — Encounter: Payer: Self-pay | Admitting: Diagnostic Neuroimaging

## 2016-08-08 ENCOUNTER — Encounter (HOSPITAL_COMMUNITY): Payer: Medicare Other

## 2016-08-08 ENCOUNTER — Ambulatory Visit: Payer: Medicare Other | Admitting: Family

## 2016-08-10 ENCOUNTER — Encounter: Payer: Self-pay | Admitting: Family

## 2016-08-22 ENCOUNTER — Other Ambulatory Visit: Payer: Self-pay

## 2016-08-22 DIAGNOSIS — Z48812 Encounter for surgical aftercare following surgery on the circulatory system: Secondary | ICD-10-CM

## 2016-08-22 DIAGNOSIS — I6523 Occlusion and stenosis of bilateral carotid arteries: Secondary | ICD-10-CM

## 2016-08-23 ENCOUNTER — Encounter: Payer: Self-pay | Admitting: Family

## 2016-08-23 ENCOUNTER — Ambulatory Visit (HOSPITAL_COMMUNITY)
Admission: RE | Admit: 2016-08-23 | Discharge: 2016-08-23 | Disposition: A | Discharge status: Home / Self Care | Payer: Medicare Other | Source: Ambulatory Visit | Attending: Family | Admitting: Family

## 2016-08-23 ENCOUNTER — Ambulatory Visit (INDEPENDENT_AMBULATORY_CARE_PROVIDER_SITE_OTHER): Payer: Medicare Other | Admitting: Family

## 2016-08-23 VITALS — BP 132/83 | HR 68 | Temp 98.2°F | Resp 20 | Ht 72.0 in | Wt 242.0 lb

## 2016-08-23 DIAGNOSIS — Z9889 Other specified postprocedural states: Secondary | ICD-10-CM | POA: Diagnosis not present

## 2016-08-23 DIAGNOSIS — I6521 Occlusion and stenosis of right carotid artery: Secondary | ICD-10-CM

## 2016-08-23 DIAGNOSIS — Z48812 Encounter for surgical aftercare following surgery on the circulatory system: Secondary | ICD-10-CM

## 2016-08-23 DIAGNOSIS — I6523 Occlusion and stenosis of bilateral carotid arteries: Secondary | ICD-10-CM | POA: Diagnosis present

## 2016-08-23 LAB — VAS US CAROTID
LCCADDIAS: -22 cm/s
LEFT ECA DIAS: -19 cm/s
LICADDIAS: -20 cm/s
LICADSYS: -59 cm/s
LICAPSYS: -61 cm/s
Left CCA dist sys: -76 cm/s
Left CCA prox dias: 22 cm/s
Left CCA prox sys: 85 cm/s
Left ICA prox dias: -14 cm/s
RCCAPSYS: 93 cm/s
RIGHT CCA MID DIAS: 16 cm/s
RIGHT ECA DIAS: -17 cm/s
Right CCA prox dias: 21 cm/s
Right cca dist sys: -41 cm/s

## 2016-08-23 NOTE — Progress Notes (Signed)
Chief Complaint: Follow up Extracranial Carotid Artery Stenosis   History of Present Illness  Grant Valdez is a 66 y.o. male patient of Dr. Donnetta Hutching who is s/p right carotid endarterectomy in October of 2013. He returns today for follow up.  He has had progression of MS and has had to go out on disability. He feels his MS is stable at this point.   He had two TIA's several years before the CEA, about 2 weeks apart; both manifested as expressive aphasia, denies hemiparesis, denies monocular loss of vision, denies unilateral facial drooping. He denies any subsequent TIA's or stroke.   He walks a mile daily at a fast pace.   Pt Diabetic: No Pt smoker: former smoker, quit in 1995  Pt meds include: Statin : Yes ASA: Yes Other anticoagulants/antiplatelets: no    Past Medical History:  Diagnosis Date  . Anxiety   . CAD (coronary artery disease)    had 2 arteries tented four years ago sees Dr. Dannielle Burn  . Carotid artery occlusion   . Depression   . Diabetes mellitus without complication ([place])   . GERD (gastroesophageal reflux disease)   . Headache(784.0)    migraines hx of  . HLD (hyperlipidemia)   . HTN (hypertension)    Sees Dr. Woody Seller  . MS (multiple sclerosis) ([place])    diagnosed in 1989. He was treated with prednisone followed by betaseron which was gradually tapered because his LFTs went up and it resolved off therapy. Likely he is remained in remission for years  . Neuromuscular disorder [facility])    has Multiple Sclerosis, sees Dr. Leta Baptist  . S/P right inguinal herniorrhaphy   . Stroke [facility])    "patient states has had several TIA's, last one 05/27/12"  . TIA (transient ischemic attack)     Social History Social History  Substance Use Topics  . Smoking status: Former Smoker    Packs/day: 2.00    Years: 20.00    Types: Cigarettes    Start date: 05/23/1968    Quit date: 08/21/1993  . Smokeless tobacco: Never Used  . Alcohol use No    Family History Family  History  Problem Relation Age of Onset  . Diabetes Mother   . Hypertension Mother   . Heart disease Mother     Before age 50  . Hyperlipidemia Mother   . Heart attack Father   . Lung cancer Father   . Cancer Father     Lung  . Heart disease Father     Before age 67  . Hypertension Son     Surgical History Past Surgical History:  Procedure Laterality Date  . CAROTID ENDARTERECTOMY    . COLONOSCOPY  March 12, 2006   Had 2 polyps removed; one was an adenoma and the other one was hyperplastic  . COLONOSCOPY N/A 10/09/2012   Procedure: COLONOSCOPY;  Surgeon: Rogene Houston, MD;  [facility];  Service: Endoscopy;  Laterality: N/A;  1030-rescheduled to 12:00pm per [name]  . ENDARTERECTOMY  06/10/2012   Procedure: ENDARTERECTOMY CAROTID;  Surgeon: Rosetta Posner, MD;  Location: [place];  Service: Vascular;  Laterality: Right;  With Dacron Patch Angioplasty  . EYE SURGERY Right 10/2015   cataract  . HERNIA REPAIR  age 75  . HYDROCELE EXCISION  2011  . stents    . varicose removal from scrotum  1982  . VASECTOMY  1989    No Known Allergies  Current Outpatient Prescriptions  Medication Sig Dispense  Refill  . aspirin 81 MG tablet Take 81 mg by mouth daily.    Marland Kitchen buPROPion (WELLBUTRIN XL) 300 MG 24 hr tablet Take 200 mg by mouth daily.     . cholecalciferol (VITAMIN D) 1000 UNITS tablet Take 1,000 Units by mouth daily.    . Choline Fenofibrate (FENOFIBRIC ACID) 135 MG CPDR Take 1 capsule by mouth daily.   0  . citalopram (CELEXA) 20 MG tablet Take 20 mg by mouth daily.  0  . Dimethyl Fumarate (TECFIDERA) 240 MG CPDR Take 1 capsule (240 mg total) by mouth 2 (two) times daily. 180 capsule 4  . DIPYRIDAMOLE PO Take 100 mg by mouth 2 (two) times daily.    Marland Kitchen LORazepam (ATIVAN) 0.5 MG tablet Take 0.5 mg by mouth 2 (two) times daily. nerves    . metoprolol (LOPRESSOR) 50 MG tablet Take 25 mg by mouth 2 (two) times daily.     . Multiple Vitamin (MULTIVITAMIN WITH MINERALS) TABS tablet  Take 1 tablet by mouth daily.    . nitroGLYCERIN (NITROSTAT) 0.4 MG SL tablet Place 1 tablet (0.4 mg total) under the tongue every 5 (five) minutes as needed for chest pain. 25 tablet 3  . Omega-3 Fatty Acids (FISH OIL) 1000 MG CAPS Take 1 capsule by mouth 2 (two) times daily.     Marland Kitchen omeprazole (PRILOSEC) 20 MG capsule Take 20 mg by mouth daily.      . pravastatin (PRAVACHOL) 40 MG tablet Take 1 tablet by mouth daily.    Marland Kitchen dipyridamole-aspirin (AGGRENOX) 200-25 MG 12hr capsule Take 1 capsule by mouth 2 (two) times daily.  0   No current facility-administered medications for this visit.     Review of Systems : See HPI for pertinent positives and negatives.  Physical Examination  Vitals:   08/23/16 1502 08/23/16 1504  BP: 128/81 132/83  Pulse: 68   Resp: 20   Temp: 98.2 F (36.8 C)   TempSrc: Oral   SpO2: 95%   Weight: 242 lb (109.8 kg)   Height: 6' (1.829 m)    Body mass index is 32.82 kg/m.  General: WDWN male in NAD GAIT: normal Eyes: PERRLA Pulmonary: Respirations are non-labored, CTAB, no rales,  rhonchi, or wheezing.  Cardiac: regular rhythm,no detected murmur.  VASCULAR EXAM Carotid Bruits Right Left   Negative Negative   Aorta is not palpable. Radial pulses are 2+ palpable and equal.      LE Pulses Right Left   POPLITEAL not palpable  not palpable   POSTERIOR TIBIAL  not palpable   not palpable    DORSALIS PEDIS  ANTERIOR TIBIAL palpable  palpable     Gastrointestinal: soft, nontender, BS WNL, no r/g,no palpated masses other than sclerotic areas that were beta seron injections sites.   Musculoskeletal: No muscle atrophy/wasting. M/S 5/5 throughout, Extremities without ischemic changes.  Neurologic: A&O X 3; Appropriate Affect,  Speech is normal CN 2-12 intact, Pain and  light touch intact in extremities, Motor exam as listed above.   Assessment: Grant Valdez is a 66 y.o. male who is s/p right carotid endarterectomy in October of 2013. He had two TIA's several years before the CEA, about 2 weeks apart; both manifested as expressive aphasia. He denies any subsequent TIA's or stroke.   Fortunately he does not have DM and he quit smoking in the 1990's.   DATA  Today's carotid duplex suggests a patent right CEA site with no restenosis and <40% left ICA stenosis. Bilateral vertebral artery  flow is antegrade.  Bilateral subclavian artery waveforms are normal.  No significant change compared to the last exam on 08-02-15.   Plan: Follow-up in 18 months with Carotid Duplex scan.   I discussed in depth with the patient the nature of atherosclerosis, and emphasized the importance of maximal medical management including strict control of blood pressure, blood glucose, and lipid levels, obtaining regular exercise, and continued cessation of smoking.  The patient is aware that without maximal medical management the underlying atherosclerotic disease process will progress, limiting the benefit of any interventions. The patient was given information about stroke prevention and what symptoms should prompt the patient to seek immediate medical care. Thank you for allowing [place] to participate in this patient's care.  Clemon Chambers, RN, MSN, FNP-C [facility] Office: [phone]  Clinic Physician: Dicks[place]  08/23/16 3:10 PM

## 2016-08-23 NOTE — Patient Instructions (Signed)
Stroke Prevention Some medical conditions and behaviors are associated with an increased chance of having a stroke. You may prevent a stroke by making healthy choices and managing medical conditions. How can I reduce my risk of having a stroke?  Stay physically active. Get at least 30 minutes of activity on most or all days.  Do not smoke. It may also be helpful to avoid exposure to secondhand smoke.  Limit alcohol use. Moderate alcohol use is considered to be:  No more than 2 drinks per day for men.  No more than 1 drink per day for nonpregnant women.  Eat healthy foods. This involves:  Eating 5 or more servings of fruits and vegetables a day.  Making dietary changes that address high blood pressure (hypertension), high cholesterol, diabetes, or obesity.  Manage your cholesterol levels.  Making food choices that are high in fiber and low in saturated fat, trans fat, and cholesterol may control cholesterol levels.  Take any prescribed medicines to control cholesterol as directed by your health care provider.  Manage your diabetes.  Controlling your carbohydrate and sugar intake is recommended to manage diabetes.  Take any prescribed medicines to control diabetes as directed by your health care provider.  Control your hypertension.  Making food choices that are low in salt (sodium), saturated fat, trans fat, and cholesterol is recommended to manage hypertension.  Ask your health care provider if you need treatment to lower your blood pressure. Take any prescribed medicines to control hypertension as directed by your health care provider.  If you are 18-39 years of age, have your blood pressure checked every 3-5 years. If you are 40 years of age or older, have your blood pressure checked every year.  Maintain a healthy weight.  Reducing calorie intake and making food choices that are low in sodium, saturated fat, trans fat, and cholesterol are recommended to manage  weight.  Stop drug abuse.  Avoid taking birth control pills.  Talk to your health care provider about the risks of taking birth control pills if you are over 35 years old, smoke, get migraines, or have ever had a blood clot.  Get evaluated for sleep disorders (sleep apnea).  Talk to your health care provider about getting a sleep evaluation if you snore a lot or have excessive sleepiness.  Take medicines only as directed by your health care provider.  For some people, aspirin or blood thinners (anticoagulants) are helpful in reducing the risk of forming abnormal blood clots that can lead to stroke. If you have the irregular heart rhythm of atrial fibrillation, you should be on a blood thinner unless there is a good reason you cannot take them.  Understand all your medicine instructions.  Make sure that other conditions (such as anemia or atherosclerosis) are addressed. Get help right away if:  You have sudden weakness or numbness of the face, arm, or leg, especially on one side of the body.  Your face or eyelid droops to one side.  You have sudden confusion.  You have trouble speaking (aphasia) or understanding.  You have sudden trouble seeing in one or both eyes.  You have sudden trouble walking.  You have dizziness.  You have a loss of balance or coordination.  You have a sudden, severe headache with no known cause.  You have new chest pain or an irregular heartbeat. Any of these symptoms may represent a serious problem that is an emergency. Do not wait to see if the symptoms will go away.   Get medical help at once. Call your local emergency services (911 in [place] Do not drive yourself to the hospital. This information is not intended to replace advice given to you by your health care provider. Make sure you discuss any questions you have with your health care provider. Document Released: 09/14/2004 Document Revised: 01/13/2016 Document Reviewed: 02/07/2013 Elsevier  Interactive Patient Education  2017 Elsevier Inc.  

## 2016-08-24 NOTE — Addendum Note (Signed)
Addended by: Lianne Cure A on: 08/24/2016 09:57 AM   Modules accepted: Orders

## 2016-09-12 ENCOUNTER — Telehealth: Payer: Self-pay | Admitting: Diagnostic Neuroimaging

## 2016-09-12 MED ORDER — DIMETHYL FUMARATE 240 MG PO CPDR: 1.0000 | DELAYED_RELEASE_CAPSULE | Freq: Two times a day (BID) | ORAL | 0 refills | Status: DC

## 2016-09-12 NOTE — Addendum Note (Signed)
Addended by: Florian Buff C on: 09/12/2016 12:11 PM   Modules accepted: Orders

## 2016-09-12 NOTE — Telephone Encounter (Signed)
Patient requesting a new prescription for Dimethyl Fumarate ([place]) 240 MG CPDR faxed to [phone] ([facility]) new pharmacy. Patient only has enough medication thru next Monday

## 2016-10-03 ENCOUNTER — Telehealth: Payer: Self-pay | Admitting: Diagnostic Neuroimaging

## 2016-10-03 NOTE — Telephone Encounter (Signed)
Called wife for further details. She stated her husband describes pain as a "lightening bolt going through". She stated the pain comes and goes very quickly.  She stated he has no other complaints, symptoms. She stated he had dopplers of carotids 08/23/16, and they were clear.  She stated she wondered if may be trigeminal neuralgia or related to his MS. She stated he has not had any recent dental work or problems with his teeth. Advised her if his symptoms worsen, return and do not resolve she needs to call PCP, cardiologist and /or take him to Urgent Care to be evaluated today. Otherwise patient has follow up at 8 am tomorrow morning with Dr Leta Baptist. She verbalized understanding,  appreciation of call back.

## 2016-10-03 NOTE — Telephone Encounter (Signed)
Patient's wife reports new left sided jaw pain shooting thru the left nostril thru the eye thru the left side of the head starting yesterday. This happened about 5 times yesterday and about 3 times today.  An appointment was made for 2/14 @ 8am.  Advised that the nurse would call if there was any other questions.

## 2016-10-04 ENCOUNTER — Encounter: Payer: Self-pay | Admitting: Diagnostic Neuroimaging

## 2016-10-04 ENCOUNTER — Ambulatory Visit (INDEPENDENT_AMBULATORY_CARE_PROVIDER_SITE_OTHER): Payer: Medicare Other | Admitting: Diagnostic Neuroimaging

## 2016-10-04 VITALS — BP 117/73 | HR 64 | Wt 237.6 lb

## 2016-10-04 DIAGNOSIS — R269 Unspecified abnormalities of gait and mobility: Secondary | ICD-10-CM

## 2016-10-04 DIAGNOSIS — G500 Trigeminal neuralgia: Secondary | ICD-10-CM | POA: Diagnosis not present

## 2016-10-04 DIAGNOSIS — G35 Multiple sclerosis: Secondary | ICD-10-CM

## 2016-10-04 DIAGNOSIS — G35D Multiple sclerosis, unspecified: Secondary | ICD-10-CM

## 2016-10-04 DIAGNOSIS — R202 Paresthesia of skin: Secondary | ICD-10-CM

## 2016-10-04 MED ORDER — CARBAMAZEPINE ER 100 MG PO TB12: 100.0000 mg | ORAL_TABLET | Freq: Two times a day (BID) | ORAL | 3 refills | Status: DC

## 2016-10-04 NOTE — Patient Instructions (Signed)
-   check MRI brain  - start carbamazepine 100mg  twice a day for facial pain  - follow up with dentist

## 2016-10-04 NOTE — Progress Notes (Signed)
[facility]  PATIENT: Grant Valdez DOB: 20-Feb-1951  REFERRING CLINICIAN:  HISTORY FROM: patient  REASON FOR VISIT: follow up   HISTORICAL  CHIEF COMPLAINT:  Chief Complaint  Patient presents with  . Multiple Sclerosis    rm 7, wife- [name]  . Pain    "New onset of severe lightening pain in left side of face; occuring multiple times a day"  . Follow-up    scheduled due to new symptoms    HISTORY OF PRESENT ILLNESS:   UPDATE 10/04/16: Since last visit, was doing well until yesterday; now having new onset of left facial pain, intermittent, shooting electrical pain, from left lower jaw up to left eye. Triggered by brushing teeth, licking lip or touching. Multiple attacks per day. Tolerating tecfidera.   UPDATE 01/04/16: Since last visit, doing about the same. No new sxs. Tolerating tecfidera.   UPDATE 07/07/15: No new issues. Tolerating meds. Overall doing well.  UPDATE 01/05/15: Since last visit, overall stable. Still notes some balance difficulty. He is trying to increase his walking back up to 2 miles per day. Tolerating meds. No new issues.  UPDATE 07/14/14 (LL): Patient failed to follow up for a year, was supposed to follow up in 3 months after last visit. He did not want to do PT. He has been well on Tecfidera with only flushing side effect. He presents today to have labs and get refills for his medication. Has fallen 2-3 times in the last year, no injuries. Complaints of bilateral finger numbness and paresthesias, slower cognitive processing time, fatigue. MMSE 28/30, AFT 18.  UPDATE 06/17/13: Patient presents for followup. Since last visit he has fallen 4 times including breaking his left foot, almost 4 months ago. Otherwise patient is stable. He still has issues with fatigue depression anxiety.  UPDATE 01/17/13: patient returns for urgent followup. Over past 3 weeks, patient has had increasing dizziness, balance difficulty, urination control problems. He  feels some additional numbness or weakness in his legs. No difficulty with his hands or arms. No vision changes, slurred speech, trouble talking. Wife notes some more mood swings, irritability, depression, anxiety, attention and focus problems. He also has had more difficulty with hearing.  UPDATE 09/30/12: Since last visit, doing well. Started tecfidera on Jul 21, 2012. Initially flushing and GI sxs, but have eased up. No new MS flares or sxs.  UPDATE 06/19/12: S/p right CEA on 06/10/12. No complications. No new stroke/TIA symptoms. Will proceed with MS treatment.   UPDATE 05/01/12: Since last visit, had MRI brain, c and t spine, confirming chronic MS. On 04/08/12, had sudden onset dizziness, seeing floaters, and left face arm leg numbness x 10 minutes. Didn't tell anyone for 4 days. Had full recovery. Today carotid u/s shows right ICA stenosis (50-69%). Also with fatigue, malaise. Thinking about disability.  PRIOR HPI: 66 year old right-handed male with history of hypertension, hypercholesteremia, coronary artery disease, depression, anxiety, multiple sclerosis, TIA, here for evaluation and management of MS. In 1989, patient developed numbness spots in the back. He was evaluated by Dr. Rosiland Oz ([place]), with MRI and lumbar puncture, and given a diagnosis of MS. We do not have those records to review today. 2 years later he transferred care to Dr. Aggie Hacker [facility]), who treated him with prednisone. Over these years she developed some staggering, balance difficulty, vision problems. In 1995 he was started on Betaseron. Over the next 10-15 years he was on and off of Betaseron, do to fluctuation in liver function testing. Patient last  saw Dr. Aggie Hacker in 2011. He saw a different neurologist at [facility] in 2012. Since that time he has not seen a neurologist. His last MRI was around 2000 102,011, which is apparently stable. He weaned himself off of Betaseron in 2011 because he felt that his symptoms  weren't fairly mild.  In 2013 he is started to develop increasing fatigue, balance difficulty, numbness in his hands and feet. Also having trouble with coordination.   REVIEW OF SYSTEMS: Full 14 system review of systems performed and negative except: walking diff fatigue hearing loss eye pain.     ALLERGIES: No Known Allergies  HOME MEDICATIONS: Outpatient Medications Prior to Visit  Medication Sig Dispense Refill  . buPROPion (WELLBUTRIN XL) 300 MG 24 hr tablet Take 200 mg by mouth daily.     . cholecalciferol (VITAMIN D) 1000 UNITS tablet Take 1,000 Units by mouth daily.    . Choline Fenofibrate (FENOFIBRIC ACID) 135 MG CPDR Take 1 capsule by mouth daily.   0  . citalopram (CELEXA) 20 MG tablet Take 20 mg by mouth daily.  0  . Dimethyl Fumarate (TECFIDERA) 240 MG CPDR Take 1 capsule (240 mg total) by mouth 2 (two) times daily. 180 capsule 0  . dipyridamole-aspirin (AGGRENOX) 200-25 MG 12hr capsule Take 1 capsule by mouth 2 (two) times daily.  0  . LORazepam (ATIVAN) 0.5 MG tablet Take 0.5 mg by mouth 2 (two) times daily. nerves    . metoprolol (LOPRESSOR) 50 MG tablet Take 25 mg by mouth 2 (two) times daily.     . Multiple Vitamin (MULTIVITAMIN WITH MINERALS) TABS tablet Take 1 tablet by mouth daily.    . nitroGLYCERIN (NITROSTAT) 0.4 MG SL tablet Place 1 tablet (0.4 mg total) under the tongue every 5 (five) minutes as needed for chest pain. 25 tablet 3  . Omega-3 Fatty Acids (FISH OIL) 1000 MG CAPS Take 1 capsule by mouth 2 (two) times daily.     Marland Kitchen omeprazole (PRILOSEC) 20 MG capsule Take 20 mg by mouth daily.      . pravastatin (PRAVACHOL) 40 MG tablet Take 1 tablet by mouth daily.    Marland Kitchen DIPYRIDAMOLE PO Take 100 mg by mouth 2 (two) times daily.    Marland Kitchen aspirin 81 MG tablet Take 81 mg by mouth daily.     No facility-administered medications prior to visit.     PAST MEDICAL HISTORY: Past Medical History:  Diagnosis Date  . Anxiety   . CAD (coronary artery disease)    had 2 arteries  tented four years ago sees Dr. Dannielle Burn  . Carotid artery occlusion   . Depression   . Diabetes mellitus without complication ([place])   . GERD (gastroesophageal reflux disease)   . Headache(784.0)    migraines hx of  . HLD (hyperlipidemia)   . HTN (hypertension)    Sees Dr. Woody Seller  . MS (multiple sclerosis) ([place])    diagnosed in 1989. He was treated with prednisone followed by betaseron which was gradually tapered because his LFTs went up and it resolved off therapy. Likely he is remained in remission for years  . Neuromuscular disorder [facility])    has Multiple Sclerosis, sees Dr. Leta Baptist  . S/P right inguinal herniorrhaphy   . Stroke [facility])    "patient states has had several TIA's, last one 05/27/12"  . TIA (transient ischemic attack)     PAST SURGICAL HISTORY: Past Surgical History:  Procedure Laterality Date  . CAROTID ENDARTERECTOMY    . COLONOSCOPY  March 12, 2006   Had 2 polyps removed; one was an adenoma and the other one was hyperplastic  . COLONOSCOPY N/A 10/09/2012   Procedure: COLONOSCOPY;  Surgeon: Rogene Houston, MD;  [facility];  Service: Endoscopy;  Laterality: N/A;  1030-rescheduled to 12:00pm per [name]  . ENDARTERECTOMY  06/10/2012   Procedure: ENDARTERECTOMY CAROTID;  Surgeon: Rosetta Posner, MD;  Location: [facility];  Service: Vascular;  Laterality: Right;  With Dacron Patch Angioplasty  . EYE SURGERY Right 10/2015   cataract  . HERNIA REPAIR  age 65  . HYDROCELE EXCISION  2011  . stents    . varicose removal from scrotum  1982  . VASECTOMY  1989    FAMILY HISTORY: Family History  Problem Relation Age of Onset  . Diabetes Mother   . Hypertension Mother   . Heart disease Mother     Before age 95  . Hyperlipidemia Mother   . Heart attack Father   . Lung cancer Father   . Cancer Father     Lung  . Heart disease Father     Before age 59  . Hypertension Son     SOCIAL HISTORY:  Social History   Social History  . Marital status: Married    Spouse  name: Hadriel Koller  . Number of children: 2  . Years of education: 20yrs   Occupational History  .  [employer]   Social History Main Topics  . Smoking status: Former Smoker    Packs/day: 2.00    Years: 20.00    Types: Cigarettes    Start date: 05/23/1968    Quit date: 08/21/1993  . Smokeless tobacco: Never Used  . Alcohol use No  . Drug use: No  . Sexual activity: Not on file   Other Topics Concern  . Not on file   Social History Narrative   Full time. Married. Works at [employer].    Pt lives at home with his spouse.   Caffeine Use: none     PHYSICAL EXAM  Vitals:   10/04/16 0748  BP: 117/73  Pulse: 64  Weight: 237 lb 9.6 oz (107.8 kg)    Body mass index is 32.22 kg/m.  No exam data present  MMSE - Mini Mental State Exam 07/14/2014  Orientation to time 5  Orientation to Place 5  Registration 3  Attention/ Calculation 5  Recall 1  Language- name 2 objects 2  Language- repeat 1  Language- follow 3 step command 3  Language- read & follow direction 1  Write a sentence 1  Copy design 1  Total score 28    GENERAL EXAM: Patient is in no distress; well developed, nourished and groomed; neck is supple  CARDIOVASCULAR: Regular rate and rhythm, no murmurs, no carotid bruits  NEUROLOGIC: MENTAL STATUS: awake, alert, language fluent, comprehension intact, naming intact, fund of knowledge appropriate CRANIAL NERVE: no papilledema on fundoscopic exam, pupils equal and reactive to light, visual fields full to confrontation, extraocular muscles intact, MILD END GAZE NYSTAGMUS, facial sensation and strength symmetric, hearing intact, palate elevates symmetrically, uvula midline, shoulder shrug symmetric, tongue midline. MOTOR: normal bulk and tone, full strength in the BUE, BLE SENSORY: normal and symmetric to light touch; EXCEPT DECR VIB IN HANDS AND FEET COORDINATION: finger-nose-finger, fine finger movements normal REFLEXES: deep  tendon reflexes present and symmetric GAIT/STATION: narrow based gait; SLIGHT DIFF WITH TANDEM GAIT; romberg is negative  DIAGNOSTIC DATA (LABS, IMAGING, TESTING) - I reviewed patient records, labs, notes, testing and imaging myself where available.  Lab Results  Component Value Date   WBC 4.3 01/04/2016   HGB 16.0 07/03/2015   HCT 43.5 01/04/2016   MCV 95 01/04/2016   PLT 194 01/04/2016      Component Value Date/Time   NA 138 01/04/2016 1405   K 4.9 01/04/2016 1405   CL 99 01/04/2016 1405   CO2 21 01/04/2016 1405   GLUCOSE 83 01/04/2016 1405   GLUCOSE 93 07/03/2015 1641   BUN 14 01/04/2016 1405   CREATININE 1.12 01/04/2016 1405   CALCIUM 9.5 01/04/2016 1405   PROT 7.2 01/04/2016 1405   ALBUMIN 4.7 01/04/2016 1405   AST 57 (H) 01/04/2016 1405   ALT 58 (H) 01/04/2016 1405   ALKPHOS 33 (L) 01/04/2016 1405   BILITOT 0.6 01/04/2016 1405   GFRNONAA 70 01/04/2016 1405   GFRAA 81 01/04/2016 1405   No results found for: CHOL, HDL, LDLCALC, LDLDIRECT, TRIG, CHOLHDL Lab Results  Component Value Date   HGBA1C 5.4 01/17/2013   Lab Results  Component Value Date   VITAMINB12 302 07/14/2014   Lab Results  Component Value Date   TSH 2.320 01/17/2013   Vit D, 25-Hydroxy  Date Value Ref Range Status  01/04/2016 31.8 30.0 - 100.0 ng/mL Final    Comment:    Vitamin D deficiency has been defined by the Institute of Medicine and an Endocrine Society practice guideline as a level of serum 25-OH vitamin D less than 20 ng/mL (1,2). The Endocrine Society went on to further define vitamin D insufficiency as a level between 21 and 29 ng/mL (2). 1. IOM (Institute of Medicine). 2010. Dietary reference    intakes for calcium and D. [place]: The    [employer]. 2. Holick MF, Binkley [place], Bischoff-Ferrari HA, et al.    Evaluation, treatment, and prevention of vitamin D    deficiency: an Endocrine Society clinical practice    guideline. JCEM. 2011 Jul;  96(7):1911-30.    Lymphocytes Absolute  Date Value Ref Range Status  01/04/2016 0.9 0.7 - 3.1 x10E3/uL Final  01/05/2015 0.8 0.7 - 3.1 x10E3/uL Final  07/14/2014 1.0 0.7 - 3.1 x10E3/uL Final    02/06/13 MRI brain (with and without) demonstrating: 1. Multiple periventricular and subcortical chronic demyelinating plaques. Chronic plaque noted in the right medulla. 2. No acute plaques. 3. No change from MRI on 04/02/12.  02/06/13 MRI thoracic spine (with and without) demonstrating: 1. Multiple chronic demyelinating plaques at T5, T6, T7 and T9-10. 2. No acute plaques. 3. No change from MRI on 04/02/12.  01/06/15 JCV ab - 1.33 H positive    ASSESSMENT AND PLAN  66 y.o. year old male here with multiple sclerosis and prior TIA.  MS --> Doing well on tecfidera. He reports being steroid intolerant in past (hallucinations, stomach irritation, weight gain).   TIA --> due to hypertension, hypercholesterolemia. Had right brain TIA on 04/08/12, with right ICA stenosis, s/p right CEA 06/10/12.  DX:  Trigeminal neuralgia of left side of face  Multiple sclerosis (HCC)  Gait difficulty  Paresthesia     PLAN:  LEFT FACIAL PAIN / TRIGEMINAL NEURALGIA - check MRI brain (new onset of left facial pain) - carbamazepine 100mg  BID for left trigeminal neuralgia - follow up with dentist re: left facial pain  MULTIPLE SCLEROSIS DISEASE MODIFYING THERAPY - continue tecfidera for multiple sclerosis  STROKE PREVENTION - continue stroke prevention with aggrenox, statin,  BP control per PCP  Meds ordered this encounter  Medications  . carbamazepine (TEGRETOL XR) 100 MG 12 hr tablet    Sig: Take 1 tablet (100 mg total) by mouth 2 (two) times daily.    Dispense:  60 tablet    Refill:  3   Orders Placed This Encounter  Procedures  . MR BRAIN W WO CONTRAST   Return in about 3 months (around 01/01/2017).      Penni Bombard, MD [id number], [id number] AM Certified in Neurology,  Neurophysiology and Neuroimaging  [facility] [address], [place], [place] [address] [phone]

## 2016-10-09 ENCOUNTER — Ambulatory Visit (HOSPITAL_COMMUNITY)
Admission: RE | Admit: 2016-10-09 | Discharge: 2016-10-09 | Disposition: A | Discharge status: Home / Self Care | Payer: Medicare Other | Source: Ambulatory Visit | Attending: Diagnostic Neuroimaging | Admitting: Diagnostic Neuroimaging

## 2016-10-09 ENCOUNTER — Telehealth: Payer: Self-pay | Admitting: *Deleted

## 2016-10-09 DIAGNOSIS — R269 Unspecified abnormalities of gait and mobility: Secondary | ICD-10-CM | POA: Diagnosis not present

## 2016-10-09 DIAGNOSIS — R202 Paresthesia of skin: Secondary | ICD-10-CM | POA: Diagnosis not present

## 2016-10-09 DIAGNOSIS — G500 Trigeminal neuralgia: Secondary | ICD-10-CM

## 2016-10-09 DIAGNOSIS — G35 Multiple sclerosis: Secondary | ICD-10-CM | POA: Diagnosis not present

## 2016-10-09 LAB — POCT I-STAT CREATININE: Creatinine, Ser: 1.1 mg/dL (ref 0.61–1.24)

## 2016-10-09 MED ORDER — GADOBENATE DIMEGLUMINE 529 MG/ML IV SOLN
20.0000 mL | Freq: Once | INTRAVENOUS | Status: AC | PRN
  Administered 2016-10-09: 20 mL via INTRAVENOUS

## 2016-10-09 NOTE — Telephone Encounter (Signed)
Received call from patient's wife stating that patient has decided he would like a Prednisone Rx for his facial pain. She stated he had MRI today, has not called dentist to schedule appointment. Advised her Dr Leta Baptist is out of office until tomorrow. She will call her pharmacy after lunch tomorrow to check on status and call back if Rx not received. She verbalized appreciation, understanding.

## 2016-10-10 NOTE — Telephone Encounter (Signed)
I called patient. MRI is stable. Pain is stable. On CBZ 100mg  BID. Advised to increase CBZ up to 200mg  BID. Will hold off on prednisone as this may be MS symptom rather than MS flare up. Patient understands and agrees with plan. -VRP

## 2016-10-12 ENCOUNTER — Other Ambulatory Visit (HOSPITAL_COMMUNITY): Payer: Medicare Other

## 2016-10-16 MED ORDER — CARBAMAZEPINE ER 100 MG PO TB12: 100.0000 mg | ORAL_TABLET | Freq: Two times a day (BID) | ORAL | 12 refills | Status: DC

## 2016-10-16 MED ORDER — PREDNISONE 10 MG PO TABS: ORAL_TABLET | ORAL | 0 refills | Status: DC

## 2016-10-16 NOTE — Telephone Encounter (Signed)
Pt's wife called says facial pain was worse yesterday and is starting to hurt today. Pt is wanting to try steroid. Please call

## 2016-10-16 NOTE — Telephone Encounter (Signed)
Spoke with patient and advised him Dr Leta Baptist will send in Rx for Prednisone. Confirmed pharmacy. Confirmed with Dr Leta Baptist pt is to continue with increased dose of Carbamazepine.  Patient requested new Rx for Carbamazepine due to increased dose.  Will route to Dr Leta Baptist.

## 2016-10-16 NOTE — Addendum Note (Signed)
Addended byAndrey Spearman on: 10/16/2016 01:22 PM   Modules accepted: Orders

## 2016-11-16 ENCOUNTER — Telehealth: Payer: Self-pay | Admitting: *Deleted

## 2016-11-16 NOTE — Telephone Encounter (Signed)
LVM requesting call back to reschedule follow up. Dr Leta Baptist will be out of office that day.

## 2016-11-20 ENCOUNTER — Ambulatory Visit: Payer: Medicare Other | Admitting: Diagnostic Neuroimaging

## 2016-11-20 NOTE — Telephone Encounter (Signed)
Spoke with patient re: need to reschedule follow up. He prefers to see Dr Leta Baptist. Rescheduled with Dr Leta Baptist the following week.

## 2016-12-05 ENCOUNTER — Other Ambulatory Visit: Payer: Self-pay | Admitting: *Deleted

## 2016-12-05 MED ORDER — DIMETHYL FUMARATE 240 MG PO CPDR: 1.0000 | DELAYED_RELEASE_CAPSULE | Freq: Two times a day (BID) | ORAL | 3 refills | Status: DC

## 2017-01-02 ENCOUNTER — Ambulatory Visit: Payer: Medicare Other | Admitting: Diagnostic Neuroimaging

## 2017-01-09 ENCOUNTER — Ambulatory Visit (INDEPENDENT_AMBULATORY_CARE_PROVIDER_SITE_OTHER): Payer: Medicare Other | Admitting: Diagnostic Neuroimaging

## 2017-01-09 ENCOUNTER — Encounter: Payer: Self-pay | Admitting: Diagnostic Neuroimaging

## 2017-01-09 VITALS — BP 124/74 | HR 69 | Wt 238.0 lb

## 2017-01-09 DIAGNOSIS — G500 Trigeminal neuralgia: Secondary | ICD-10-CM | POA: Diagnosis not present

## 2017-01-09 DIAGNOSIS — G35 Multiple sclerosis: Secondary | ICD-10-CM

## 2017-01-09 DIAGNOSIS — R269 Unspecified abnormalities of gait and mobility: Secondary | ICD-10-CM

## 2017-01-09 NOTE — Progress Notes (Signed)
[facility]  PATIENT: Grant Valdez DOB: August 23, 1950  REFERRING CLINICIAN:  HISTORY FROM: patient  REASON FOR VISIT: follow up   HISTORICAL  CHIEF COMPLAINT:  Chief Complaint  Patient presents with  . Trigeminal neuralgia, left side    rm 7, wife- [name], "all symptoms gone, discuss stopping Tegretol"  . Multiple Sclerosis    "hands may be a little weaker, per wife"  . Follow-up    3 month    HISTORY OF PRESENT ILLNESS:   UPDATE 01/09/17: Since last visit, left facial pain improved. Tolerating CBZ. Now on CBZ 100mg  BID. No new MS symptoms.  UPDATE 10/04/16: Since last visit, was doing well until yesterday; now having new onset of left facial pain, intermittent, shooting electrical pain, from left lower jaw up to left eye. Triggered by brushing teeth, licking lip or touching. Multiple attacks per day. Tolerating tecfidera.   UPDATE 01/04/16: Since last visit, doing about the same. No new sxs. Tolerating tecfidera.   UPDATE 07/07/15: No new issues. Tolerating meds. Overall doing well.  UPDATE 01/05/15: Since last visit, overall stable. Still notes some balance difficulty. He is trying to increase his walking back up to 2 miles per day. Tolerating meds. No new issues.  UPDATE 07/14/14 (LL): Patient failed to follow up for a year, was supposed to follow up in 3 months after last visit. He did not want to do PT. He has been well on Tecfidera with only flushing side effect. He presents today to have labs and get refills for his medication. Has fallen 2-3 times in the last year, no injuries. Complaints of bilateral finger numbness and paresthesias, slower cognitive processing time, fatigue. MMSE 28/30, AFT 18.  UPDATE 06/17/13: Patient presents for followup. Since last visit he has fallen 4 times including breaking his left foot, almost 4 months ago. Otherwise patient is stable. He still has issues with fatigue depression anxiety.  UPDATE 01/17/13: patient returns for  urgent followup. Over past 3 weeks, patient has had increasing dizziness, balance difficulty, urination control problems. He feels some additional numbness or weakness in his legs. No difficulty with his hands or arms. No vision changes, slurred speech, trouble talking. Wife notes some more mood swings, irritability, depression, anxiety, attention and focus problems. He also has had more difficulty with hearing.  UPDATE 09/30/12: Since last visit, doing well. Started tecfidera on Jul 21, 2012. Initially flushing and GI sxs, but have eased up. No new MS flares or sxs.  UPDATE 06/19/12: S/p right CEA on 06/10/12. No complications. No new stroke/TIA symptoms. Will proceed with MS treatment.   UPDATE 05/01/12: Since last visit, had MRI brain, c and t spine, confirming chronic MS. On 04/08/12, had sudden onset dizziness, seeing floaters, and left face arm leg numbness x 10 minutes. Didn't tell anyone for 4 days. Had full recovery. Today carotid u/s shows right ICA stenosis (50-69%). Also with fatigue, malaise. Thinking about disability.  PRIOR HPI: 66 year old right-handed male with history of hypertension, hypercholesteremia, coronary artery disease, depression, anxiety, multiple sclerosis, TIA, here for evaluation and management of MS. In 1989, patient developed numbness spots in the back. He was evaluated by Dr. Rosiland Oz ([place]), with MRI and lumbar puncture, and given a diagnosis of MS. We do not have those records to review today. 2 years later he transferred care to Dr. Aggie Hacker [facility]), who treated him with prednisone. Over these years she developed some staggering, balance difficulty, vision problems. In 1995 he was started on Betaseron. Over the  next 10-15 years he was on and off of Betaseron, do to fluctuation in liver function testing. Patient last saw Dr. Aggie Hacker in 2011. He saw a different neurologist at [facility] in 2012. Since that time he has not seen a neurologist. His last MRI was  around 2000 102,011, which is apparently stable. He weaned himself off of Betaseron in 2011 because he felt that his symptoms weren't fairly mild.  In 2013 he is started to develop increasing fatigue, balance difficulty, numbness in his hands and feet. Also having trouble with coordination.   REVIEW OF SYSTEMS: Full 14 system review of systems performed and negative except: appetite reduced joint pain numbness weakness blurred vision.    ALLERGIES: No Known Allergies  HOME MEDICATIONS: Outpatient Medications Prior to Visit  Medication Sig Dispense Refill  . buPROPion (WELLBUTRIN XL) 300 MG 24 hr tablet Take 200 mg by mouth daily.     . carbamazepine (TEGRETOL XR) 100 MG 12 hr tablet Take 1-2 tablets (100-200 mg total) by mouth 2 (two) times daily. 120 tablet 12  . cholecalciferol (VITAMIN D) 1000 UNITS tablet Take 1,000 Units by mouth daily.    . Choline Fenofibrate (FENOFIBRIC ACID) 135 MG CPDR Take 1 capsule by mouth daily.   0  . citalopram (CELEXA) 20 MG tablet Take 20 mg by mouth daily.  0  . Dimethyl Fumarate (TECFIDERA) 240 MG CPDR Take 1 capsule (240 mg total) by mouth 2 (two) times daily. 180 capsule 3  . dipyridamole-aspirin (AGGRENOX) 200-25 MG 12hr capsule Take 1 capsule by mouth 2 (two) times daily.  0  . LORazepam (ATIVAN) 0.5 MG tablet Take 0.5 mg by mouth 2 (two) times daily. nerves    . metoprolol (LOPRESSOR) 50 MG tablet Take 25 mg by mouth 2 (two) times daily.     . Multiple Vitamin (MULTIVITAMIN WITH MINERALS) TABS tablet Take 1 tablet by mouth daily.    . nitroGLYCERIN (NITROSTAT) 0.4 MG SL tablet Place 1 tablet (0.4 mg total) under the tongue every 5 (five) minutes as needed for chest pain. 25 tablet 3  . Omega-3 Fatty Acids (FISH OIL) 1000 MG CAPS Take 1 capsule by mouth 2 (two) times daily.     Marland Kitchen omeprazole (PRILOSEC) 20 MG capsule Take 20 mg by mouth daily.      . pravastatin (PRAVACHOL) 40 MG tablet Take 1 tablet by mouth daily.    . predniSONE (DELTASONE) 10 MG  tablet Take 60mg  on day 1. Reduce by 10mg  each subsequent day. (60, 50, 40, 30, 20, 10, stop) 21 tablet 0   No facility-administered medications prior to visit.     PAST MEDICAL HISTORY: Past Medical History:  Diagnosis Date  . Anxiety   . CAD (coronary artery disease)    had 2 arteries tented four years ago sees Dr. Dannielle Burn  . Carotid artery occlusion   . Depression   . Diabetes mellitus without complication ([place])   . GERD (gastroesophageal reflux disease)   . Headache(784.0)    migraines hx of  . HLD (hyperlipidemia)   . HTN (hypertension)    Sees Dr. Woody Seller  . MS (multiple sclerosis) ([place])    diagnosed in 1989. He was treated with prednisone followed by betaseron which was gradually tapered because his LFTs went up and it resolved off therapy. Likely he is remained in remission for years  . Neuromuscular disorder [facility])    has Multiple Sclerosis, sees Dr. Leta Baptist  . S/P right inguinal herniorrhaphy   . Stroke [facility])    "  patient states has had several TIA's, last one 05/27/12"  . TIA (transient ischemic attack)     PAST SURGICAL HISTORY: Past Surgical History:  Procedure Laterality Date  . CAROTID ENDARTERECTOMY    . COLONOSCOPY  March 12, 2006   Had 2 polyps removed; one was an adenoma and the other one was hyperplastic  . COLONOSCOPY N/A 10/09/2012   Procedure: COLONOSCOPY;  Surgeon: Rogene Houston, MD;  [facility];  Service: Endoscopy;  Laterality: N/A;  1030-rescheduled to 12:00pm per [name]  . ENDARTERECTOMY  06/10/2012   Procedure: ENDARTERECTOMY CAROTID;  Surgeon: Rosetta Posner, MD;  Location: [place];  Service: Vascular;  Laterality: Right;  With Dacron Patch Angioplasty  . EYE SURGERY Right 10/2015   cataract  . HERNIA REPAIR  age 31  . HYDROCELE EXCISION  2011  . stents    . varicose removal from scrotum  1982  . VASECTOMY  1989    FAMILY HISTORY: Family History  Problem Relation Age of Onset  . Diabetes Mother   . Hypertension Mother   . Heart  disease Mother        Before age 74  . Hyperlipidemia Mother   . Heart attack Father   . Lung cancer Father   . Cancer Father        Lung  . Heart disease Father        Before age 42  . Hypertension Son     SOCIAL HISTORY:  Social History   Social History  . Marital status: Married    Spouse name: Jayvien Rowlette  . Number of children: 2  . Years of education: 90yrs   Occupational History  .  [employer]   Social History Main Topics  . Smoking status: Former Smoker    Packs/day: 2.00    Years: 20.00    Types: Cigarettes    Start date: 05/23/1968    Quit date: 08/21/1993  . Smokeless tobacco: Never Used  . Alcohol use No  . Drug use: No  . Sexual activity: Not on file   Other Topics Concern  . Not on file   Social History Narrative   Full time. Married. Works at [employer].    Pt lives at home with his spouse.   Caffeine Use: none     PHYSICAL EXAM  Vitals:   01/09/17 1406  BP: 124/74  Pulse: 69  Weight: 238 lb (108 kg)   Wt Readings from Last 3 Encounters:  01/09/17 238 lb (108 kg)  10/04/16 237 lb 9.6 oz (107.8 kg)  08/23/16 242 lb (109.8 kg)   Body mass index is 32.28 kg/m.  No exam data present  MMSE - Mini Mental State Exam 07/14/2014  Orientation to time 5  Orientation to Place 5  Registration 3  Attention/ Calculation 5  Recall 1  Language- name 2 objects 2  Language- repeat 1  Language- follow 3 step command 3  Language- read & follow direction 1  Write a sentence 1  Copy design 1  Total score 28    GENERAL EXAM: Patient is in no distress; well developed, nourished and groomed; neck is supple  CARDIOVASCULAR: Regular rate and rhythm, no murmurs, no carotid bruits  NEUROLOGIC: MENTAL STATUS: awake, alert, language fluent, comprehension intact, naming intact, fund of knowledge appropriate CRANIAL NERVE: no papilledema on fundoscopic exam, pupils equal and reactive to light, visual fields full to  confrontation, extraocular muscles intact,  MILD END GAZE NYSTAGMUS, facial sensation and strength symmetric, hearing intact, palate elevates symmetrically, uvula midline, shoulder shrug symmetric, tongue midline. MOTOR: normal bulk and tone, full strength in the BUE, BLE SENSORY: normal and symmetric to light touch; EXCEPT DECR VIB IN HANDS AND FEET COORDINATION: finger-nose-finger, fine finger movements normal REFLEXES: deep tendon reflexes present and symmetric GAIT/STATION: narrow based gait; SLIGHT DIFF WITH TANDEM GAIT    DIAGNOSTIC DATA (LABS, IMAGING, TESTING) - I reviewed patient records, labs, notes, testing and imaging myself where available.  Lab Results  Component Value Date   WBC 4.3 01/04/2016   HGB 16.0 07/03/2015   HCT 43.5 01/04/2016   MCV 95 01/04/2016   PLT 194 01/04/2016      Component Value Date/Time   NA 138 01/04/2016 1405   K 4.9 01/04/2016 1405   CL 99 01/04/2016 1405   CO2 21 01/04/2016 1405   GLUCOSE 83 01/04/2016 1405   GLUCOSE 93 07/03/2015 1641   BUN 14 01/04/2016 1405   CREATININE 1.10 10/09/2016 1314   CALCIUM 9.5 01/04/2016 1405   PROT 7.2 01/04/2016 1405   ALBUMIN 4.7 01/04/2016 1405   AST 57 (H) 01/04/2016 1405   ALT 58 (H) 01/04/2016 1405   ALKPHOS 33 (L) 01/04/2016 1405   BILITOT 0.6 01/04/2016 1405   GFRNONAA 70 01/04/2016 1405   GFRAA 81 01/04/2016 1405   No results found for: CHOL, HDL, LDLCALC, LDLDIRECT, TRIG, CHOLHDL Lab Results  Component Value Date   HGBA1C 5.4 01/17/2013   Lab Results  Component Value Date   VITAMINB12 302 07/14/2014   Lab Results  Component Value Date   TSH 2.320 01/17/2013   Vit D, 25-Hydroxy  Date Value Ref Range Status  01/04/2016 31.8 30.0 - 100.0 ng/mL Final    Comment:    Vitamin D deficiency has been defined by the Institute of Medicine and an Endocrine Society practice guideline as a level of serum 25-OH vitamin D less than 20 ng/mL (1,2). The Endocrine Society went on to further define  vitamin D insufficiency as a level between 21 and 29 ng/mL (2). 1. IOM (Institute of Medicine). 2010. Dietary reference    intakes for calcium and D. [place]: The    [employer]. 2. Holick MF, Binkley [place], Bischoff-Ferrari HA, et al.    Evaluation, treatment, and prevention of vitamin D    deficiency: an Endocrine Society clinical practice    guideline. JCEM. 2011 Jul; 96(7):1911-30.    Lymphocytes Absolute  Date Value Ref Range Status  01/04/2016 0.9 0.7 - 3.1 x10E3/uL Final  01/05/2015 0.8 0.7 - 3.1 x10E3/uL Final  07/14/2014 1.0 0.7 - 3.1 x10E3/uL Final    02/06/13 MRI brain (with and without) demonstrating: 1. Multiple periventricular and subcortical chronic demyelinating plaques. Chronic plaque noted in the right medulla. 2. No acute plaques. 3. No change from MRI on 04/02/12.  02/06/13 MRI thoracic spine (with and without) demonstrating: 1. Multiple chronic demyelinating plaques at T5, T6, T7 and T9-10. 2. No acute plaques. 3. No change from MRI on 04/02/12.  10/09/16 MRI brain  1. Stable compared to 07/26/2015.  No explanation for new face pain. 2. Multiple sclerosis with stable plaque burden and no signs of active demyelination. There is a plaque near the left trigeminal root entry zone, but stable. 3. Normal brain volume.  01/06/15 JCV ab - 1.33 H positive    ASSESSMENT AND PLAN  66 y.o. year old male here with multiple sclerosis and prior TIA.  MS -->  Doing well on tecfidera. He reports being steroid intolerant in past (hallucinations, stomach irritation, weight gain).   TIA --> due to hypertension, hypercholesterolemia. Had right brain TIA on 04/08/12, with right ICA stenosis, s/p right CEA 06/10/12.   Dx:  Multiple sclerosis ([place])  Trigeminal neuralgia of left side of face  Gait difficulty    PLAN:  LEFT FACIAL PAIN / TRIGEMINAL NEURALGIA --> RESOLVED  - try tapering off carbamazepine 100mg  BID for left trigeminal  neuralgia  MULTIPLE SCLEROSIS DISEASE MODIFYING THERAPY - continue tecfidera for multiple sclerosis - annual CBC, CMP per PCP  STROKE PREVENTION - continue stroke prevention with aggrenox, statin, BP control per PCP  Return in about 6 months (around 07/12/2017).      Penni Bombard, MD 8/00/3491, 7:91 PM Certified in Neurology, Neurophysiology and Neuroimaging  [facility] [address], [place], [place] [address] [phone]

## 2017-06-12 ENCOUNTER — Telehealth: Payer: Self-pay | Admitting: Diagnostic Neuroimaging

## 2017-06-12 MED ORDER — DIMETHYL FUMARATE 240 MG PO CPDR: 1.0000 | DELAYED_RELEASE_CAPSULE | Freq: Two times a day (BID) | ORAL | 3 refills | Status: DC

## 2017-06-12 NOTE — Telephone Encounter (Signed)
Pt's wife refill for Dimethyl Fumarate (TECFIDERA) 240 MG CPDR sent to [facility] (f) [phone] thru [facility].

## 2017-06-12 NOTE — Telephone Encounter (Signed)
Tecfidera refill Rx sent to [facility] through patient assistance program.

## 2017-07-17 ENCOUNTER — Encounter: Payer: Self-pay | Admitting: Diagnostic Neuroimaging

## 2017-07-17 ENCOUNTER — Ambulatory Visit: Payer: Medicare Other | Admitting: Diagnostic Neuroimaging

## 2017-07-17 VITALS — BP 151/94 | HR 64 | Ht 72.0 in | Wt 246.6 lb

## 2017-07-17 DIAGNOSIS — G35 Multiple sclerosis: Secondary | ICD-10-CM | POA: Diagnosis not present

## 2017-07-17 DIAGNOSIS — R269 Unspecified abnormalities of gait and mobility: Secondary | ICD-10-CM | POA: Diagnosis not present

## 2017-07-17 NOTE — Patient Instructions (Signed)
-  continue current meds  

## 2017-07-17 NOTE — Progress Notes (Signed)
[facility]  PATIENT: Grant Valdez DOB: Jan 10, 1951  REFERRING CLINICIAN:  HISTORY FROM: patient  REASON FOR VISIT: follow up   HISTORICAL  CHIEF COMPLAINT:  Chief Complaint  Patient presents with  . Follow-up  . Multiple Sclerosis    HISTORY OF PRESENT ILLNESS:   UPDATE (07/17/17, VRP): Since last visit, doing well. Tolerating tecfidera. No alleviating or aggravating factors.   UPDATE 01/09/17: Since last visit, left facial pain improved. Tolerating CBZ. Now on CBZ 100mg  BID. No new MS symptoms.  UPDATE 10/04/16: Since last visit, was doing well until yesterday; now having new onset of left facial pain, intermittent, shooting electrical pain, from left lower jaw up to left eye. Triggered by brushing teeth, licking lip or touching. Multiple attacks per day. Tolerating tecfidera.   UPDATE 01/04/16: Since last visit, doing about the same. No new sxs. Tolerating tecfidera.   UPDATE 07/07/15: No new issues. Tolerating meds. Overall doing well.  UPDATE 01/05/15: Since last visit, overall stable. Still notes some balance difficulty. He is trying to increase his walking back up to 2 miles per day. Tolerating meds. No new issues.  UPDATE 07/14/14 (LL): Patient failed to follow up for a year, was supposed to follow up in 3 months after last visit. He did not want to do PT. He has been well on Tecfidera with only flushing side effect. He presents today to have labs and get refills for his medication. Has fallen 2-3 times in the last year, no injuries. Complaints of bilateral finger numbness and paresthesias, slower cognitive processing time, fatigue. MMSE 28/30, AFT 18.  UPDATE 06/17/13: Patient presents for followup. Since last visit he has fallen 4 times including breaking his left foot, almost 4 months ago. Otherwise patient is stable. He still has issues with fatigue depression anxiety.  UPDATE 01/17/13: patient returns for urgent followup. Over past 3 weeks,  patient has had increasing dizziness, balance difficulty, urination control problems. He feels some additional numbness or weakness in his legs. No difficulty with his hands or arms. No vision changes, slurred speech, trouble talking. Wife notes some more mood swings, irritability, depression, anxiety, attention and focus problems. He also has had more difficulty with hearing.  UPDATE 09/30/12: Since last visit, doing well. Started tecfidera on Jul 21, 2012. Initially flushing and GI sxs, but have eased up. No new MS flares or sxs.  UPDATE 06/19/12: S/p right CEA on 06/10/12. No complications. No new stroke/TIA symptoms. Will proceed with MS treatment.   UPDATE 05/01/12: Since last visit, had MRI brain, c and t spine, confirming chronic MS. On 04/08/12, had sudden onset dizziness, seeing floaters, and left face arm leg numbness x 10 minutes. Didn't tell anyone for 4 days. Had full recovery. Today carotid u/s shows right ICA stenosis (50-69%). Also with fatigue, malaise. Thinking about disability.  PRIOR HPI: 66 year old right-handed male with history of hypertension, hypercholesteremia, coronary artery disease, depression, anxiety, multiple sclerosis, TIA, here for evaluation and management of MS. In 1989, patient developed numbness spots in the back. He was evaluated by Dr. Rosiland Oz ([place]), with MRI and lumbar puncture, and given a diagnosis of MS. We do not have those records to review today. 2 years later he transferred care to Dr. Aggie Hacker [facility]), who treated him with prednisone. Over these years she developed some staggering, balance difficulty, vision problems. In 1995 he was started on Betaseron. Over the next 10-15 years he was on and off of Betaseron, do to fluctuation in liver function testing. Patient  last saw Dr. Aggie Hacker in 2011. He saw a different neurologist at [facility] in 2012. Since that time he has not seen a neurologist. His last MRI was around 2000 102,011, which is apparently  stable. He weaned himself off of Betaseron in 2011 because he felt that his symptoms weren't fairly mild.  In 2013 he is started to develop increasing fatigue, balance difficulty, numbness in his hands and feet. Also having trouble with coordination.  REVIEW OF SYSTEMS: Full 14 system review of systems performed and negative except: hearing loss flushing.  ALLERGIES: No Known Allergies  HOME MEDICATIONS: Outpatient Medications Prior to Visit  Medication Sig Dispense Refill  . buPROPion (WELLBUTRIN XL) 300 MG 24 hr tablet Take 200 mg by mouth daily.     . cholecalciferol (VITAMIN D) 1000 UNITS tablet Take 1,000 Units by mouth daily.    . Choline Fenofibrate (FENOFIBRIC ACID) 135 MG CPDR Take 1 capsule by mouth daily.   0  . citalopram (CELEXA) 20 MG tablet Take 20 mg by mouth daily.  0  . Dimethyl Fumarate (TECFIDERA) 240 MG CPDR Take 1 capsule (240 mg total) by mouth 2 (two) times daily. 180 capsule 3  . dipyridamole-aspirin (AGGRENOX) 200-25 MG 12hr capsule Take 1 capsule by mouth 2 (two) times daily.  0  . LORazepam (ATIVAN) 0.5 MG tablet Take 0.5 mg by mouth 2 (two) times daily. nerves    . metoprolol (LOPRESSOR) 50 MG tablet Take 25 mg by mouth 2 (two) times daily.     . Multiple Vitamin (MULTIVITAMIN WITH MINERALS) TABS tablet Take 1 tablet by mouth daily.    . nitroGLYCERIN (NITROSTAT) 0.4 MG SL tablet Place 1 tablet (0.4 mg total) under the tongue every 5 (five) minutes as needed for chest pain. 25 tablet 3  . Omega-3 Fatty Acids (FISH OIL) 1000 MG CAPS Take 1 capsule by mouth 2 (two) times daily.     Marland Kitchen omeprazole (PRILOSEC) 20 MG capsule Take 20 mg by mouth daily.      . pravastatin (PRAVACHOL) 40 MG tablet Take 1 tablet by mouth daily.    . carbamazepine (TEGRETOL XR) 100 MG 12 hr tablet Take 1-2 tablets (100-200 mg total) by mouth 2 (two) times daily. (Patient not taking: Reported on 07/17/2017) 120 tablet 12   No facility-administered medications prior to visit.     PAST  MEDICAL HISTORY: Past Medical History:  Diagnosis Date  . Anxiety   . CAD (coronary artery disease)    had 2 arteries tented four years ago sees Dr. Dannielle Burn  . Carotid artery occlusion   . Depression   . Diabetes mellitus without complication ([place])   . GERD (gastroesophageal reflux disease)   . Headache(784.0)    migraines hx of  . HLD (hyperlipidemia)   . HTN (hypertension)    Sees Dr. Woody Seller  . MS (multiple sclerosis) ([place])    diagnosed in 1989. He was treated with prednisone followed by betaseron which was gradually tapered because his LFTs went up and it resolved off therapy. Likely he is remained in remission for years  . Neuromuscular disorder [facility])    has Multiple Sclerosis, sees Dr. Leta Baptist  . S/P right inguinal herniorrhaphy   . Stroke [facility])    "patient states has had several TIA's, last one 05/27/12"  . TIA (transient ischemic attack)     PAST SURGICAL HISTORY: Past Surgical History:  Procedure Laterality Date  . CAROTID ENDARTERECTOMY    . COLONOSCOPY  March 12, 2006   Had  2 polyps removed; one was an adenoma and the other one was hyperplastic  . COLONOSCOPY N/A 10/09/2012   Procedure: COLONOSCOPY;  Surgeon: Rogene Houston, MD;  [facility];  Service: Endoscopy;  Laterality: N/A;  1030-rescheduled to 12:00pm per [name]  . ENDARTERECTOMY  06/10/2012   Procedure: ENDARTERECTOMY CAROTID;  Surgeon: Rosetta Posner, MD;  Location: [place];  Service: Vascular;  Laterality: Right;  With Dacron Patch Angioplasty  . EYE SURGERY Right 10/2015   cataract  . HERNIA REPAIR  age 18  . HYDROCELE EXCISION  2011  . stents    . varicose removal from scrotum  1982  . VASECTOMY  1989    FAMILY HISTORY: Family History  Problem Relation Age of Onset  . Diabetes Mother   . Hypertension Mother   . Heart disease Mother        Before age 49  . Hyperlipidemia Mother   . Heart attack Father   . Lung cancer Father   . Cancer Father        Lung  . Heart disease Father         Before age 81  . Hypertension Son     SOCIAL HISTORY:  Social History   Socioeconomic History  . Marital status: Married    Spouse name: Ikaika Showers  . Number of children: 2  . Years of education: 43yrs  . Highest education level: Not on file  Social Needs  . Financial resource strain: Not on file  . Food insecurity - worry: Not on file  . Food insecurity - inability: Not on file  . Transportation needs - medical: Not on file  . Transportation needs - non-medical: Not on file  Occupational History    Employer: [employer]    Comment: [employer]  Tobacco Use  . Smoking status: Former Smoker    Packs/day: 2.00    Years: 20.00    Pack years: 40.00    Types: Cigarettes    Start date: 05/23/1968    Last attempt to quit: 08/21/1993    Years since quitting: 23.9  . Smokeless tobacco: Never Used  Substance and Sexual Activity  . Alcohol use: No    Alcohol/week: 0.0 oz  . Drug use: No  . Sexual activity: Not on file  Other Topics Concern  . Not on file  Social History Narrative   Full time. Married. Works at [employer].    Pt lives at home with his spouse.   Caffeine Use: none     PHYSICAL EXAM  Vitals:   07/17/17 1333  BP: (!) 151/94  Pulse: 64  Weight: 246 lb 9.6 oz (111.9 kg)  Height: 6' (1.829 m)   Wt Readings from Last 3 Encounters:  07/17/17 246 lb 9.6 oz (111.9 kg)  01/09/17 238 lb (108 kg)  10/04/16 237 lb 9.6 oz (107.8 kg)   Body mass index is 33.44 kg/m.   Visual Acuity Screening   Right eye Left eye Both eyes  Without correction: 20/40 20/30   With correction:       MMSE - Mini Mental State Exam 07/14/2014  Orientation to time 5  Orientation to Place 5  Registration 3  Attention/ Calculation 5  Recall 1  Language- name 2 objects 2  Language- repeat 1  Language- follow 3 step command 3  Language- read & follow direction 1  Write a sentence 1  Copy design 1  Total score 28  GENERAL EXAM: Patient is in no  distress; well developed, nourished and groomed; neck is supple  CARDIOVASCULAR: Regular rate and rhythm, no murmurs, no carotid bruits  NEUROLOGIC: MENTAL STATUS: awake, alert, language fluent, comprehension intact, naming intact, fund of knowledge appropriate CRANIAL NERVE: no papilledema on fundoscopic exam, pupils equal and reactive to light, visual fields full to confrontation, extraocular muscles intact, MINIMAL END GAZE NYSTAGMUS ON LEFT AND RIGHT GAZE; facial sensation and strength symmetric, hearing intact, palate elevates symmetrically, uvula midline, shoulder shrug symmetric, tongue midline. MOTOR: normal bulk and tone, full strength in the BUE, BLE SENSORY: normal and symmetric to light touch; EXCEPT DECR VIB IN HANDS AND FEET COORDINATION: finger-nose-finger, fine finger movements --> SLIGHT PAST POINT ON RIGHT HAND; MILD INTENTION TREMOR IN LEFT HAND REFLEXES: deep tendon reflexes present and symmetric GAIT/STATION: narrow based gait    DIAGNOSTIC DATA (LABS, IMAGING, TESTING) - I reviewed patient records, labs, notes, testing and imaging myself where available.  Lab Results  Component Value Date   WBC 4.3 01/04/2016   HGB 15.2 01/04/2016   HCT 43.5 01/04/2016   MCV 95 01/04/2016   PLT 194 01/04/2016      Component Value Date/Time   NA 138 01/04/2016 1405   K 4.9 01/04/2016 1405   CL 99 01/04/2016 1405   CO2 21 01/04/2016 1405   GLUCOSE 83 01/04/2016 1405   GLUCOSE 93 07/03/2015 1641   BUN 14 01/04/2016 1405   CREATININE 1.10 10/09/2016 1314   CALCIUM 9.5 01/04/2016 1405   PROT 7.2 01/04/2016 1405   ALBUMIN 4.7 01/04/2016 1405   AST 57 (H) 01/04/2016 1405   ALT 58 (H) 01/04/2016 1405   ALKPHOS 33 (L) 01/04/2016 1405   BILITOT 0.6 01/04/2016 1405   GFRNONAA 70 01/04/2016 1405   GFRAA 81 01/04/2016 1405   No results found for: CHOL, HDL, LDLCALC, LDLDIRECT, TRIG, CHOLHDL Lab Results  Component Value Date   HGBA1C 5.4 01/17/2013   Lab Results  Component  Value Date   VITAMINB12 302 07/14/2014   Lab Results  Component Value Date   TSH 2.320 01/17/2013   Vit D, 25-Hydroxy  Date Value Ref Range Status  01/04/2016 31.8 30.0 - 100.0 ng/mL Final    Comment:    Vitamin D deficiency has been defined by the Institute of Medicine and an Endocrine Society practice guideline as a level of serum 25-OH vitamin D less than 20 ng/mL (1,2). The Endocrine Society went on to further define vitamin D insufficiency as a level between 21 and 29 ng/mL (2). 1. IOM (Institute of Medicine). 2010. Dietary reference    intakes for calcium and D. [place]: The    [employer]. 2. Holick MF, Binkley [place], Bischoff-Ferrari HA, et al.    Evaluation, treatment, and prevention of vitamin D    deficiency: an Endocrine Society clinical practice    guideline. JCEM. 2011 Jul; 96(7):1911-30.    Lymphocytes Absolute  Date Value Ref Range Status  01/04/2016 0.9 0.7 - 3.1 x10E3/uL Final  01/05/2015 0.8 0.7 - 3.1 x10E3/uL Final  07/14/2014 1.0 0.7 - 3.1 x10E3/uL Final    02/06/13 MRI brain (with and without) demonstrating: 1. Multiple periventricular and subcortical chronic demyelinating plaques. Chronic plaque noted in the right medulla. 2. No acute plaques. 3. No change from MRI on 04/02/12.  02/06/13 MRI thoracic spine (with and without) demonstrating: 1. Multiple chronic demyelinating plaques at T5, T6, T7 and T9-10. 2. No acute plaques. 3. No change from MRI on 04/02/12.  10/09/16 MRI brain  1. Stable compared to 07/26/2015.  No explanation for new face pain. 2. Multiple sclerosis with stable plaque burden and no signs of active demyelination. There is a plaque near the left trigeminal root entry zone, but stable. 3. Normal brain volume.  01/06/15 JCV ab - 1.33 H positive    ASSESSMENT AND PLAN  66 y.o. year old male here with multiple sclerosis and prior TIA.  MS --> since 1989; on betaseron 1995-2000; now doing well on tecfidera since  Dec 2013. He reports being steroid intolerant in past (hallucinations, stomach irritation, weight gain).   TIA --> due to hypertension, hypercholesterolemia. Had right brain TIA on 04/08/12, with right ICA stenosis, s/p right CEA 06/10/12.   Dx:  Multiple sclerosis ([place])  Gait difficulty    PLAN:  MULTIPLE SCLEROSIS DISEASE MODIFYING THERAPY - continue tecfidera for multiple sclerosis - annual CBC, CMP per PCP  LEFT FACIAL PAIN / TRIGEMINAL NEURALGIA --> RESOLVED  - monitor  STROKE PREVENTION - continue stroke prevention with aggrenox, statin, BP control per PCP  Return in about 9 months (around 04/16/2018) for with NP.      Penni Bombard, MD 21/74/7159, 5:39 PM Certified in Neurology, Neurophysiology and Neuroimaging  [facility] [address], [place], [place] [address] [phone]

## 2017-11-06 ENCOUNTER — Telehealth: Payer: Self-pay | Admitting: Diagnostic Neuroimaging

## 2017-11-06 MED ORDER — CARBAMAZEPINE ER 100 MG PO TB12: 100.0000 mg | ORAL_TABLET | Freq: Two times a day (BID) | ORAL | 12 refills | Status: DC

## 2017-11-06 NOTE — Telephone Encounter (Signed)
Renewed.

## 2017-11-06 NOTE — Telephone Encounter (Signed)
Pt wife(on DPR) calling for refill prescription for carbamazepine (TEGRETOL XR) 100 MG 12 hr tablet  [facility] [place] [place] [address])  [phone]

## 2017-11-06 NOTE — Addendum Note (Signed)
Addended by: Oliver Hum S on: 11/06/2017 01:00 PM   Modules accepted: Orders

## 2017-11-07 ENCOUNTER — Encounter (INDEPENDENT_AMBULATORY_CARE_PROVIDER_SITE_OTHER): Payer: Self-pay | Admitting: *Deleted

## 2017-11-22 ENCOUNTER — Ambulatory Visit: Payer: Medicare Other | Admitting: Cardiovascular Disease

## 2017-11-22 ENCOUNTER — Encounter: Payer: Self-pay | Admitting: *Deleted

## 2017-11-22 ENCOUNTER — Telehealth: Payer: Self-pay | Admitting: Cardiovascular Disease

## 2017-11-22 ENCOUNTER — Encounter: Payer: Self-pay | Admitting: Cardiovascular Disease

## 2017-11-22 ENCOUNTER — Other Ambulatory Visit: Payer: Self-pay

## 2017-11-22 VITALS — BP 151/79 | HR 63 | Ht 72.0 in | Wt 240.0 lb

## 2017-11-22 DIAGNOSIS — E785 Hyperlipidemia, unspecified: Secondary | ICD-10-CM | POA: Diagnosis not present

## 2017-11-22 DIAGNOSIS — I25118 Atherosclerotic heart disease of native coronary artery with other forms of angina pectoris: Secondary | ICD-10-CM | POA: Diagnosis not present

## 2017-11-22 DIAGNOSIS — I10 Essential (primary) hypertension: Secondary | ICD-10-CM

## 2017-11-22 DIAGNOSIS — R079 Chest pain, unspecified: Secondary | ICD-10-CM

## 2017-11-22 MED ORDER — NITROGLYCERIN 0.4 MG SL SUBL: 0.4000 mg | SUBLINGUAL_TABLET | SUBLINGUAL | 3 refills | Status: AC | PRN

## 2017-11-22 NOTE — Telephone Encounter (Signed)
Pre-cert Verification for the following procedure   Exercise myoview  scheduled for 11/28/17

## 2017-11-22 NOTE — Progress Notes (Signed)
SUBJECTIVE: The patient presents for past due follow-up.  I last saw him in March 2017. He has a past medical history significant for coronary artery disease with prior percutaneous coronary intervention (drug-eluting stents to the mid RCA and proximal LAD in 2002), TIAs, right carotid endarterectomy in October 2013, hyperlipidemia, hypertension, and multiple sclerosis.  ECG performed in the office today which I ordered and personally interpreted demonstrates sinus bradycardia, 59 bpm, with no ischemic ST segment or T-wave abnormalities, nor any arrhythmias.  He is here with his wife.  He told me last week he was awoken by chest pain.  This is never occurred before.  He took a few deep breaths and then went back to sleep.  He does have some exertional dyspnea but this is stable.  He does not endorse changes in energy levels per se.  He does feel fatigued but also has multiple sclerosis and this has not changed either.  Blood pressure at his PCPs office last week was 120/82.   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  Review of Systems: As per "subjective", otherwise negative.  No Known Allergies  Current Outpatient Medications  Medication Sig Dispense Refill  . buPROPion (WELLBUTRIN XL) 300 MG 24 hr tablet Take 200 mg by mouth daily.     . carbamazepine (TEGRETOL XR) 100 MG 12 hr tablet Take 1-2 tablets (100-200 mg total) by mouth 2 (two) times daily. 120 tablet 12  . cholecalciferol (VITAMIN D) 1000 UNITS tablet Take 1,000 Units by mouth daily.    . Choline Fenofibrate (FENOFIBRIC ACID) 135 MG CPDR Take 1 capsule by mouth daily.   0  . citalopram (CELEXA) 20 MG tablet Take 20 mg by mouth daily.  0  . Dimethyl Fumarate (TECFIDERA) 240 MG CPDR Take 1 capsule (240 mg total) by mouth 2 (two) times daily. 180 capsule 3  . dipyridamole-aspirin (AGGRENOX) 200-25 MG 12hr capsule Take 1 capsule by mouth 2 (two) times daily.  0   . LORazepam (ATIVAN) 0.5 MG tablet Take 0.5 mg by mouth 2 (two) times daily. nerves    . metoprolol (LOPRESSOR) 50 MG tablet Take 25 mg by mouth 2 (two) times daily.     . Multiple Vitamin (MULTIVITAMIN WITH MINERALS) TABS tablet Take 1 tablet by mouth daily.    . nitroGLYCERIN (NITROSTAT) 0.4 MG SL tablet Place 1 tablet (0.4 mg total) under the tongue every 5 (five) minutes as needed for chest pain. 25 tablet 3  . Omega-3 Fatty Acids (FISH OIL) 1000 MG CAPS Take 1 capsule by mouth 2 (two) times daily.     Marland Kitchen omeprazole (PRILOSEC) 20 MG capsule Take 20 mg by mouth daily.      . pravastatin (PRAVACHOL) 40 MG tablet Take 1 tablet by mouth daily.     No current facility-administered medications for this visit.     Past Medical History:  Diagnosis Date  . Anxiety   . CAD (coronary artery disease)    had 2 arteries tented four years ago sees Dr. Dannielle Burn  . Carotid artery occlusion   . Depression   . Diabetes mellitus without complication ([place])   . GERD (gastroesophageal reflux disease)   . Headache(784.0)    migraines hx of  . HLD (hyperlipidemia)   . HTN (hypertension)    Sees Dr. Woody Seller  . MS (multiple sclerosis) ([place])    diagnosed in 1989. He was treated  with prednisone followed by betaseron which was gradually tapered because his LFTs went up and it resolved off therapy. Likely he is remained in remission for years  . Neuromuscular disorder [facility])    has Multiple Sclerosis, sees Dr. Leta Baptist  . S/P right inguinal herniorrhaphy   . Stroke [facility])    "patient states has had several TIA's, last one 05/27/12"  . TIA (transient ischemic attack)     Past Surgical History:  Procedure Laterality Date  . CAROTID ENDARTERECTOMY    . COLONOSCOPY  March 12, 2006   Had 2 polyps removed; one was an adenoma and the other one was hyperplastic  . COLONOSCOPY N/A 10/09/2012   Procedure: COLONOSCOPY;  Surgeon: Rogene Houston, MD;  [facility];  Service: Endoscopy;  Laterality: N/A;   1030-rescheduled to 12:00pm per [name]  . ENDARTERECTOMY  06/10/2012   Procedure: ENDARTERECTOMY CAROTID;  Surgeon: Rosetta Posner, MD;  Location: [facility];  Service: Vascular;  Laterality: Right;  With Dacron Patch Angioplasty  . EYE SURGERY Right 10/2015   cataract  . HERNIA REPAIR  age 67  . HYDROCELE EXCISION  2011  . stents    . varicose removal from scrotum  1982  . VASECTOMY  1989    Social History   Socioeconomic History  . Marital status: Married    Spouse name: Grant Valdez  . Number of children: 2  . Years of education: 74yrs  . Highest education level: Not on file  Occupational History    Employer: [place]  Social Needs  . Financial resource strain: Not on file  . Food insecurity:    Worry: Not on file    Inability: Not on file  . Transportation needs:    Medical: Not on file    Non-medical: Not on file  Tobacco Use  . Smoking status: Former Smoker    Packs/day: 2.00    Years: 20.00    Pack years: 40.00    Types: Cigarettes    Start date: 05/23/1968    Last attempt to quit: 08/21/1993    Years since quitting: 24.2  . Smokeless tobacco: Never Used  Substance and Sexual Activity  . Alcohol use: No    Alcohol/week: 0.0 oz  . Drug use: No  . Sexual activity: Not on file  Lifestyle  . Physical activity:    Days per week: Not on file    Minutes per session: Not on file  . Stress: Not on file  Relationships  . Social connections:    Talks on phone: Not on file    Gets together: Not on file    Attends religious service: Not on file    Active member of club or organization: Not on file    Attends meetings of clubs or organizations: Not on file    Relationship status: Not on file  . Intimate partner violence:    Fear of current or ex partner: Not on file    Emotionally abused: Not on file    Physically abused: Not on file    Forced sexual activity: Not on file  Other Topics Concern  . Not on file  Social History Narrative     Full time. Married. Works at [employer].    Pt lives at home with his spouse.   Caffeine Use: none     Vitals:   11/22/17 1327  BP: (!) 151/79  Pulse: 63  SpO2: 96%  Weight: 240 lb (108.9 kg)  Height: 6' (1.829 m)    Wt Readings from Last 3 Encounters:  11/22/17 240 lb (108.9 kg)  07/17/17 246 lb 9.6 oz (111.9 kg)  01/09/17 238 lb (108 kg)     PHYSICAL EXAM General: NAD HEENT: Normal. Neck: No JVD, no thyromegaly. Lungs: Clear to auscultation bilaterally with normal respiratory effort. CV: Regular rate and rhythm, normal S1/S2, no S3/S4, no murmur. No pretibial or periankle edema.  No carotid bruit.   Abdomen: Soft, nontender, no distention.  Neurologic: Alert and oriented.  Psych: Normal affect. Skin: Normal. Musculoskeletal: No gross deformities.    ECG: Most recent ECG reviewed.   Labs: Lab Results  Component Value Date/Time   K 4.9 01/04/2016 02:05 PM   BUN 14 01/04/2016 02:05 PM   CREATININE 1.10 10/09/2016 01:14 PM   ALT 58 (H) 01/04/2016 02:05 PM   TSH 2.320 01/17/2013 02:22 PM   HGB 15.2 01/04/2016 02:05 PM     Lipids: No results found for: LDLCALC, LDLDIRECT, CHOL, TRIG, HDL     ASSESSMENT AND PLAN:  1. CAD with mid RCA and proximal LAD stents with chest pain: He was awoken by chest pain which is concerning given his history.  He presently denies any exertional symptoms.  Given his history, I will obtain an exercise Myoview stress test to evaluate for large ischemic territories. On metoprolol, Aggrenox, and statin.  I will hold metoprolol the morning of the stress test.  2. Essential HTN: Elevated today.  He is on metoprolol.  He said it was normal at his PCPs office within the past 2 weeks.  This will need further monitoring to determine if antihypertensive titration is indicated.  3. Hyperlipidemia: On statin therapy.  I will obtain copy of lipids from PCP.    Disposition: Follow up 2 months   Kate Sable, M.D.,  F.A.C.C.

## 2017-11-22 NOTE — Patient Instructions (Addendum)
Your physician recommends that you schedule a follow-up appointment in: [place] has recommended you make the following change in your medication:   NITROGLYCERIN 0.4 MG AS NEEDED FOR CHEST PAIN SEE INFO BELOW    Your physician has requested that you have en exercise stress myoview. For further information please visit [email or web address]. Please follow instruction sheet, as given.  Nitroglycerin sublingual tablets What is this medicine? NITROGLYCERIN (nye troe GLI ser in) is a type of vasodilator. It relaxes blood vessels, increasing the blood and oxygen supply to your heart. This medicine is used to relieve chest pain caused by angina. It is also used to prevent chest pain before activities like climbing stairs, going outdoors in cold weather, or sexual activity. This medicine may be used for other purposes; ask your health care provider or pharmacist if you have questions. COMMON BRAND NAME(S): Nitroquick, Nitrostat, Nitrotab What should I tell my health care provider before I take this medicine? They need to know if you have any of these conditions: -anemia -head injury, recent stroke, or bleeding in the brain -liver disease -previous heart attack -an unusual or allergic reaction to nitroglycerin, other medicines, foods, dyes, or preservatives -pregnant or trying to get pregnant -breast-feeding How should I use this medicine? Take this medicine by mouth as needed. At the first sign of an angina attack (chest pain or tightness) place one tablet under your tongue. You can also take this medicine 5 to 10 minutes before an event likely to produce chest pain. Follow the directions on the prescription label. Let the tablet dissolve under the tongue. Do not swallow whole. Replace the dose if you accidentally swallow it. It will help if your mouth is not dry. Saliva around the tablet will help it to dissolve more quickly. Do not eat or drink, smoke or chew  tobacco while a tablet is dissolving. If you are not better within 5 minutes after taking ONE dose of nitroglycerin, call 9-1-1 immediately to seek emergency medical care. Do not take more than 3 nitroglycerin tablets over 15 minutes. If you take this medicine often to relieve symptoms of angina, your doctor or health care professional may provide you with different instructions to manage your symptoms. If symptoms do not go away after following these instructions, it is important to call 9-1-1 immediately. Do not take more than 3 nitroglycerin tablets over 15 minutes. Talk to your pediatrician regarding the use of this medicine in children. Special care may be needed. Overdosage: If you think you have taken too much of this medicine contact a poison control center or emergency room at once. NOTE: This medicine is only for you. Do not share this medicine with others. What if I miss a dose? This does not apply. This medicine is only used as needed. What may interact with this medicine? Do not take this medicine with any of the following medications: -certain migraine medicines like ergotamine and dihydroergotamine (DHE) -medicines used to treat erectile dysfunction like sildenafil, tadalafil, and vardenafil -riociguat This medicine may also interact with the following medications: -alteplase -aspirin -heparin -medicines for high blood pressure -medicines for mental depression -other medicines used to treat angina -phenothiazines like chlorpromazine, mesoridazine, prochlorperazine, thioridazine This list may not describe all possible interactions. Give your health care provider a list of all the medicines, herbs, non-prescription drugs, or dietary supplements you use. Also tell them if you smoke, drink alcohol, or use illegal drugs. Some items may interact with your medicine. What  should I watch for while using this medicine? Tell your doctor or health care professional if you feel your medicine  is no longer working. Keep this medicine with you at all times. Sit or lie down when you take your medicine to prevent falling if you feel dizzy or faint after using it. Try to remain calm. This will help you to feel better faster. If you feel dizzy, take several deep breaths and lie down with your feet propped up, or bend forward with your head resting between your knees. You may get drowsy or dizzy. Do not drive, use machinery, or do anything that needs mental alertness until you know how this drug affects you. Do not stand or sit up quickly, especially if you are an older patient. This reduces the risk of dizzy or fainting spells. Alcohol can make you more drowsy and dizzy. Avoid alcoholic drinks. Do not treat yourself for coughs, colds, or pain while you are taking this medicine without asking your doctor or health care professional for advice. Some ingredients may increase your blood pressure. What side effects may I notice from receiving this medicine? Side effects that you should report to your doctor or health care professional as soon as possible: -blurred vision -dry mouth -skin rash -sweating -the feeling of extreme pressure in the head -unusually weak or tired Side effects that usually do not require medical attention (report to your doctor or health care professional if they continue or are bothersome): -flushing of the face or neck -headache -irregular heartbeat, palpitations -nausea, vomiting This list may not describe all possible side effects. Call your doctor for medical advice about side effects. You may report side effects to FDA at 1-800-FDA-1088. Where should I keep my medicine? Keep out of the reach of children. Store at room temperature between 20 and 25 degrees C (68 and 77 degrees F). Store in [facility]. Protect from light and moisture. Keep tightly closed. Throw away any unused medicine after the expiration date. NOTE: This sheet is a summary. It may not cover  all possible information. If you have questions about this medicine, talk to your doctor, pharmacist, or health care provider.  2018 Elsevier/Gold Standard (2013-06-05 17:57:36)

## 2017-11-28 ENCOUNTER — Encounter (HOSPITAL_COMMUNITY): Payer: Self-pay

## 2017-11-28 ENCOUNTER — Encounter (HOSPITAL_BASED_OUTPATIENT_CLINIC_OR_DEPARTMENT_OTHER)
Admission: RE | Admit: 2017-11-28 | Discharge: 2017-11-28 | Disposition: A | Discharge status: Home / Self Care | Payer: Medicare Other | Source: Ambulatory Visit | Attending: Cardiovascular Disease | Admitting: Cardiovascular Disease

## 2017-11-28 ENCOUNTER — Encounter (HOSPITAL_COMMUNITY)
Admission: RE | Admit: 2017-11-28 | Discharge: 2017-11-28 | Disposition: A | Discharge status: Home / Self Care | Payer: Medicare Other | Source: Ambulatory Visit | Attending: Cardiovascular Disease | Admitting: Cardiovascular Disease

## 2017-11-28 DIAGNOSIS — R079 Chest pain, unspecified: Secondary | ICD-10-CM

## 2017-11-28 LAB — NM MYOCAR MULTI W/SPECT W/WALL MOTION / EF
CHL CUP MPHR: 156 {beats}/min
CHL CUP NUCLEAR SRS: 0
CSEPED: 8 min
CSEPEDS: 28 s
CSEPHR: 81 %
Estimated workload: 10.1 METS
LV dias vol: 76 mL (ref 62–150)
LVSYSVOL: 26 mL
NUC STRESS TID: 0.77
Peak HR: 127 {beats}/min
RATE: 0.38
RPE: 17
Rest HR: 68 {beats}/min
SDS: 0
SSS: 0

## 2017-11-28 MED ORDER — SODIUM CHLORIDE 0.9% FLUSH
INTRAVENOUS | Status: AC
  Administered 2017-11-28: 10 mL via INTRAVENOUS
  Filled 2017-11-28: qty 10

## 2017-11-28 MED ORDER — TECHNETIUM TC 99M TETROFOSMIN IV KIT
10.0000 | PACK | Freq: Once | INTRAVENOUS | Status: AC | PRN
  Administered 2017-11-28: 10.9 via INTRAVENOUS

## 2017-11-28 MED ORDER — TECHNETIUM TC 99M TETROFOSMIN IV KIT
30.0000 | PACK | Freq: Once | INTRAVENOUS | Status: AC | PRN
  Administered 2017-11-28: 30 via INTRAVENOUS

## 2017-11-28 MED ORDER — REGADENOSON 0.4 MG/5ML IV SOLN
INTRAVENOUS | Status: AC
  Administered 2017-11-28: 0.4 mg via INTRAVENOUS
  Filled 2017-11-28: qty 5

## 2017-12-04 ENCOUNTER — Telehealth: Payer: Self-pay | Admitting: *Deleted

## 2017-12-04 NOTE — Telephone Encounter (Signed)
Notes recorded by Laurine Blazer, LPN on 05/19/2445 at 28:63 AM EDT Patient notified. Copy to pmd. Follow up scheduled for 01/04/2018 with Dr. Bronson Ing in [place] office.   ------  Notes recorded by Laurine Blazer, LPN on 04/07/7115 at 5:79 PM EDT Left message to return call.  ------  Notes recorded by Arnoldo Lenis, MD on 11/29/2017 at 2:59 PM EDT Stress test overall looks good, no strong evidence of significant blockages, overall low risk test. Has f/u with Dr Raliegh Ip next month to discuss further   Zandra Abts MD

## 2018-01-04 ENCOUNTER — Ambulatory Visit: Payer: Medicare Other | Admitting: Cardiovascular Disease

## 2018-01-04 ENCOUNTER — Encounter: Payer: Self-pay | Admitting: Cardiovascular Disease

## 2018-01-04 VITALS — BP 138/78 | HR 65 | Ht 72.0 in | Wt 239.8 lb

## 2018-01-04 DIAGNOSIS — I10 Essential (primary) hypertension: Secondary | ICD-10-CM | POA: Diagnosis not present

## 2018-01-04 DIAGNOSIS — E785 Hyperlipidemia, unspecified: Secondary | ICD-10-CM

## 2018-01-04 DIAGNOSIS — K219 Gastro-esophageal reflux disease without esophagitis: Secondary | ICD-10-CM

## 2018-01-04 DIAGNOSIS — Z955 Presence of coronary angioplasty implant and graft: Secondary | ICD-10-CM | POA: Diagnosis not present

## 2018-01-04 DIAGNOSIS — I25118 Atherosclerotic heart disease of native coronary artery with other forms of angina pectoris: Secondary | ICD-10-CM

## 2018-01-04 NOTE — Patient Instructions (Signed)

## 2018-01-04 NOTE — Progress Notes (Signed)
SUBJECTIVE: The patient returns for follow-up after undergoing cardiovascular testing performed for the evaluation of chest pain.  He underwent a low risk nuclear stress test on 11/28/2017.  There may have been a mild region of ischemia versus variable soft tissue attenuation.  LVEF 65%.  He has a past medical history significant for coronary artery disease with prior percutaneous coronary intervention (drug-eluting stents to the mid RCA and proximal LAD in 2002), TIAs, right carotid endarterectomy in October 2013, hyperlipidemia, hypertension, and multiple sclerosis.  He denies any chest pain, palpitations, shortness of breath, dizziness, and lightheadedness.  He said he has significant gastroesophageal reflux disease.  He takes omeprazole and wonders if he forgot to take it the day he had chest pain.  He is here with his wife.    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   Review of Systems: As per "subjective", otherwise negative.  No Known Allergies  Current Outpatient Medications  Medication Sig Dispense Refill  . buPROPion (WELLBUTRIN XL) 300 MG 24 hr tablet Take 200 mg by mouth daily.     . carbamazepine (TEGRETOL XR) 100 MG 12 hr tablet Take 1-2 tablets (100-200 mg total) by mouth 2 (two) times daily. 120 tablet 12  . cholecalciferol (VITAMIN D) 1000 UNITS tablet Take 1,000 Units by mouth daily.    . Choline Fenofibrate (FENOFIBRIC ACID) 135 MG CPDR Take 1 capsule by mouth daily.   0  . citalopram (CELEXA) 20 MG tablet Take 20 mg by mouth daily.  0  . Dimethyl Fumarate (TECFIDERA) 240 MG CPDR Take 1 capsule (240 mg total) by mouth 2 (two) times daily. 180 capsule 3  . dipyridamole-aspirin (AGGRENOX) 200-25 MG 12hr capsule Take 1 capsule by mouth 2 (two) times daily.  0  . LORazepam (ATIVAN) 0.5 MG tablet Take 0.5 mg by mouth 2 (two) times daily. nerves    . metoprolol (LOPRESSOR) 50 MG tablet Take 25 mg  by mouth 2 (two) times daily.     . Multiple Vitamin (MULTIVITAMIN WITH MINERALS) TABS tablet Take 1 tablet by mouth daily.    . nitroGLYCERIN (NITROSTAT) 0.4 MG SL tablet Place 1 tablet (0.4 mg total) under the tongue every 5 (five) minutes as needed for chest pain. 25 tablet 3  . nitroGLYCERIN (NITROSTAT) 0.4 MG SL tablet Place 1 tablet (0.4 mg total) under the tongue every 5 (five) minutes as needed for chest pain. 25 tablet 3  . Omega-3 Fatty Acids (FISH OIL) 1000 MG CAPS Take 1 capsule by mouth 2 (two) times daily.     Marland Kitchen omeprazole (PRILOSEC) 20 MG capsule Take 20 mg by mouth daily.      . pravastatin (PRAVACHOL) 40 MG tablet Take 1 tablet by mouth daily.     No current facility-administered medications for this visit.     Past Medical History:  Diagnosis Date  . Anxiety   . CAD (coronary artery disease)    had 2 arteries tented four years ago sees Dr. Dannielle Burn  . Carotid artery occlusion   . Depression   . Diabetes mellitus without complication ([place])   . GERD (gastroesophageal reflux disease)   . Headache(784.0)    migraines hx of  . HLD (hyperlipidemia)   . HTN (hypertension)    Sees Dr. Woody Seller  . MS (multiple sclerosis) ([place])    diagnosed in 1989. He was treated with prednisone followed by betaseron which was  gradually tapered because his LFTs went up and it resolved off therapy. Likely he is remained in remission for years  . Neuromuscular disorder [facility])    has Multiple Sclerosis, sees Dr. Leta Baptist  . S/P right inguinal herniorrhaphy   . Stroke [facility])    "patient states has had several TIA's, last one 05/27/12"  . TIA (transient ischemic attack)     Past Surgical History:  Procedure Laterality Date  . CAROTID ENDARTERECTOMY    . COLONOSCOPY  March 12, 2006   Had 2 polyps removed; one was an adenoma and the other one was hyperplastic  . COLONOSCOPY N/A 10/09/2012   Procedure: COLONOSCOPY;  Surgeon: Rogene Houston, MD;  Location[facility];  Service: Endoscopy;   Laterality: N/A;  1030-rescheduled to 12:00pm per [name]  . ENDARTERECTOMY  06/10/2012   Procedure: ENDARTERECTOMY CAROTID;  Surgeon: Rosetta Posner, MD;  Location: [place];  Service: Vascular;  Laterality: Right;  With Dacron Patch Angioplasty  . EYE SURGERY Right 10/2015   cataract  . HERNIA REPAIR  age 52  . HYDROCELE EXCISION  2011  . stents    . varicose removal from scrotum  1982  . VASECTOMY  1989    Social History   Socioeconomic History  . Marital status: Married    Spouse name: Darald Uzzle  . Number of children: 2  . Years of education: 85yrs  . Highest education level: Not on file  Occupational History    Employer: [employer]  Social Needs  . Financial resource strain: Not on file  . Food insecurity:    Worry: Not on file    Inability: Not on file  . Transportation needs:    Medical: Not on file    Non-medical: Not on file  Tobacco Use  . Smoking status: Former Smoker    Packs/day: 2.00    Years: 20.00    Pack years: 40.00    Types: Cigarettes    Start date: 05/23/1968    Last attempt to quit: 08/21/1993    Years since quitting: 24.3  . Smokeless tobacco: Never Used  Substance and Sexual Activity  . Alcohol use: No    Alcohol/week: 0.0 oz  . Drug use: No  . Sexual activity: Not on file  Lifestyle  . Physical activity:    Days per week: Not on file    Minutes per session: Not on file  . Stress: Not on file  Relationships  . Social connections:    Talks on phone: Not on file    Gets together: Not on file    Attends religious service: Not on file    Active member of club or organization: Not on file    Attends meetings of clubs or organizations: Not on file    Relationship status: Not on file  . Intimate partner violence:    Fear of current or ex partner: Not on file    Emotionally abused: Not on file    Physically abused: Not on file    Forced sexual activity: Not on file  Other Topics Concern  . Not on file  Social  History Narrative   Full time. Married. Works at [employer].    Pt lives at home with his spouse.   Caffeine Use: none     Vitals:   01/04/18 1402  BP: 138/78  Pulse: 65  SpO2: 93%  Weight: 239 lb 12.8 oz (108.8 kg)  Height:  6' (1.829 m)    Wt Readings from Last 3 Encounters:  01/04/18 239 lb 12.8 oz (108.8 kg)  11/22/17 240 lb (108.9 kg)  07/17/17 246 lb 9.6 oz (111.9 kg)     PHYSICAL EXAM General: NAD HEENT: Normal. Neck: No JVD, no thyromegaly. Lungs: Clear to auscultation bilaterally with normal respiratory effort. CV: Regular rate and rhythm, normal S1/S2, no S3/S4, no murmur. No pretibial or periankle edema.   Abdomen: Soft, nontender, no distention.  Neurologic: Alert and oriented.  Psych: Normal affect. Skin: Normal. Musculoskeletal: No gross deformities.    ECG: Most recent ECG reviewed.   Labs: Lab Results  Component Value Date/Time   K 4.9 01/04/2016 02:05 PM   BUN 14 01/04/2016 02:05 PM   CREATININE 1.10 10/09/2016 01:14 PM   ALT 58 (H) 01/04/2016 02:05 PM   TSH 2.320 01/17/2013 02:22 PM   HGB 15.2 01/04/2016 02:05 PM     Lipids: No results found for: LDLCALC, LDLDIRECT, CHOL, TRIG, HDL     ASSESSMENT AND PLAN:  1. CAD with mid RCA and proximal LAD stents with chest pain: Symptomatically stable.  Low risk nuclear stress test with normal left ventricular systolic function as noted above. On metoprolol, Aggrenox, and statin.    No further cardiac testing is indicated at this time.  2. Essential HTN: Controlled.  No changes to therapy.  3. Hyperlipidemia: On statin therapy.   4.  GERD: Continue omeprazole.  I encouraged him to speak with his gastroenterologist about potentially being tested for H. pylori.   Disposition: Follow up 6 months   Kate Sable, M.D., F.A.C.C.

## 2018-02-26 ENCOUNTER — Ambulatory Visit: Payer: Medicare Other | Admitting: Family

## 2018-02-26 ENCOUNTER — Encounter (HOSPITAL_COMMUNITY): Payer: Medicare Other

## 2018-03-12 ENCOUNTER — Other Ambulatory Visit: Payer: Self-pay | Admitting: Diagnostic Neuroimaging

## 2018-04-11 ENCOUNTER — Ambulatory Visit: Payer: Medicare Other | Admitting: Family

## 2018-04-11 ENCOUNTER — Encounter (HOSPITAL_COMMUNITY): Payer: Medicare Other

## 2018-04-15 NOTE — Progress Notes (Signed)
[facility]  PATIENT: Grant Valdez DOB: 1951-08-15   REASON FOR VISIT: Follow-up for multiple sclerosis, left facial pain HISTORY FROM: Patient    HISTORY OF PRESENT ILLNESS:UPDATE 8/27/2019CM Grant Valdez, 67 year old male returns for follow-up with history of multiple sclerosis and left facial pain.  He stopped his carbamazepine because he has been pain-free for about 6 months but his facial pain returned and he is back on 100 mg twice daily with good results.  In addition he is on tecfidera for multiple sclerosis.  No exacerbation since last seen, tolerating medication without side effects.  He has had 3 falls in the shower since last seen he claims it is due to the slope of the shower.  He was advised to get grab bars.  Last MRI of the brain 10/09/2016 was stable when compared to 2016.  He returns for reevaluation  UPDATE (07/17/17, VRP): Since last visit, doing well. Tolerating tecfidera. No alleviating or aggravating factors.   UPDATE 01/09/17: Since last visit, left facial pain improved. Tolerating CBZ. Now on CBZ 100mg  BID. No new MS symptoms.  UPDATE 10/04/16: Since last visit, was doing well until yesterday; now having new onset of left facial pain, intermittent, shooting electrical pain, from left lower jaw up to left eye. Triggered by brushing teeth, licking lip or touching. Multiple attacks per day. Tolerating tecfidera.   UPDATE 01/04/16: Since last visit, doing about the same. No new sxs. Tolerating tecfidera.   UPDATE 07/07/15: No new issues. Tolerating meds. Overall doing well.  UPDATE 01/05/15: Since last visit, overall stable. Still notes some balance difficulty. He is trying to increase his walking back up to 2 miles per day. Tolerating meds. No new issues.  UPDATE 07/14/14 (LL): Patient failed to follow up for a year, was supposed to follow up in 3 months after last visit. He did not want to do PT. He has been well on Tecfidera with only flushing  side effect. He presents today to have labs and get refills for his medication. Has fallen 2-3 times in the last year, no injuries. Complaints of bilateral finger numbness and paresthesias, slower cognitive processing time, fatigue. MMSE 28/30, AFT 18.  UPDATE 06/17/13: Patient presents for followup. Since last visit he has fallen 4 times including breaking his left foot, almost 4 months ago. Otherwise patient is stable. He still has issues with fatigue depression anxiety.  UPDATE 01/17/13: patient returns for urgent followup. Over past 3 weeks, patient has had increasing dizziness, balance difficulty, urination control problems. He feels some additional numbness or weakness in his legs. No difficulty with his hands or arms. No vision changes, slurred speech, trouble talking. Wife notes some more mood swings, irritability, depression, anxiety, attention and focus problems. He also has had more difficulty with hearing.  UPDATE 09/30/12: Since last visit, doing well. Started tecfidera on Jul 21, 2012. Initially flushing and GI sxs, but have eased up. No new MS flares or sxs.  UPDATE 06/19/12: S/p right CEA on 06/10/12. No complications. No new stroke/TIA symptoms. Will proceed with MS treatment.   UPDATE 05/01/12: Since last visit, had MRI brain, c and t spine, confirming chronic MS. On 04/08/12, had sudden onset dizziness, seeing floaters, and left face arm leg numbness x 10 minutes. Didn't tell anyone for 4 days. Had full recovery. Today carotid u/s shows right ICA stenosis (50-69%). Also with fatigue, malaise. Thinking about disability.  PRIOR HPI: 67 year old right-handed male with history of hypertension, hypercholesteremia, coronary artery disease, depression, anxiety, multiple  sclerosis, TIA, here for evaluation and management of MS. In 1989, patient developed numbness spots in the back. He was evaluated by Grant Valdez ([facility]), with MRI and lumbar puncture, and given a diagnosis of MS. We do not have  those records to review today. 2 years later he transferred care to [facility] who treated him with prednisone. Over these years she developed some staggering, balance difficulty, vision problems. In 1995 he was started on Betaseron. Over the next 10-15 years he was on and off of Betaseron, do to fluctuation in liver function testing. Patient last saw Grant Valdez in 2011. He saw a different neurologist at [facility] in 2012. Since that time he has not seen a neurologist. His last MRI was around 2000 102,011, which is apparently stable. He weaned himself off of Betaseron in 2011 because he felt that his symptoms weren't fairly mild.  In 2013 he is started to develop increasing fatigue, balance difficulty, numbness in his hands and feet. Also having trouble with coordination. REVIEW OF SYSTEMS: Full 14 system review of systems performed and notable only for those listed, all others are neg:  Constitutional: neg  Cardiovascular: neg Ear/Nose/Throat: neg  Skin: neg Eyes: Blurred vision Respiratory: neg Gastroitestinal: neg  Hematology/Lymphatic: neg  Endocrine: neg Musculoskeletal:neg Allergy/Immunology: neg Neurological: neg Psychiatric: neg Sleep : neg   ALLERGIES: No Known Allergies  HOME MEDICATIONS: Outpatient Medications Prior to Visit  Medication Sig Dispense Refill  . buPROPion (WELLBUTRIN XL) 300 MG 24 hr tablet Take 200 mg by mouth daily.     . carbamazepine (TEGRETOL XR) 100 MG 12 hr tablet Take 1-2 tablets (100-200 mg total) by mouth 2 (two) times daily. 120 tablet 12  . cholecalciferol (VITAMIN D) 1000 UNITS tablet Take 1,000 Units by mouth daily.    . Choline Fenofibrate (FENOFIBRIC ACID) 135 MG CPDR Take 1 capsule by mouth daily.   0  . citalopram (CELEXA) 20 MG tablet Take 20 mg by mouth daily.  0  . dipyridamole-aspirin (AGGRENOX) 200-25 MG 12hr capsule Take 1 capsule by mouth 2 (two) times daily.  0  . LORazepam (ATIVAN) 0.5 MG tablet Take 0.5 mg  by mouth 2 (two) times daily. nerves    . metoprolol (LOPRESSOR) 50 MG tablet Take 25 mg by mouth 2 (two) times daily.     . Multiple Vitamin (MULTIVITAMIN WITH MINERALS) TABS tablet Take 1 tablet by mouth daily.    . nitroGLYCERIN (NITROSTAT) 0.4 MG SL tablet Place 1 tablet (0.4 mg total) under the tongue every 5 (five) minutes as needed for chest pain. 25 tablet 3  . Omega-3 Fatty Acids (FISH OIL) 1000 MG CAPS Take 1 capsule by mouth 2 (two) times daily.     Marland Kitchen omeprazole (PRILOSEC) 20 MG capsule Take 20 mg by mouth daily.      . pravastatin (PRAVACHOL) 40 MG tablet Take 1 tablet by mouth daily.    . TECFIDERA 240 MG CPDR TAKE 1 CAPSULE (240 MG TOTAL) BY MOUTH 2 TIMES DAILY 180 capsule 1  . nitroGLYCERIN (NITROSTAT) 0.4 MG SL tablet Place 1 tablet (0.4 mg total) under the tongue every 5 (five) minutes as needed for chest pain. 25 tablet 3   No facility-administered medications prior to visit.     PAST MEDICAL HISTORY: Past Medical History:  Diagnosis Date  . Anxiety   . CAD (coronary artery disease)    had 2 arteries tented four years ago sees Dr. Dannielle Burn  . Carotid artery occlusion   .  Depression   . Diabetes mellitus without complication ([place])   . GERD (gastroesophageal reflux disease)   . Headache(784.0)    migraines hx of  . HLD (hyperlipidemia)   . HTN (hypertension)    Sees Dr. Woody Seller  . MS (multiple sclerosis) ([place])    diagnosed in 1989. He was treated with prednisone followed by betaseron which was gradually tapered because his LFTs went up and it resolved off therapy. Likely he is remained in remission for years  . Neuromuscular disorder [facility])    has Multiple Sclerosis, sees Dr. Leta Baptist  . S/P right inguinal herniorrhaphy   . Stroke [facility])    "patient states has had several TIA's, last one 05/27/12"  . TIA (transient ischemic attack)     PAST SURGICAL HISTORY: Past Surgical History:  Procedure Laterality Date  . CAROTID ENDARTERECTOMY    . COLONOSCOPY  March 12, 2006     Had 2 polyps removed; one was an adenoma and the other one was hyperplastic  . COLONOSCOPY N/A 10/09/2012   Procedure: COLONOSCOPY;  Surgeon: Rogene Houston, MD;  [facility];  Service: Endoscopy;  Laterality: N/A;  1030-rescheduled to 12:00pm per [name]  . ENDARTERECTOMY  06/10/2012   Procedure: ENDARTERECTOMY CAROTID;  Surgeon: Rosetta Posner, MD;  Location: [place];  Service: Vascular;  Laterality: Right;  With Dacron Patch Angioplasty  . EYE SURGERY Right 10/2015   cataract  . HERNIA REPAIR  age 78  . HYDROCELE EXCISION  2011  . stents    . varicose removal from scrotum  1982  . VASECTOMY  1989    FAMILY HISTORY: Family History  Problem Relation Age of Onset  . Diabetes Mother   . Hypertension Mother   . Heart disease Mother        Before age 52  . Hyperlipidemia Mother   . Heart attack Father   . Lung cancer Father   . Cancer Father        Lung  . Heart disease Father        Before age 61  . Hypertension Son     SOCIAL HISTORY: Social History   Socioeconomic History  . Marital status: Married    Spouse name: Odysseus Cada  . Number of children: 2  . Years of education: 4yrs  . Highest education level: Not on file  Occupational History    Employer: [employer] [place]  Social Needs  . Financial resource strain: Not on file  . Food insecurity:    Worry: Not on file    Inability: Not on file  . Transportation needs:    Medical: Not on file    Non-medical: Not on file  Tobacco Use  . Smoking status: Former Smoker    Packs/day: 2.00    Years: 20.00    Pack years: 40.00    Types: Cigarettes    Start date: 05/23/1968    Last attempt to quit: 08/21/1993    Years since quitting: 24.6  . Smokeless tobacco: Never Used  Substance and Sexual Activity  . Alcohol use: No    Alcohol/week: 0.0 standard drinks  . Drug use: No  . Sexual activity: Not on file  Lifestyle  . Physical activity:    Days per week: Not on file    Minutes  per session: Not on file  . Stress: Not on file  Relationships  . Social connections:    Talks on phone: Not on file  Gets together: Not on file    Attends religious service: Not on file    Active member of club or organization: Not on file    Attends meetings of clubs or organizations: Not on file    Relationship status: Not on file  . Intimate partner violence:    Fear of current or ex partner: Not on file    Emotionally abused: Not on file    Physically abused: Not on file    Forced sexual activity: Not on file  Other Topics Concern  . Not on file  Social History Narrative   Full time. Married. Works at [employer].    Pt lives at home with his spouse.   Caffeine Use: none     PHYSICAL EXAM  Vitals:   04/16/18 1322  BP: (!) 149/83  Pulse: 60  Weight: 240 lb (108.9 kg)  Height: 6' (1.829 m)   Body mass index is 32.55 kg/m.  Generalized: Well developed, in no acute distress  Head: normocephalic and atraumatic,. Oropharynx benign  Neck: Supple,  Musculoskeletal: No deformity   Neurological examination   Mentation: Alert oriented to time, place, history taking. Attention span and concentration appropriate. Recent and remote memory intact.  Follows all commands speech and language fluent.   Cranial nerve II-XII: Fundoscopic exam reveals sharp disc margins.Pupils were equal round reactive to light extraocular movements were full, visual field were full on confrontational test. Facial sensation and strength were normal. hearing was intact to finger rubbing bilaterally. Uvula tongue midline. head turning and shoulder shrug were normal and symmetric.Tongue protrusion into cheek strength was normal. Motor: normal bulk and tone, full strength in the BUE, BLE, fine finger movements normal, no pronator drift. No focal weakness Sensory: normal and symmetric to light touch, , and  Vibration, decreased pinprick to both lower extremities   Coordination: finger-nose-finger,  heel-to-shin bilaterally, no dysmetria, mild intention tremor left hand Reflexes: Brachioradialis 2/2, biceps 2/2, triceps 2/2, patellar 2/2, Achilles 2/2, plantar responses were flexor bilaterally. Gait and Station: Rising up from seated position without assistance, normal stance,  moderate stride, good arm swing, smooth turning, able to perform tiptoe, and heel walking without difficulty. Tandem gait is mildly unsteady  DIAGNOSTIC DATA (LABS, IMAGING, TESTING) - I reviewed patient records, labs, notes, testing and imaging myself where available.  Lab Results  Component Value Date   WBC 4.3 01/04/2016   HGB 15.2 01/04/2016   HCT 43.5 01/04/2016   MCV 95 01/04/2016   PLT 194 01/04/2016      Component Value Date/Time   NA 138 01/04/2016 1405   K 4.9 01/04/2016 1405   CL 99 01/04/2016 1405   CO2 21 01/04/2016 1405   GLUCOSE 83 01/04/2016 1405   GLUCOSE 93 07/03/2015 1641   BUN 14 01/04/2016 1405   CREATININE 1.10 10/09/2016 1314   CALCIUM 9.5 01/04/2016 1405   PROT 7.2 01/04/2016 1405   ALBUMIN 4.7 01/04/2016 1405   AST 57 (H) 01/04/2016 1405   ALT 58 (H) 01/04/2016 1405   ALKPHOS 33 (L) 01/04/2016 1405   BILITOT 0.6 01/04/2016 1405   GFRNONAA 70 01/04/2016 1405   GFRAA 81 01/04/2016 1405    Lab Results  Component Value Date   HGBA1C 5.4 01/17/2013   Lab Results  Component Value Date   VITAMINB12 302 07/14/2014   Lab Results  Component Value Date   TSH 2.320 01/17/2013      ASSESSMENT AND PLAN   67 y.o. year old male here with  multiple sclerosis and prior TIA and left facial pain.  His MS was diagnosed in 1989 initially on Betaseron now on tecfidera.  TIA due to hypertension hypercholesterolemia occurred 04/08/2012.  Left facial  pain in good control at present.      PLAN: Continue tecfidera for multiple sclerosis/relapsing remitting We will check labs today for adverse effects of tecfidera Continue carbamazepine 100 mg 1 tablets 2 times daily Will  check level of CBZ Continue moderate exercise Continue stroke prevention Aggrenox statin blood pressure control followed by primary care Dr. Woody Seller Follow-up yearly Dennie Bible, [facility], [facility], [facility] [address], [place],  [address] [phone]

## 2018-04-16 ENCOUNTER — Ambulatory Visit: Payer: Medicare Other | Admitting: Nurse Practitioner

## 2018-04-16 ENCOUNTER — Encounter: Payer: Self-pay | Admitting: Nurse Practitioner

## 2018-04-16 VITALS — BP 149/83 | HR 60 | Ht 72.0 in | Wt 240.0 lb

## 2018-04-16 DIAGNOSIS — Z5181 Encounter for therapeutic drug level monitoring: Secondary | ICD-10-CM

## 2018-04-16 DIAGNOSIS — R519 Headache, unspecified: Secondary | ICD-10-CM | POA: Insufficient documentation

## 2018-04-16 DIAGNOSIS — R51 Headache: Secondary | ICD-10-CM

## 2018-04-16 DIAGNOSIS — G35 Multiple sclerosis: Secondary | ICD-10-CM

## 2018-04-16 NOTE — Patient Instructions (Signed)
Continue tecfidera for multiple sclerosis We will check labs today for adverse effects of tecfidera Continue carbamazepine 100 mg 1 tablets 2 times daily Will check level of CBZ Follow-up yearly

## 2018-04-17 ENCOUNTER — Telehealth: Payer: Self-pay | Admitting: *Deleted

## 2018-04-17 LAB — CBC WITH DIFFERENTIAL/PLATELET
BASOS ABS: 0 10*3/uL (ref 0.0–0.2)
BASOS: 1 %
EOS (ABSOLUTE): 0.1 10*3/uL (ref 0.0–0.4)
Eos: 3 %
Hematocrit: 44.2 % (ref 37.5–51.0)
Hemoglobin: 15.5 g/dL (ref 13.0–17.7)
Immature Grans (Abs): 0 10*3/uL (ref 0.0–0.1)
Immature Granulocytes: 1 %
Lymphocytes Absolute: 0.9 10*3/uL (ref 0.7–3.1)
Lymphs: 18 %
MCH: 32.7 pg (ref 26.6–33.0)
MCHC: 35.1 g/dL (ref 31.5–35.7)
MCV: 93 fL (ref 79–97)
MONOS ABS: 0.7 10*3/uL (ref 0.1–0.9)
Monocytes: 14 %
NEUTROS ABS: 3.1 10*3/uL (ref 1.4–7.0)
Neutrophils: 63 %
PLATELETS: 165 10*3/uL (ref 150–450)
RBC: 4.74 x10E6/uL (ref 4.14–5.80)
RDW: 13 % (ref 12.3–15.4)
WBC: 4.9 10*3/uL (ref 3.4–10.8)

## 2018-04-17 LAB — COMPREHENSIVE METABOLIC PANEL
A/G RATIO: 1.8 (ref 1.2–2.2)
ALT: 47 IU/L — ABNORMAL HIGH (ref 0–44)
AST: 57 IU/L — ABNORMAL HIGH (ref 0–40)
Albumin: 4.8 g/dL (ref 3.6–4.8)
Alkaline Phosphatase: 37 IU/L — ABNORMAL LOW (ref 39–117)
BILIRUBIN TOTAL: 0.5 mg/dL (ref 0.0–1.2)
BUN/Creatinine Ratio: 12 (ref 10–24)
BUN: 14 mg/dL (ref 8–27)
CALCIUM: 9.9 mg/dL (ref 8.6–10.2)
CHLORIDE: 101 mmol/L (ref 96–106)
CO2: 21 mmol/L (ref 20–29)
Creatinine, Ser: 1.16 mg/dL (ref 0.76–1.27)
GFR, EST AFRICAN AMERICAN: 77 mL/min/{1.73_m2} (ref >59)
GFR, EST NON AFRICAN AMERICAN: 66 mL/min/{1.73_m2} (ref >59)
GLOBULIN, TOTAL: 2.6 g/dL (ref 1.5–4.5)
Glucose: 84 mg/dL (ref 65–99)
POTASSIUM: 4.6 mmol/L (ref 3.5–5.2)
SODIUM: 137 mmol/L (ref 134–144)
TOTAL PROTEIN: 7.4 g/dL (ref 6.0–8.5)

## 2018-04-17 LAB — CARBAMAZEPINE LEVEL, TOTAL: Carbamazepine (Tegretol), S: 3.1 ug/mL — ABNORMAL LOW (ref 4.0–12.0)

## 2018-04-17 NOTE — Telephone Encounter (Signed)
LVM informing patient that his labs are stable. Left number for any questions.

## 2018-05-09 ENCOUNTER — Ambulatory Visit: Payer: Medicare Other | Admitting: Family

## 2018-05-09 ENCOUNTER — Encounter (HOSPITAL_COMMUNITY): Payer: Medicare Other

## 2018-05-16 ENCOUNTER — Encounter: Payer: Self-pay | Admitting: Family

## 2018-05-16 ENCOUNTER — Ambulatory Visit: Payer: Medicare Other | Admitting: Family

## 2018-05-16 ENCOUNTER — Ambulatory Visit (HOSPITAL_COMMUNITY)
Admission: RE | Admit: 2018-05-16 | Discharge: 2018-05-16 | Disposition: A | Discharge status: Home / Self Care | Payer: Medicare Other | Source: Ambulatory Visit | Attending: Family | Admitting: Family

## 2018-05-16 ENCOUNTER — Other Ambulatory Visit: Payer: Self-pay

## 2018-05-16 VITALS — BP 144/89 | HR 61 | Temp 97.6°F | Resp 20 | Ht 72.0 in | Wt 237.0 lb

## 2018-05-16 DIAGNOSIS — Z9889 Other specified postprocedural states: Secondary | ICD-10-CM | POA: Insufficient documentation

## 2018-05-16 DIAGNOSIS — I6523 Occlusion and stenosis of bilateral carotid arteries: Secondary | ICD-10-CM | POA: Diagnosis not present

## 2018-05-16 DIAGNOSIS — I6521 Occlusion and stenosis of right carotid artery: Secondary | ICD-10-CM | POA: Diagnosis not present

## 2018-05-16 NOTE — Progress Notes (Signed)
Chief Complaint: Follow up Extracranial Carotid Artery Stenosis   History of Present Illness  Grant Valdez is a 67 y.o. male who is s/p right carotid endarterectomy in October of 2013 by Dr. Donnetta Hutching. He returns today for follow up.  He has had progression of MS and has had to go out on disability. He feels his MS is stable at this point.  He had weaned himself off of Tegretol, he then developed recurrence of left facial trigeminal neuralgia, is back on the Tegretol and the occurrence of pain is less often.   He had two TIA's several years before the CEA, about 2 weeks apart; both manifested as expressive aphasia, denies hemiparesis, denies monocular loss of vision, denies unilateral facial drooping. He denies any subsequent TIA's or stroke.   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    Diabetic: No Tobacco use: former smoker, quit in 1995  Pt meds include: Statin : Yes ASA: Yes Other anticoagulants/antiplatelets: no   Past Medical History:  Diagnosis Date  . Anxiety   . CAD (coronary artery disease)    had 2 arteries tented four years ago sees Dr. Dannielle Burn  . Carotid artery occlusion   . Depression   . Diabetes mellitus without complication ([place])   . GERD (gastroesophageal reflux disease)   . Headache(784.0)    migraines hx of  . HLD (hyperlipidemia)   . HTN (hypertension)    Sees Dr. Woody Seller  . MS (multiple sclerosis) ([place])    diagnosed in 1989. He was treated with prednisone followed by betaseron which was gradually tapered because his LFTs went up and it resolved off therapy. Likely he is remained in remission for years  . Neuromuscular disorder [facility])    has Multiple Sclerosis, sees Dr. Leta Baptist  . S/P right inguinal herniorrhaphy   . Stroke [facility])    "patient states has had several TIA's, last one 05/27/12"  . TIA (transient ischemic attack)   . Trigeminal  neuralgia     Social History Social History   Tobacco Use  . Smoking status: Former Smoker    Packs/day: 2.00    Years: 20.00    Pack years: 40.00    Types: Cigarettes    Start date: 05/23/1968    Last attempt to quit: 08/21/1993    Years since quitting: 24.7  . Smokeless tobacco: Never Used  Substance Use Topics  . Alcohol use: No    Alcohol/week: 0.0 standard drinks  . Drug use: No    Family History Family History  Problem Relation Age of Onset  . Diabetes Mother   . Hypertension Mother   . Heart disease Mother        Before age 15  . Hyperlipidemia Mother   . Heart attack Father   . Lung cancer Father   . Cancer Father        Lung  . Heart disease Father        Before age 57  . Hypertension Son     Surgical History Past Surgical History:  Procedure Laterality Date  . CAROTID ENDARTERECTOMY    . COLONOSCOPY  March 12, 2006   Had 2 polyps removed; one was an adenoma and the other one was hyperplastic  . COLONOSCOPY N/A 10/09/2012   Procedure: COLONOSCOPY;  Surgeon: Rogene Houston, MD;  [facility];  Service: Endoscopy;  Laterality: N/A;  1030-rescheduled to 12:00pm per [name]  . ENDARTERECTOMY  06/10/2012   Procedure: ENDARTERECTOMY CAROTID;  Surgeon: Rosetta Posner, MD;  Location: [place];  Service: Vascular;  Laterality: Right;  With Dacron Patch Angioplasty  . EYE SURGERY Right 10/2015   cataract  . HERNIA REPAIR  age 57  . HYDROCELE EXCISION  2011  . stents    . varicose removal from scrotum  1982  . VASECTOMY  1989    No Known Allergies  Current Outpatient Medications  Medication Sig Dispense Refill  . buPROPion (WELLBUTRIN XL) 300 MG 24 hr tablet Take 200 mg by mouth daily.     . carbamazepine (TEGRETOL XR) 100 MG 12 hr tablet Take 1-2 tablets (100-200 mg total) by mouth 2 (two) times daily. 120 tablet 12  . cholecalciferol (VITAMIN D) 1000 UNITS tablet Take 1,000 Units by mouth daily.    . Choline Fenofibrate (FENOFIBRIC ACID) 135 MG CPDR Take  1 capsule by mouth daily.   0  . citalopram (CELEXA) 20 MG tablet Take 20 mg by mouth daily.  0  . dipyridamole-aspirin (AGGRENOX) 200-25 MG 12hr capsule Take 1 capsule by mouth 2 (two) times daily.  0  . LORazepam (ATIVAN) 0.5 MG tablet Take 0.5 mg by mouth 2 (two) times daily. nerves    . metoprolol (LOPRESSOR) 50 MG tablet Take 25 mg by mouth 2 (two) times daily.     . Multiple Vitamin (MULTIVITAMIN WITH MINERALS) TABS tablet Take 1 tablet by mouth daily.    . nitroGLYCERIN (NITROSTAT) 0.4 MG SL tablet Place 1 tablet (0.4 mg total) under the tongue every 5 (five) minutes as needed for chest pain. 25 tablet 3  . Omega-3 Fatty Acids (FISH OIL) 1000 MG CAPS Take 1 capsule by mouth 2 (two) times daily.     Marland Kitchen omeprazole (PRILOSEC) 20 MG capsule Take 20 mg by mouth daily.      . pravastatin (PRAVACHOL) 40 MG tablet Take 1 tablet by mouth daily.    . TECFIDERA 240 MG CPDR TAKE 1 CAPSULE (240 MG TOTAL) BY MOUTH 2 TIMES DAILY 180 capsule 1  . nitroGLYCERIN (NITROSTAT) 0.4 MG SL tablet Place 1 tablet (0.4 mg total) under the tongue every 5 (five) minutes as needed for chest pain. 25 tablet 3   No current facility-administered medications for this visit.     Review of Systems : See HPI for pertinent positives and negatives.  Physical Examination  Vitals:   05/16/18 1557 05/16/18 1558  BP: (!) 149/91 (!) 144/89  Pulse: 61   Resp: 20   Temp: 97.6 F (36.4 C)   TempSrc: Oral   SpO2: 97%   Weight: 237 lb (107.5 kg)   Height: 6' (1.829 m)    Body mass index is 32.14 kg/m.  General: WDWN obese male in NAD GAIT:normal Eyes: PERRLA Pulmonary: Respirations are non-labored, CTAB, no rales, rhonchi, or wheezing. Cardiac: regular rhythm,no detected murmur.  VASCULAR EXAM Carotid Bruits Right Left   Negative Negative    Abdominal aortic pulse is not palpable. Radial pulses are 2+ palpable and equal.       LE Pulses Right Left   POPLITEAL not palpable  not palpable   POSTERIOR TIBIAL  not palpable   not palpable    DORSALIS PEDIS  ANTERIOR TIBIAL palpable  palpable     Gastrointestinal: soft, nontender, BS WNL, no r/g,no palpated masses other than sclerotic areas that were beta seron injection sites.  Musculoskeletal: No muscle atrophy/wasting. M/S  5/5 throughout, Extremities without ischemic changes. Skin: No rashes, no ulcers, no cellulitis.   Neurologic:  A&O X 3; appropriate affect, sensation is normal; speech is normal, CN 2-12 intact, pain and light touch intact in extremities, motor exam as listed above. Psychiatric: Normal thought content, mood appropriate to clinical situation.    Assessment: Grant Valdez is a 67 y.o. male whois s/p right carotid endarterectomy in October of 2013. He had two TIA's several years before the CEA, about 2 weeks apart; both manifested as expressive aphasia. He denies any subsequent TIA's or stroke.   Fortunately he does not have DM and he quit smoking in the 1990's.    DATA  Carotid Duplex (05-16-18): Right ICA: CEA site with no stenosis, mild hyperplasia Left ICA: 1-39% stenosis Bilateral vertebral artery flow is antegrade.  Right subclavian artery waveforms were disturbed, left with normal flow dynamics.  No significant change compared to the exams on 08-02-15 and 09-19-16.    Plan:  Follow-up in 18 months with Carotid Duplex scan.   I discussed in depth with the patient the nature of atherosclerosis, and emphasized the importance of maximal medical management including strict control of blood pressure, blood glucose, and lipid levels, obtaining regular exercise, and continued cessation of smoking.  The patient is aware that without maximal medical management the underlying atherosclerotic  disease process will progress, limiting the benefit of any interventions. The patient was given information about stroke prevention and what symptoms should prompt the patient to seek immediate medical care. Thank you for allowing [place] to participate in this patient's care.  Clemon Chambers, RN, MSN, FNP-C [facility] Office: [phone]  Clinic Physician: Oneida Alar  05/16/18 4:14 PM

## 2018-05-16 NOTE — Patient Instructions (Signed)

## 2018-05-27 IMAGING — NM NM MYOCAR MULTI W/SPECT W/WALL MOTION & EF
2 series · 12 of 12 positions shown · non-contrast
Comparison: none

[Series 1: rest · 6.51mm/px · 6 of 64 frames shown]
[frame 6/64]
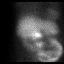
[frame 16/64]
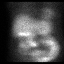
[frame 27/64]
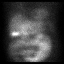
[frame 38/64]
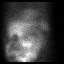
[frame 48/64]
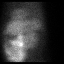
[frame 59/64]
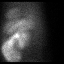

[Series 3: stress gated - perfusion · 6.51mm/px · 6 of 64 frames shown]
[frame 6/64]
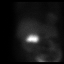
[frame 16/64]
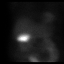
[frame 27/64]
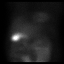
[frame 38/64]
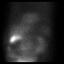
[frame 48/64]
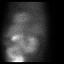
[frame 59/64]
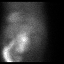

[12 of 12 positions shown; findings below may reference images not displayed]

Canned report from images found in remote index.

Refer to host system for actual result text.

## 2018-05-28 ENCOUNTER — Other Ambulatory Visit: Payer: Self-pay | Admitting: *Deleted

## 2018-05-28 NOTE — Telephone Encounter (Signed)
Received fax from [facility] re: refill of tecfidera. Called [facility], spoke with Louis and inquired about tecfidera refill request. This RN advised him it was refilled in July x 6 months to [facility]. He stated the refill request is automatically generated, however if the patient is getting medication for [place] the fax can be disregarded.  LVM for patient requesting he call back to verify he is getting medication without difficulty from [facility].

## 2018-05-30 ENCOUNTER — Other Ambulatory Visit: Payer: Self-pay | Admitting: *Deleted

## 2018-05-30 MED ORDER — DIMETHYL FUMARATE 240 MG PO CPDR: DELAYED_RELEASE_CAPSULE | ORAL | 3 refills | Status: DC

## 2018-05-31 NOTE — Telephone Encounter (Signed)
Spoke to pt and since last 2-3 wks ago has had L trig neuralgia pain,  Is taking now  CBZ 200mg  po bid.  It is not really touching the pain that he is experiencing.  Increase or something else?

## 2018-05-31 NOTE — Telephone Encounter (Signed)
LMVM for pt that can take 400mg  po BID (this would be 4 caps po BID).  See how he does and can refill if needed.

## 2018-05-31 NOTE — Telephone Encounter (Signed)
Increase to CBZ 400mg  twice a day. -VRP

## 2018-05-31 NOTE — Telephone Encounter (Signed)
Pts wife(Anna) called stating that the pt is getting medication fine, with no problems. Vicente Males requesting a call to discuss the pts increase of TN over the past 2-3 weeks.

## 2018-06-03 NOTE — Telephone Encounter (Signed)
Pts wife(Anna) called stating the pharmacy will need a new rx with the increased dosing for medication carbamazepine (TEGRETOL XR) 100 MG 12 hr tablet requesting a call once new rx has been sent

## 2018-06-05 MED ORDER — CARBAMAZEPINE ER 100 MG PO TB12: 400.0000 mg | ORAL_TABLET | Freq: Two times a day (BID) | ORAL | 5 refills | Status: DC

## 2018-06-05 NOTE — Telephone Encounter (Signed)
Per Dr Leta Baptist, new Rx sent in for carbamazepine XR 100 mg tab, take 4 tabs twice daily. Disp #240, 5 refills to [facility].

## 2018-06-05 NOTE — Telephone Encounter (Signed)
Pt wife(on DPR-Leffler,Anna L) has called back to inform RN Stanton Kidney C that the prescription has yet to be called into the [facility], [place] [facility] [place] [phone] (Phone) [phone] (Fax)   Wife states pt will run out today please call

## 2018-06-05 NOTE — Addendum Note (Signed)
Addended by: Minna Antis on: 06/05/2018 02:55 PM   Modules accepted: Orders

## 2018-06-06 ENCOUNTER — Telehealth: Payer: Self-pay | Admitting: *Deleted

## 2018-06-06 NOTE — Telephone Encounter (Signed)
LVM informing patient this RN received a fax from [facility] stating they have been unable to get in touch with him re: Tecfidera. Advised him they will put his prescription on hold if they don't hear back from him within a week of sending him a letter this week. Requested he call back as this RN wants to be certain he is getting his Tecfidera without issues. Left number.

## 2018-06-27 NOTE — Telephone Encounter (Signed)
Received fax from [facility] that patient's prescription for Tecfidera has been received and is being processed.

## 2018-10-14 ENCOUNTER — Telehealth: Payer: Self-pay | Admitting: Diagnostic Neuroimaging

## 2018-10-14 NOTE — Telephone Encounter (Signed)
Patient should have tecfidera refills on file. London Sheer, spoke with [place] who stated patient last got refill on Jul 31, 2018  x 3 months and has 1 more refill on file. He is receiving Tecfidera through [facility]. She stated she would go ahead and put in referral for future refills.  He will have to call and schedule shipment when he needs refill again.  Called patient and advised him of above. He stated he thought he only had 1 week left; I advised he call today to schedule shipment. He has Acaria's number,  verbalized understanding, appreciation.

## 2018-10-14 NOTE — Telephone Encounter (Signed)
Pt is needing a refill for his Dimethyl Fumarate (TECFIDERA) 240 MG CPDR sent to [facility] and he needs a 3 months supply.

## 2018-11-04 ENCOUNTER — Other Ambulatory Visit: Payer: Self-pay | Admitting: Diagnostic Neuroimaging

## 2018-11-05 NOTE — Telephone Encounter (Signed)
Per Dr Leta Baptist called patient and advised him to take tegretol XR 500 mg twice daily. Advised a new Rx will be sent today. Patient verbalized understanding, appreciation. New Rx per Dr Leta Baptist e scribed to [place].

## 2018-11-05 NOTE — Telephone Encounter (Signed)
Called patient and discussed tegretol. He stated he is taking 400 mg two x daily. His facial pain has improved, but he still has pain 3-4 x daily, sometimes worse than others. He asked if the dose can be increased or if Dr Leta Baptist has any other recommendations. He reported he just began taking CBD oil 167 mg daily. I advised will discuss with Dr Stark Klein and call him later this afternoon. Patient verbalized understanding, appreciation.

## 2018-12-21 ENCOUNTER — Other Ambulatory Visit: Payer: Self-pay | Admitting: Diagnostic Neuroimaging

## 2018-12-23 NOTE — Telephone Encounter (Signed)
Carbamazepine refilled as requested per note dated 11/05/18. [facility].

## 2019-03-17 ENCOUNTER — Other Ambulatory Visit: Payer: Self-pay | Admitting: Diagnostic Neuroimaging

## 2019-04-21 ENCOUNTER — Encounter: Payer: Self-pay | Admitting: Diagnostic Neuroimaging

## 2019-04-21 ENCOUNTER — Other Ambulatory Visit: Payer: Self-pay

## 2019-04-21 ENCOUNTER — Ambulatory Visit (INDEPENDENT_AMBULATORY_CARE_PROVIDER_SITE_OTHER): Payer: Medicare Other | Admitting: Diagnostic Neuroimaging

## 2019-04-21 VITALS — BP 161/93 | HR 58 | Temp 96.9°F | Ht 72.0 in | Wt 241.6 lb

## 2019-04-21 DIAGNOSIS — R51 Headache: Secondary | ICD-10-CM | POA: Diagnosis not present

## 2019-04-21 DIAGNOSIS — G35 Multiple sclerosis: Secondary | ICD-10-CM

## 2019-04-21 DIAGNOSIS — R519 Headache, unspecified: Secondary | ICD-10-CM

## 2019-04-21 DIAGNOSIS — Z5181 Encounter for therapeutic drug level monitoring: Secondary | ICD-10-CM

## 2019-04-21 MED ORDER — CARBAMAZEPINE ER 100 MG PO TB12: ORAL_TABLET | ORAL | 12 refills | Status: DC

## 2019-04-21 NOTE — Progress Notes (Signed)
[facility]  PATIENT: Grant Valdez DOB: 10-05-1950  REFERRING CLINICIAN:  HISTORY FROM: patient  REASON FOR VISIT: follow up   HISTORICAL  CHIEF COMPLAINT:  Chief Complaint  Patient presents with  . Multiple Sclerosis    rm 7 FU,  "no new concerns re: MS"    HISTORY OF PRESENT ILLNESS:   UPDATE (04/21/19, VRP): Since last visit, doing fair. TN sxs stable on CBZ 500mg  twice a day. Tecfidera stable. Symptoms are stable. No alleviating or aggravating factors.    UPDATE (07/17/17, VRP): Since last visit, doing well. Tolerating tecfidera. No alleviating or aggravating factors.   UPDATE 01/09/17: Since last visit, left facial pain improved. Tolerating CBZ. Now on CBZ 100mg  BID. No new MS symptoms.  UPDATE 10/04/16: Since last visit, was doing well until yesterday; now having new onset of left facial pain, intermittent, shooting electrical pain, from left lower jaw up to left eye. Triggered by brushing teeth, licking lip or touching. Multiple attacks per day. Tolerating tecfidera.   UPDATE 01/04/16: Since last visit, doing about the same. No new sxs. Tolerating tecfidera.   UPDATE 07/07/15: No new issues. Tolerating meds. Overall doing well.  UPDATE 01/05/15: Since last visit, overall stable. Still notes some balance difficulty. He is trying to increase his walking back up to 2 miles per day. Tolerating meds. No new issues.  UPDATE 07/14/14 (LL): Patient failed to follow up for a year, was supposed to follow up in 3 months after last visit. He did not want to do PT. He has been well on Tecfidera with only flushing side effect. He presents today to have labs and get refills for his medication. Has fallen 2-3 times in the last year, no injuries. Complaints of bilateral finger numbness and paresthesias, slower cognitive processing time, fatigue. MMSE 28/30, AFT 18.  UPDATE 06/17/13: Patient presents for followup. Since last visit he has fallen 4 times including  breaking his left foot, almost 4 months ago. Otherwise patient is stable. He still has issues with fatigue depression anxiety.  UPDATE 01/17/13: patient returns for urgent followup. Over past 3 weeks, patient has had increasing dizziness, balance difficulty, urination control problems. He feels some additional numbness or weakness in his legs. No difficulty with his hands or arms. No vision changes, slurred speech, trouble talking. Wife notes some more mood swings, irritability, depression, anxiety, attention and focus problems. He also has had more difficulty with hearing.  UPDATE 09/30/12: Since last visit, doing well. Started tecfidera on Jul 21, 2012. Initially flushing and GI sxs, but have eased up. No new MS flares or sxs.  UPDATE 06/19/12: S/p right CEA on 06/10/12. No complications. No new stroke/TIA symptoms. Will proceed with MS treatment.   UPDATE 05/01/12: Since last visit, had MRI brain, c and t spine, confirming chronic MS. On 04/08/12, had sudden onset dizziness, seeing floaters, and left face arm leg numbness x 10 minutes. Didn't tell anyone for 4 days. Had full recovery. Today carotid u/s shows right ICA stenosis (50-69%). Also with fatigue, malaise. Thinking about disability.  PRIOR HPI: 68 year old right-handed male with history of hypertension, hypercholesteremia, coronary artery disease, depression, anxiety, multiple sclerosis, TIA, here for evaluation and management of MS. In 1989, patient developed numbness spots in the back. He was evaluated by Dr. Rosiland Oz ([place]), with MRI and lumbar puncture, and given a diagnosis of MS. We do not have those records to review today. 2 years later he transferred care to Dr. Aggie Hacker [facility] who treated him with  prednisone. Over these years she developed some staggering, balance difficulty, vision problems. In 1995 he was started on Betaseron. Over the next 10-15 years he was on and off of Betaseron, do to fluctuation in liver function testing.  Patient last saw Dr. Aggie Hacker in 2011. He saw a different neurologist at [facility] in 2012. Since that time he has not seen a neurologist. His last MRI was around 2000 102,011, which is apparently stable. He weaned himself off of Betaseron in 2011 because he felt that his symptoms weren't fairly mild.  In 2013 he is started to develop increasing fatigue, balance difficulty, numbness in his hands and feet. Also having trouble with coordination.  REVIEW OF SYSTEMS: Full 14 system review of systems performed and negative except: as per HPI.    ALLERGIES: No Known Allergies  HOME MEDICATIONS: Outpatient Medications Prior to Visit  Medication Sig Dispense Refill  . buPROPion (WELLBUTRIN XL) 300 MG 24 hr tablet Take 200 mg by mouth daily.     . carbamazepine (TEGRETOL XR) 100 MG 12 hr tablet TAKE 5 TABLETS TWICE A DAY. 300 tablet 2  . cholecalciferol (VITAMIN D) 1000 UNITS tablet Take 1,000 Units by mouth daily.    . Choline Fenofibrate (FENOFIBRIC ACID) 135 MG CPDR Take 1 capsule by mouth daily.   0  . citalopram (CELEXA) 20 MG tablet Take 20 mg by mouth daily.  0  . Dimethyl Fumarate (TECFIDERA) 240 MG CPDR TAKE 1 CAPSULE (240 MG TOTAL) BY MOUTH 2 TIMES DAILY 180 capsule 3  . dipyridamole-aspirin (AGGRENOX) 200-25 MG 12hr capsule Take 1 capsule by mouth 2 (two) times daily.  0  . LORazepam (ATIVAN) 0.5 MG tablet Take 0.5 mg by mouth 2 (two) times daily. nerves    . metoprolol (LOPRESSOR) 50 MG tablet Take 25 mg by mouth 2 (two) times daily.     . Multiple Vitamin (MULTIVITAMIN WITH MINERALS) TABS tablet Take 1 tablet by mouth daily.    . nitroGLYCERIN (NITROSTAT) 0.4 MG SL tablet Place 1 tablet (0.4 mg total) under the tongue every 5 (five) minutes as needed for chest pain. 25 tablet 3  . Omega-3 Fatty Acids (FISH OIL) 1000 MG CAPS Take 1 capsule by mouth 2 (two) times daily.     Marland Kitchen omeprazole (PRILOSEC) 20 MG capsule Take 20 mg by mouth daily.      . rosuvastatin (CRESTOR) 20 MG tablet  TAKE 1 TABLET BY MOUTH ONCE DAILY (STOP PRAVASTATIN)    . UNABLE TO FIND Med Name: CBD oil 167 mg, daily    . nitroGLYCERIN (NITROSTAT) 0.4 MG SL tablet Place 1 tablet (0.4 mg total) under the tongue every 5 (five) minutes as needed for chest pain. 25 tablet 3  . pravastatin (PRAVACHOL) 40 MG tablet Take 1 tablet by mouth daily.     No facility-administered medications prior to visit.     PAST MEDICAL HISTORY: Past Medical History:  Diagnosis Date  . Anxiety   . CAD (coronary artery disease)    had 2 arteries tented four years ago sees Dr. Dannielle Burn  . Carotid artery occlusion   . Depression   . Diabetes mellitus without complication ([place])   . GERD (gastroesophageal reflux disease)   . Headache(784.0)    migraines hx of  . HLD (hyperlipidemia)   . HTN (hypertension)    Sees Dr. Woody Seller  . MS (multiple sclerosis) ([place])    diagnosed in 1989. He was treated with prednisone followed by betaseron which was gradually tapered because his  LFTs went up and it resolved off therapy. Likely he is remained in remission for years  . Neuromuscular disorder [facility])    has Multiple Sclerosis, sees Dr. Leta Baptist  . S/P right inguinal herniorrhaphy   . Stroke [facility])    "patient states has had several TIA's, last one 05/27/12"  . TIA (transient ischemic attack)   . Trigeminal neuralgia     PAST SURGICAL HISTORY: Past Surgical History:  Procedure Laterality Date  . CAROTID ENDARTERECTOMY    . COLONOSCOPY  March 12, 2006   Had 2 polyps removed; one was an adenoma and the other one was hyperplastic  . COLONOSCOPY N/A 10/09/2012   Procedure: COLONOSCOPY;  Surgeon: Rogene Houston, MD;  [facility];  Service: Endoscopy;  Laterality: N/A;  1030-rescheduled to 12:00pm per [name]  . ENDARTERECTOMY  06/10/2012   Procedure: ENDARTERECTOMY CAROTID;  Surgeon: Rosetta Posner, MD;  Location: [place];  Service: Vascular;  Laterality: Right;  With Dacron Patch Angioplasty  . EYE SURGERY Right 10/2015   cataract  .  HERNIA REPAIR  age 15  . HYDROCELE EXCISION  2011  . stents    . varicose removal from scrotum  1982  . VASECTOMY  1989    FAMILY HISTORY: Family History  Problem Relation Age of Onset  . Diabetes Mother   . Hypertension Mother   . Heart disease Mother        Before age 45  . Hyperlipidemia Mother   . Heart attack Father   . Lung cancer Father   . Cancer Father        Lung  . Heart disease Father        Before age 77  . Hypertension Son     SOCIAL HISTORY:  Social History   Socioeconomic History  . Marital status: Married    Spouse name: Soliman Puleio  . Number of children: 2  . Years of education: 28yrs  . Highest education level: Not on file  Occupational History    Employer: [employer]: [place]  Social Needs  . Financial resource strain: Not on file  . Food insecurity    Worry: Not on file    Inability: Not on file  . Transportation needs    Medical: Not on file    Non-medical: Not on file  Tobacco Use  . Smoking status: Former Smoker    Packs/day: 2.00    Years: 20.00    Pack years: 40.00    Types: Cigarettes    Start date: 05/23/1968    Quit date: 08/21/1993    Years since quitting: 25.6  . Smokeless tobacco: Never Used  Substance and Sexual Activity  . Alcohol use: No    Alcohol/week: 0.0 standard drinks  . Drug use: No  . Sexual activity: Not on file  Lifestyle  . Physical activity    Days per week: Not on file    Minutes per session: Not on file  . Stress: Not on file  Relationships  . Social Herbalist on phone: Not on file    Gets together: Not on file    Attends religious service: Not on file    Active member of club or organization: Not on file    Attends meetings of clubs or organizations: Not on file    Relationship status: Not on file  . Intimate partner violence    Fear of current or ex partner: Not on  file    Emotionally abused: Not on file    Physically abused: Not on file    Forced sexual  activity: Not on file  Other Topics Concern  . Not on file  Social History Narrative   Full time. Married. Works at [employer].    Pt lives at home with his spouse.   Caffeine Use: none     PHYSICAL EXAM  Vitals:   04/21/19 1507 04/21/19 1515  BP: (!) 173/91 (!) 161/93  Pulse: (!) 58 (!) 58  Temp: (!) 96.9 F (36.1 C)   Weight: 241 lb 9.6 oz (109.6 kg)   Height: 6' (1.829 m)    Wt Readings from Last 3 Encounters:  04/21/19 241 lb 9.6 oz (109.6 kg)  05/16/18 237 lb (107.5 kg)  04/16/18 240 lb (108.9 kg)   Body mass index is 32.77 kg/m.  No exam data present  MMSE - Mini Mental State Exam 07/14/2014  Orientation to time 5  Orientation to Place 5  Registration 3  Attention/ Calculation 5  Recall 1  Language- name 2 objects 2  Language- repeat 1  Language- follow 3 step command 3  Language- read & follow direction 1  Write a sentence 1  Copy design 1  Total score 28    GENERAL EXAM: Patient is in no distress; well developed, nourished and groomed; neck is supple  CARDIOVASCULAR: Regular rate and rhythm, no murmurs, no carotid bruits  NEUROLOGIC: MENTAL STATUS: awake, alert, language fluent, comprehension intact, naming intact, fund of knowledge appropriate CRANIAL NERVE: no papilledema on fundoscopic exam, pupils equal and reactive to light, visual fields full to confrontation, extraocular muscles intact, no nystagmus; facial sensation and strength symmetric, hearing intact, palate elevates symmetrically, uvula midline, shoulder shrug symmetric, tongue midline. MOTOR: normal bulk and tone, full strength in the BUE, BLE SENSORY: normal and symmetric to light touch; EXCEPT DECR VIB IN HANDS AND FEET COORDINATION: finger-nose-finger, fine finger movements --> normal REFLEXES: deep tendon reflexes present and symmetric GAIT/STATION: narrow based gait    DIAGNOSTIC DATA (LABS, IMAGING, TESTING) - I reviewed patient records, labs, notes, testing and  imaging myself where available.  Lab Results  Component Value Date   WBC 4.9 04/16/2018   HGB 15.5 04/16/2018   HCT 44.2 04/16/2018   MCV 93 04/16/2018   PLT 165 04/16/2018      Component Value Date/Time   NA 137 04/16/2018 1352   K 4.6 04/16/2018 1352   CL 101 04/16/2018 1352   CO2 21 04/16/2018 1352   GLUCOSE 84 04/16/2018 1352   GLUCOSE 93 07/03/2015 1641   BUN 14 04/16/2018 1352   CREATININE 1.16 04/16/2018 1352   CALCIUM 9.9 04/16/2018 1352   PROT 7.4 04/16/2018 1352   ALBUMIN 4.8 04/16/2018 1352   AST 57 (H) 04/16/2018 1352   ALT 47 (H) 04/16/2018 1352   ALKPHOS 37 (L) 04/16/2018 1352   BILITOT 0.5 04/16/2018 1352   GFRNONAA 66 04/16/2018 1352   GFRAA 77 04/16/2018 1352   No results found for: CHOL, HDL, LDLCALC, LDLDIRECT, TRIG, CHOLHDL Lab Results  Component Value Date   HGBA1C 5.4 01/17/2013   Lab Results  Component Value Date   VITAMINB12 302 07/14/2014   Lab Results  Component Value Date   TSH 2.320 01/17/2013   Vit D, 25-Hydroxy  Date Value Ref Range Status  01/04/2016 31.8 30.0 - 100.0 ng/mL Final    Comment:    Vitamin D deficiency has been defined by the Institute of  Medicine and an Endocrine Society practice guideline as a level of serum 25-OH vitamin D less than 20 ng/mL (1,2). The Endocrine Society went on to further define vitamin D insufficiency as a level between 21 and 29 ng/mL (2). 1. IOM (Institute of Medicine). 2010. Dietary reference    intakes for calcium and D. [place]: The    [employer]. 2. Holick MF, Binkley Rankin, Bischoff-Ferrari HA, et al.    Evaluation, treatment, and prevention of vitamin D    deficiency: an Endocrine Society clinical practice    guideline. JCEM. 2011 Jul; 96(7):1911-30.    Lymphocytes Absolute  Date Value Ref Range Status  04/16/2018 0.9 0.7 - 3.1 x10E3/uL Final  01/04/2016 0.9 0.7 - 3.1 x10E3/uL Final  01/05/2015 0.8 0.7 - 3.1 x10E3/uL Final    02/06/13 MRI brain (with and  without) demonstrating: 1. Multiple periventricular and subcortical chronic demyelinating plaques. Chronic plaque noted in the right medulla. 2. No acute plaques. 3. No change from MRI on 04/02/12.  02/06/13 MRI thoracic spine (with and without) demonstrating: 1. Multiple chronic demyelinating plaques at T5, T6, T7 and T9-10. 2. No acute plaques. 3. No change from MRI on 04/02/12.  10/09/16 MRI brain  1. Stable compared to 07/26/2015.  No explanation for new face pain. 2. Multiple sclerosis with stable plaque burden and no signs of active demyelination. There is a plaque near the left trigeminal root entry zone, but stable. 3. Normal brain volume.  01/06/15 JCV ab - 1.33 H positive    ASSESSMENT AND PLAN  68 y.o. year old male here with multiple sclerosis and prior TIA.  MS --> since 1989; on betaseron 1995-2000; now doing well on tecfidera since Dec 2013. He reports being steroid intolerant in past (hallucinations, stomach irritation, weight gain).   TIA --> due to hypertension, hypercholesterolemia. Had right brain TIA on 04/08/12, with right ICA stenosis, s/p right CEA 06/10/12.   Dx:  MULTIPLE SCLEROSIS, RELAPSING/REMITTING  Left facial pain  Therapeutic drug monitoring    PLAN:  MULTIPLE SCLEROSIS DISEASE MODIFYING THERAPY (stable) - continue tecfidera for multiple sclerosis - annual CBC, CMP per PCP  LEFT FACIAL PAIN / TRIGEMINAL NEURALGIA - continue CBZ 500mg  twice a day   STROKE PREVENTION - continue stroke prevention with aggrenox, statin, BP control per PCP  Meds ordered this encounter  Medications  . carbamazepine (TEGRETOL XR) 100 MG 12 hr tablet    Sig: TAKE 5 TABLETS TWICE A DAY.    Dispense:  300 tablet    Refill:  12   Return in about 1 year (around 04/20/2020).      Penni Bombard, MD [id number], [id number] PM Certified in Neurology, Neurophysiology and Neuroimaging  [facility] [address] [place], [place]  [id number] [phone]

## 2019-04-24 ENCOUNTER — Other Ambulatory Visit: Payer: Self-pay | Admitting: *Deleted

## 2019-04-24 MED ORDER — CARBAMAZEPINE ER 100 MG PO TB12: ORAL_TABLET | ORAL | 12 refills | Status: DC

## 2019-04-24 MED ORDER — TECFIDERA 240 MG PO CPDR: DELAYED_RELEASE_CAPSULE | ORAL | 3 refills | Status: DC

## 2019-05-08 ENCOUNTER — Encounter: Payer: Self-pay | Admitting: Cardiovascular Disease

## 2019-05-08 ENCOUNTER — Telehealth: Payer: Self-pay | Admitting: Cardiovascular Disease

## 2019-05-08 ENCOUNTER — Encounter: Payer: Self-pay | Admitting: *Deleted

## 2019-05-08 ENCOUNTER — Telehealth (INDEPENDENT_AMBULATORY_CARE_PROVIDER_SITE_OTHER): Payer: Medicare Other | Admitting: Cardiovascular Disease

## 2019-05-08 ENCOUNTER — Other Ambulatory Visit: Payer: Self-pay

## 2019-05-08 VITALS — Ht 72.0 in | Wt 240.0 lb

## 2019-05-08 DIAGNOSIS — Z955 Presence of coronary angioplasty implant and graft: Secondary | ICD-10-CM

## 2019-05-08 DIAGNOSIS — E785 Hyperlipidemia, unspecified: Secondary | ICD-10-CM

## 2019-05-08 DIAGNOSIS — Z01818 Encounter for other preprocedural examination: Secondary | ICD-10-CM

## 2019-05-08 DIAGNOSIS — I10 Essential (primary) hypertension: Secondary | ICD-10-CM

## 2019-05-08 DIAGNOSIS — Z9889 Other specified postprocedural states: Secondary | ICD-10-CM

## 2019-05-08 DIAGNOSIS — I25118 Atherosclerotic heart disease of native coronary artery with other forms of angina pectoris: Secondary | ICD-10-CM

## 2019-05-08 NOTE — Patient Instructions (Addendum)
Medication Instructions:   Your physician recommends that you continue on your current medications as directed. Please refer to the Current Medication list given to you today.  Labwork:  NONE  Testing/Procedures:  NONE  Follow-Up:  Your physician recommends that you schedule a follow-up appointment in: 1 year. You will receive a reminder letter in the mail in about 10 months reminding you to call and schedule your appointment. If you don't receive this letter, please contact our office.  Any Other Special Instructions Will Be Listed Below (If Applicable).  Low risk for right knee arthroscopy (preop clearance).   If you need a refill on your cardiac medications before your next appointment, please call your pharmacy.

## 2019-05-08 NOTE — Telephone Encounter (Signed)
Virtual Visit Pre-Appointment Phone Call  "(Name), I am calling you today to discuss your upcoming appointment. We are currently trying to limit exposure to the virus that causes COVID-19 by seeing patients at home rather than in the office."  1. "What is the BEST phone number to call the day of the visit?" - include this in appointment notes  2. Do you have or have access to (through a family member/friend) a smartphone with video capability that we can use for your visit?" a. If yes - list this number in appt notes as cell (if different from BEST phone #) and list the appointment type as a VIDEO visit in appointment notes b. If no - list the appointment type as a PHONE visit in appointment notes  3. Confirm consent - "In the setting of the current Covid19 crisis, you are scheduled for a (phone or video) visit with your provider on (date) at (time).  Just as we do with many in-office visits, in order for you to participate in this visit, we must obtain consent.  If you'd like, I can send this to your mychart (if signed up) or email for you to review.  Otherwise, I can obtain your verbal consent now.  All virtual visits are billed to your insurance company just like a normal visit would be.  By agreeing to a virtual visit, we'd like you to understand that the technology does not allow for your provider to perform an examination, and thus may limit your provider's ability to fully assess your condition. If your provider identifies any concerns that need to be evaluated in person, we will make arrangements to do so.  Finally, though the technology is pretty good, we cannot assure that it will always work on either your or our end, and in the setting of a video visit, we may have to convert it to a phone-only visit.  In either situation, we cannot ensure that we have a secure connection.  Are you willing to proceed?" STAFF: Did the patient verbally acknowledge consent to telehealth visit? Document  YES/NO here: yes  4. Advise patient to be prepared - "Two hours prior to your appointment, go ahead and check your blood pressure, pulse, oxygen saturation, and your weight (if you have the equipment to check those) and write them all down. When your visit starts, your provider will ask you for this information. If you have an Apple Watch or Kardia device, please plan to have heart rate information ready on the day of your appointment. Please have a pen and paper handy nearby the day of the visit as well."  5. Give patient instructions for MyChart download to smartphone OR [email or web address] as below if video visit (depending on what platform provider is using)  6. Inform patient they will receive a phone call 15 minutes prior to their appointment time (may be from unknown caller ID) so they should be prepared to answer    TELEPHONE CALL NOTE  Grant Valdez has been deemed a candidate for a follow-up tele-health visit to limit community exposure during the Covid-19 pandemic. I spoke with the patient via phone to ensure availability of phone/video source, confirm preferred email & phone number, and discuss instructions and expectations.  I reminded Grant Valdez to be prepared with any vital sign and/or heart rhythm information that could potentially be obtained via home monitoring, at the time of his visit. I reminded Grant Valdez to expect a phone call prior to  his visit.  [name] 05/08/2019 9:52 AM

## 2019-05-08 NOTE — Progress Notes (Signed)
Virtual Visit via Telephone Note   This visit type was conducted due to national recommendations for restrictions regarding the COVID-19 Pandemic (e.g. social distancing) in an effort to limit this patient's exposure and mitigate transmission in our community.  Due to his co-morbid illnesses, this patient is at least at moderate risk for complications without adequate follow up.  This format is felt to be most appropriate for this patient at this time.  The patient did not have access to video technology/had technical difficulties with video requiring transitioning to audio format only (telephone).  All issues noted in this document were discussed and addressed.  No physical exam could be performed with this format.  Please refer to the patient's chart for his  consent to telehealth for [facility].   Date:  05/08/2019   ID:  Grant Valdez, DOB 20-Apr-1951, MRN [id number]  Patient Location: Home Provider Location: Office  PCP:  Glenda Chroman, MD  Cardiologist:  Kate Sable, MD  Electrophysiologist:  None   Evaluation Performed:  Follow-Up Visit  Chief Complaint:  CAD  History of Present Illness:    Grant Valdez is a 68 y.o. male with a past medical history significant for coronary artery disease with prior percutaneous coronary intervention (drug-eluting stents to the mid RCA and proximal LAD in 2002), TIAs, right carotid endarterectomy in October 2013, hyperlipidemia, hypertension, and multiple sclerosis.  He underwent a low risk nuclear stress test on 11/28/2017.  There may have been a mild region of ischemia versus variable soft tissue attenuation.  LVEF 65%.  The patient denies any symptoms of chest pain, palpitations, shortness of breath, lightheadedness, dizziness, leg swelling, orthopnea, PND, and syncope.  He is going to undergo right knee arthroscopy for a torn meniscus.  The patient does not have symptoms concerning for COVID-19 infection (fever, chills, cough,  or new shortness of breath).   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  Past Medical History:  Diagnosis Date   Anxiety    CAD (coronary artery disease)    had 2 arteries tented four years ago sees Dr. Dannielle Burn   Carotid artery occlusion    Depression    Diabetes mellitus without complication ([place])    GERD (gastroesophageal reflux disease)    Headache(784.0)    migraines hx of   HLD (hyperlipidemia)    HTN (hypertension)    Sees Dr. Woody Seller   MS (multiple sclerosis) ([place])    diagnosed in 1989. He was treated with prednisone followed by betaseron which was gradually tapered because his LFTs went up and it resolved off therapy. Likely he is remained in remission for years   Neuromuscular disorder [facility])    has Multiple Sclerosis, sees Dr. Leta Baptist   S/P right inguinal herniorrhaphy    Stroke [facility])    "patient states has had several TIA's, last one 05/27/12"   TIA (transient ischemic attack)    Trigeminal neuralgia    Past Surgical History:  Procedure Laterality Date   CAROTID ENDARTERECTOMY     COLONOSCOPY  March 12, 2006   Had 2 polyps removed; one was an adenoma and the other one was hyperplastic   COLONOSCOPY N/A 10/09/2012   Procedure: COLONOSCOPY;  Surgeon: Rogene Houston, MD;  Location: [facility] ENDO SUITE;  Service: Endoscopy;  Laterality: N/A;  1030-rescheduled to 12:00pm per [name]   ENDARTERECTOMY  06/10/2012   Procedure: ENDARTERECTOMY CAROTID;  Surgeon: Rosetta Posner, MD;  Location: [facility] OR;  Service: Vascular;  Laterality: Right;  With Dacron Patch Angioplasty   EYE SURGERY Right 10/2015   cataract   HERNIA REPAIR  age 66   HYDROCELE EXCISION  2011   stents     varicose removal from scrotum  1982   VASECTOMY  1989     Current Meds  Medication Sig   buPROPion (WELLBUTRIN XL) 300 MG 24 hr tablet Take 200 mg by mouth daily.    carbamazepine (TEGRETOL XR) 100 MG 12 hr tablet TAKE 5  TABLETS TWICE A DAY.   Choline Fenofibrate (FENOFIBRIC ACID) 135 MG CPDR Take 1 capsule by mouth daily.    citalopram (CELEXA) 20 MG tablet Take 20 mg by mouth daily.   Dimethyl Fumarate (TECFIDERA) 240 MG CPDR TAKE 1 CAPSULE (240 MG TOTAL) BY MOUTH 2 TIMES DAILY   dipyridamole-aspirin (AGGRENOX) 200-25 MG 12hr capsule Take 1 capsule by mouth 2 (two) times daily.   LORazepam (ATIVAN) 0.5 MG tablet Take 0.5 mg by mouth daily. nerves   metoprolol (LOPRESSOR) 50 MG tablet Take 25 mg by mouth 2 (two) times daily.    Multiple Vitamin (MULTIVITAMIN WITH MINERALS) TABS tablet Take 1 tablet by mouth daily.   nitroGLYCERIN (NITROSTAT) 0.4 MG SL tablet Place 1 tablet (0.4 mg total) under the tongue every 5 (five) minutes as needed for chest pain.   Omega-3 Fatty Acids (FISH OIL) 1000 MG CAPS Take 1 capsule by mouth 2 (two) times daily.    omeprazole (PRILOSEC) 20 MG capsule Take 20 mg by mouth daily.     rosuvastatin (CRESTOR) 20 MG tablet TAKE 1 TABLET BY MOUTH ONCE DAILY (STOP PRAVASTATIN)     Allergies:   Patient has no known allergies.   Social History   Tobacco Use   Smoking status: Former Smoker    Packs/day: 2.00    Years: 20.00    Pack years: 40.00    Types: Cigarettes    Start date: 05/23/1968    Quit date: 08/21/1993    Years since quitting: 25.7   Smokeless tobacco: Never Used  Substance Use Topics   Alcohol use: No    Alcohol/week: 0.0 standard drinks   Drug use: No     Family Hx: The patient's family history includes Cancer in his father; Diabetes in his mother; Heart attack in his father; Heart disease in his father and mother; Hyperlipidemia in his mother; Hypertension in his mother and son; Lung cancer in his father.  ROS:   Please see the history of present illness.     All other systems reviewed and are negative.   Prior CV studies:   The following studies were reviewed today:  See above  Labs/Other Tests and Data Reviewed:    EKG:  No ECG  reviewed.  Recent Labs: No results found for requested labs within last 8760 hours.   Recent Lipid Panel No results found for: CHOL, TRIG, HDL, CHOLHDL, LDLCALC, LDLDIRECT  Wt Readings from Last 3 Encounters:  05/08/19 240 lb (108.9 kg)  04/21/19 241 lb 9.6 oz (109.6 kg)  05/16/18 237 lb (107.5 kg)     Objective:    Vital Signs:  Ht 6' (1.829 m)    Wt 240 lb (108.9 kg)    BMI 32.55 kg/m    VITAL SIGNS:  reviewed  ASSESSMENT & PLAN:    1. CAD with mid RCA and proximal LAD stentswith chest pain: Symptomatically stable.  Low risk nuclear stress test with normal left ventricular systolic function  as noted above. Onmetoprolol, Aggrenox, and statin.  No further cardiac testing is indicated at this time.  2. Essential HTN: Controlled.  No changes to therapy.  3. Hyperlipidemia: On statin therapy. I will obtain a copy of lipids from PCP.  4. Preoperative risk stratification: He has a torn meniscus in the right knee and is being scheduled for arthroscopy. He can proceed with an acceptable level of risk (low risk for major adverse cardiac events).   COVID-19 Education: The signs and symptoms of COVID-19 were discussed with the patient and how to seek care for testing (follow up with PCP or arrange E-visit).  The importance of social distancing was discussed today.  Time:   Today, I have spent 5 minutes with the patient with telehealth technology discussing the above problems.     Medication Adjustments/Labs and Tests Ordered: Current medicines are reviewed at length with the patient today.  Concerns regarding medicines are outlined above.   Tests Ordered: No orders of the defined types were placed in this encounter.   Medication Changes: No orders of the defined types were placed in this encounter.   Follow Up:  Virtual Visit or In Person in 1 year(s)  Signed, Kate Sable, MD  05/08/2019 10:37 AM    [place]

## 2019-05-12 ENCOUNTER — Ambulatory Visit: Payer: Medicare Other | Admitting: Cardiovascular Disease

## 2019-05-12 ENCOUNTER — Telehealth: Payer: Self-pay | Admitting: *Deleted

## 2019-05-12 NOTE — Telephone Encounter (Signed)
Surgery release completed, signed by Dr Leta Baptist and faxed to Raliegh Ip with last office note as requested.

## 2019-05-12 NOTE — Telephone Encounter (Signed)
Received surgery clearance form from [facility] orthopedic; placed on DR [name]'s desk for completion, signature.

## 2019-05-19 ENCOUNTER — Encounter (INDEPENDENT_AMBULATORY_CARE_PROVIDER_SITE_OTHER): Payer: Self-pay | Admitting: *Deleted

## 2019-05-26 ENCOUNTER — Other Ambulatory Visit: Payer: Self-pay | Admitting: Diagnostic Neuroimaging

## 2019-05-26 ENCOUNTER — Telehealth: Payer: Self-pay | Admitting: Diagnostic Neuroimaging

## 2019-05-26 NOTE — Telephone Encounter (Signed)
Refill was sent to [place] [facility] in Sept. Called [facility], spoke with pharmacist, Dorian Pod and advised a Rx was sent in Sept with 90 day supply plus refills.She stated that unless Rx is DAW, tecfidera generic will be sent to the patient. I advised her Rx needs to be DAW; she stated they would fill Rx as DAW,  verbalized understanding. Called patient, spoke with wife on DPR and advised her. She stated patient got call from [employer] for Tecfidera stating that unless he had 90 day Rx they couldn't give co pay assistance. I advised her that Dorian Pod, pharmacist with [facility] assured me they had 90 day Rx. She verbalized understanding, appreciation.

## 2019-05-26 NOTE — Telephone Encounter (Signed)
Pt is calling in requesting a 90day script for his Dimethyl Fumarate (TECFIDERA) 240 MG CPDR to be sent to [address], [place], [place]. [address]

## 2019-10-22 DIAGNOSIS — Z87891 Personal history of nicotine dependence: Secondary | ICD-10-CM | POA: Diagnosis not present

## 2019-10-22 DIAGNOSIS — Z299 Encounter for prophylactic measures, unspecified: Secondary | ICD-10-CM | POA: Diagnosis not present

## 2019-10-22 DIAGNOSIS — G35 Multiple sclerosis: Secondary | ICD-10-CM | POA: Diagnosis not present

## 2019-10-22 DIAGNOSIS — I10 Essential (primary) hypertension: Secondary | ICD-10-CM | POA: Diagnosis not present

## 2019-10-30 ENCOUNTER — Telehealth: Payer: Self-pay | Admitting: Diagnostic Neuroimaging

## 2019-10-30 NOTE — Telephone Encounter (Signed)
Pt wife states the manufacturer for Dimethyl Fumarate (TECFIDERA) 240 MG CPDR contacted them in regards to the pts assistance. They are requesting the pt be prescribed the new medication so that he can continue to get assistance for his medication

## 2019-11-03 ENCOUNTER — Other Ambulatory Visit: Payer: Self-pay | Admitting: Diagnostic Neuroimaging

## 2019-11-03 ENCOUNTER — Telehealth: Payer: Self-pay | Admitting: Diagnostic Neuroimaging

## 2019-11-03 NOTE — Telephone Encounter (Addendum)
Received message from phone room, patient on line. I was unable to take his call.  Called patient back who stated his wife is on phone now with insurance regarding Tecfidera. He will call back tomorrow.

## 2019-11-03 NOTE — Telephone Encounter (Signed)
Called [employer], spoke with Davy Pique who stated the patient called because he is in their Tecfidera Free drug program which discontinues 01/19/20. They discussed him switching to Vumerity. Called patient who stated he will call his insurance to ask about cost of generic Tecfidera and Vumerity. He'll call back.

## 2019-11-12 NOTE — Telephone Encounter (Signed)
Called patient for update on Tecfidera. He stated he has 1 month supply, has talked to [place] and his insurance company. [employer] sent him information on Vumerity. He wants to call back after he hears from insurance. He has been on Tecfidera since 06/2013. Patient verbalized understanding, appreciation.

## 2020-01-20 ENCOUNTER — Telehealth: Payer: Self-pay | Admitting: *Deleted

## 2020-01-20 NOTE — Telephone Encounter (Signed)
Called patient to discuss Tecfidera which no longer has patient assistance.  Wife Vicente Males answered, on [place] and I advised her we didn't want him to run out of medication. She'll call his insurance to discuss generic, dimethyl fumarate coverage and call back. Vicente Males verbalized understanding, appreciation.

## 2020-02-04 NOTE — Telephone Encounter (Signed)
Received call from wife, Grant Valdez stated she wasn't able to get price on tecfidera from her insurance pharmacy, [facility]. She was told they need Rx on file to get price. I advised her that the RX sent last Sept  Was x 1 year to [facility]. She confirmed that is where his tecfidera comes from; she'll call [facility] to get price. Grant Valdez verbalized understanding, appreciation, will call back.

## 2020-02-13 DIAGNOSIS — Z299 Encounter for prophylactic measures, unspecified: Secondary | ICD-10-CM | POA: Diagnosis not present

## 2020-02-13 DIAGNOSIS — T753XXA Motion sickness, initial encounter: Secondary | ICD-10-CM | POA: Diagnosis not present

## 2020-02-13 DIAGNOSIS — G35 Multiple sclerosis: Secondary | ICD-10-CM | POA: Diagnosis not present

## 2020-02-24 NOTE — Telephone Encounter (Signed)
Received fax from [facility] re: they have referral for dimethyl fumarate 240 mg DR cap. -However patient's co pay is high. They are placing referral on hold while looking into co pay- a-ssistance through foundations. Oren Beckmann, Warden/ranger, [phone], f [phone]

## 2020-02-25 ENCOUNTER — Telehealth: Payer: Self-pay

## 2020-02-25 NOTE — Telephone Encounter (Signed)
Ok to switch to vumerity. -VRP

## 2020-02-25 NOTE — Telephone Encounter (Signed)
Error

## 2020-02-25 NOTE — Telephone Encounter (Addendum)
Spoke with wife who stated they can't afford copay of >$600 for dimethyl fumarate. I advised her of process required to begin new Vumerity MS medication. She asked if he should consider another MS medication. He was on Betaseron years ago but developed injection fatigue and "sores on his legs". I advised will send to Dr Leta Baptist for his recommendations and call her back. She verbalized understanding, appreciation.

## 2020-02-25 NOTE — Telephone Encounter (Signed)
Pt's wife Vicente Males called and said the Pharmacy said the Tecfidera would cost [phone]. Vicente Males said the pt would need a rx for Vermity and she needs the nurse to call [employer] to get the pt enrolled in a free drug program for this new medication

## 2020-02-26 NOTE — Telephone Encounter (Signed)
I called pt, spoke with pt's wife Vicente Males. Pt is agreeable to coming to office to sign the start form for vumerity. They will bring a copy of their medical and pharmacy insurance cards as well. I explained that we will do a PA for vumerity when the start form ins completed and pursue patient assistance if needed. Pt's wife verbalized understanding. She indicated that they will likely come by Monday or Tuesday of next week to sign the start form.

## 2020-03-02 NOTE — Telephone Encounter (Signed)
Dr. Leta Baptist signed the start form. Faxed start form to [place]. Received a receipt of confirmation.

## 2020-03-02 NOTE — Telephone Encounter (Signed)
Pt came by and signed the start form.  I initiated PA for vumerity on covermymeds. Key: [id number]. Case ID: [id number]. I called [facility] to confirm they received PA since [facility] reports that they cannot find the pt. They did receive PA. Can call [phone] for questions.

## 2020-03-03 ENCOUNTER — Telehealth: Payer: Self-pay | Admitting: Diagnostic Neuroimaging

## 2020-03-03 NOTE — Telephone Encounter (Signed)
Received fax form [facility] of denial of Vumerity. Patient needed to try one of: Aubagio, avonex, avonex pen, Betaseron, rebif, rebif rebidose titraiotn pack, gilenya, glatopa or glatiramer, mayzent. Patient started Betaseron 1995, took himself off in 2011. He started Tecfidera 07/21/2012 and has been on it since.  Pt ID: [id number], ref # [id number].

## 2020-03-03 NOTE — Telephone Encounter (Signed)
[place]  Has called to inquire about pt switching from Dimethyl Fumarate (TECFIDERA) 240 MG CPDR switching to Vulmertiy.  Megan asked if so a Rx can be faxed to them at [phone] Jinny Blossom can be called at [phone] and a Pt Care Coordinator can be spoken to

## 2020-03-08 NOTE — Telephone Encounter (Addendum)
Per Stanton Kidney Clare's phone note from 03/03/2020: "Received fax form [facility] of denial of Vumerity. Patient needed to try one of: Aubagio, avonex, avonex pen, Betaseron, rebif, rebif rebidose titraiotn pack, gilenya, glatopa or glatiramer, mayzent. Patient started Betaseron 1995, took himself off in 2011. He started Tecfidera 07/21/2012 and has been on it since.  Pt ID: [id number], ref # [id number]."  Appeal letter written and signed by Dr. Leta Baptist. Faxed to [facility] appeals department. Should have a determination within 7 days and then can call [place] back.

## 2020-03-10 ENCOUNTER — Telehealth: Payer: Self-pay | Admitting: *Deleted

## 2020-03-10 NOTE — Telephone Encounter (Signed)
Received call from Simms with [facility] who stated he noted the patient's Vumerirty was denied by his insurance. I advised him the denial has been appealed. He will make note. He stated they still have on file a Rx for dimethyl fumarate. He is asking if it should be kept on file. I advised him it does not. He stated if Vumerity is approved and we want [place] to provide medication we can fax Rx to them. He  verbalized understanding, appreciation.

## 2020-03-11 NOTE — Telephone Encounter (Signed)
Wife called asking for Vumerity update. I advised her on 03/08/20 Cyril Mourning RN sent in appeal letter for denial.  She stated patient only has several days of medication left. I advised we should have a decision within 7 days which would be my Monday. He'll get a call when decision is known. She  verbalized understanding, appreciation.

## 2020-03-15 NOTE — Telephone Encounter (Signed)
I called [facility]. The denial for vumerity was overturned and vumerity has now been approved from 03/11/2020-03/11/2021. Case #: [id number].  I have informed Crystal at [facility].  I called pt to discuss. No answer, left a message asking him to call me back.

## 2020-03-15 NOTE — Telephone Encounter (Signed)
Pt's wife returned my call. I advised her that the denial for vumerity was overturned and that [employer] should be reaching out to them. Pt's wife will call [employer] today as well.

## 2020-03-16 ENCOUNTER — Telehealth: Payer: Self-pay | Admitting: Diagnostic Neuroimaging

## 2020-03-16 MED ORDER — VUMERITY 231 MG PO CPDR: DELAYED_RELEASE_CAPSULE | ORAL | 3 refills | Status: DC

## 2020-03-16 MED ORDER — VUMERITY 231 MG PO CPDR: DELAYED_RELEASE_CAPSULE | ORAL | 0 refills | Status: DC

## 2020-03-16 NOTE — Addendum Note (Signed)
Addended by: Lester Tombstone A on: 03/16/2020 01:50 PM   Modules accepted: Orders

## 2020-03-16 NOTE — Telephone Encounter (Signed)
Pt's wife Carin Primrose (on [place]) called to leave a message for nurse Verneita Griffes). Got Pt on a free drug program  To send prescription to [facility] Fax number [phone] for Vumerity. To speed up everything you can do a Verbal Rx. Wife would like for nurse to call her.

## 2020-03-16 NOTE — Telephone Encounter (Signed)
Received this notice from [facility]: "Hello! The patient was just enrolled in Free Drug a few hours ago. You can go ahead and send [facility] a [place] script."  Order for titration and maintenance for vumerity e-scribed to [place].  I called pt's wife, Vicente Males, per [facility] and discussed this with her. Pt's wife verbalized understanding and appreciation.

## 2020-03-16 NOTE — Telephone Encounter (Signed)
I have reached out to [place] to verify if this is ok to do.

## 2020-03-17 NOTE — Telephone Encounter (Signed)
Received fax from [facility]: Vumerity shipped to patient, [facility]. Pharmacy line [phone], F [phone].

## 2020-03-22 NOTE — Telephone Encounter (Signed)
I called pt. He received his shipment of vumerity and other than some mild dizziness noted the first day he has not experienced any other side effects. He will call our office with questions or concerns.

## 2020-03-31 ENCOUNTER — Telehealth: Payer: Self-pay

## 2020-03-31 NOTE — Telephone Encounter (Signed)
Patient's wife called and said that the patient was switched to Vumerity about a week ago and just bumped up to 2 tablets this past Sunday. Pt was also throwing up really bad on Sunday night due to being out on a boat that day and got really sea sick. She says that his facial nerve that hasn't flared up in over a year is now hurting him really bad and they are unsure if it is from the change in medication or from the throwing up. Please call to discuss.

## 2020-03-31 NOTE — Telephone Encounter (Addendum)
Called wife, Vicente Males and advised her his pain is most likely due to his vomiting, not Vumerity. Vumerity  Typically is easy on GI system. She stated she will monitor him, call back tomorrow if he is not better. He is  on carbamazepine 5 tabs twice daily. Vicente Males verbalized understanding, appreciation.

## 2020-04-15 NOTE — Telephone Encounter (Signed)
error 

## 2020-04-21 ENCOUNTER — Ambulatory Visit: Payer: Self-pay | Admitting: Diagnostic Neuroimaging

## 2020-04-27 ENCOUNTER — Other Ambulatory Visit: Payer: Self-pay | Admitting: Diagnostic Neuroimaging

## 2020-05-24 ENCOUNTER — Ambulatory Visit: Payer: Self-pay | Admitting: Diagnostic Neuroimaging

## 2020-06-09 ENCOUNTER — Other Ambulatory Visit: Payer: Self-pay | Admitting: Dermatology

## 2020-06-09 NOTE — Telephone Encounter (Signed)
Grant Valdez called . Laney needs refill on rosacea cream that  they put a pill in and crush it (compound cream) [facility] Rx'd for him. They now live in [place], [place]. New pharmacy is in [place] called [facility]. Fax: [phone].

## 2020-06-10 NOTE — Telephone Encounter (Signed)
If they can find a nearby pharmacy who can compound medications I am okay refilling this x5.  You may have to get the formula from the paper chart.

## 2020-06-10 NOTE — Telephone Encounter (Signed)
Phoned in refill for compound  Metronidazole 2 % powder in 1 % hydrocortisone cream 4 oz apply to face qd for rosacea 10 refills at [facility]

## 2020-06-18 DIAGNOSIS — E7849 Other hyperlipidemia: Secondary | ICD-10-CM | POA: Diagnosis not present

## 2020-06-18 DIAGNOSIS — K219 Gastro-esophageal reflux disease without esophagitis: Secondary | ICD-10-CM | POA: Diagnosis not present

## 2020-07-20 DIAGNOSIS — K219 Gastro-esophageal reflux disease without esophagitis: Secondary | ICD-10-CM | POA: Diagnosis not present

## 2020-07-20 DIAGNOSIS — J40 Bronchitis, not specified as acute or chronic: Secondary | ICD-10-CM | POA: Diagnosis not present

## 2021-01-01 ENCOUNTER — Other Ambulatory Visit: Payer: Self-pay | Admitting: Diagnostic Neuroimaging

## 2021-01-08 ENCOUNTER — Other Ambulatory Visit: Payer: Self-pay | Admitting: Diagnostic Neuroimaging

## 2021-03-08 ENCOUNTER — Other Ambulatory Visit: Payer: Self-pay | Admitting: Diagnostic Neuroimaging

## 2021-03-23 ENCOUNTER — Telehealth: Payer: Self-pay | Admitting: Diagnostic Neuroimaging

## 2021-03-23 NOTE — Telephone Encounter (Signed)
FYI for RN, pt called back to inform the speciality pharmacy re: his Vumerity has already been notified of his new address.  Pt states there is not a Garment/textile technologist in [place] to the [place] that treats MS.  Pt asked that I relay this to RN.

## 2021-03-23 NOTE — Telephone Encounter (Signed)
Pt's wife Grant Valdez called requesting a refill for her husbands Diroximel Fumarate (VUMERITY) 231 MG CPDR and carbamazepine (TEGRETOL XR) 100 MG 12 hr tablet. [place] [facility]  [address] [place], [place] [address]. Wife says they have not yet found a new neuro doctor, but her husband is completely out of his medication. Wife is requesting a call back.

## 2021-03-23 NOTE — Telephone Encounter (Addendum)
I called pt's wife back. Pt was last seen by Dr. Leta Baptist in 2020. Since we have not seen/evaluated the pt in 2 years, we cannot safely rx these meds. Pt was scheduled for f/u in oct of 2021 and refills were given pending this appt and pt c/a appt.  I did advise via vm that a PCP could possibly help with the Carbamazepine refill, but the Vumerity could not be called into the local pharmacy any way since this a specialty pharamcy. I advised Pt would need to call the  Specialty pharmacy and update address for Vumerity to be sent over.   I advised via vm it is imperative for the pt to est with a new neurologist.    Phone staff can relay this information.

## 2021-03-23 NOTE — Telephone Encounter (Signed)
I have sent the pt a mychart message about the possibility of doing a video visit if he is stable during interim of finding a new Neurologist.

## 2021-03-28 ENCOUNTER — Other Ambulatory Visit: Payer: Self-pay

## 2021-03-28 ENCOUNTER — Telehealth: Payer: Self-pay | Admitting: Diagnostic Neuroimaging

## 2021-03-28 MED ORDER — VUMERITY 231 MG PO CPDR: DELAYED_RELEASE_CAPSULE | ORAL | 0 refills | Status: DC

## 2021-03-28 NOTE — Telephone Encounter (Signed)
Pt is asking his Diroximel Fumarate (VUMERITY) 231 MG CPDR  be called into [facility]

## 2021-03-28 NOTE — Telephone Encounter (Signed)
Pt has not been seen since 04/21/2019, almost 2 yrs. He did schedule a VV with you for sept. Are you willing to refill this medication until appt completed or no?

## 2021-03-28 NOTE — Telephone Encounter (Signed)
..   Pt understands that although there may be some limitations with this type of visit, we will take all precautions to reduce any security or privacy concerns.  Pt understands that this will be treated like an in office visit and we will file with pt's insurance, and there may be a patient responsible charge related to this service. ? ?

## 2021-03-29 NOTE — Telephone Encounter (Signed)
Called [facility], spoke with Elmyra Ricks pharmacist and clarified for her that the patient has been on maintenance dose.  She will change the Rx sent yesterday to reflect Vumerity maintenance dose. She  verbalized understanding, appreciation.

## 2021-03-29 NOTE — Addendum Note (Signed)
Addended by: Florian Buff C on: 03/29/2021 01:08 PM   Modules accepted: Orders

## 2021-03-29 NOTE — Telephone Encounter (Signed)
Emil @ [facility] is asking for a call to discuss that pt has been on the maintenance dose of (VUMERITY) that pt is on.  Please call [phone] option 2 for a pharmacist

## 2021-04-28 ENCOUNTER — Other Ambulatory Visit: Payer: Self-pay | Admitting: *Deleted

## 2021-04-28 DIAGNOSIS — G35 Multiple sclerosis: Secondary | ICD-10-CM

## 2021-04-28 MED ORDER — VUMERITY 231 MG PO CPDR: DELAYED_RELEASE_CAPSULE | ORAL | 3 refills | Status: DC

## 2021-05-17 ENCOUNTER — Encounter: Payer: Self-pay | Admitting: Diagnostic Neuroimaging

## 2021-05-17 ENCOUNTER — Telehealth (INDEPENDENT_AMBULATORY_CARE_PROVIDER_SITE_OTHER): Payer: Medicare Other | Admitting: Diagnostic Neuroimaging

## 2021-05-17 DIAGNOSIS — G35 Multiple sclerosis: Secondary | ICD-10-CM

## 2021-05-17 MED ORDER — VUMERITY 231 MG PO CPDR: 462.0000 mg | DELAYED_RELEASE_CAPSULE | Freq: Two times a day (BID) | ORAL | 4 refills | Status: DC

## 2021-05-17 MED ORDER — CARBAMAZEPINE ER 100 MG PO TB12: 500.0000 mg | ORAL_TABLET | Freq: Two times a day (BID) | ORAL | 12 refills | Status: DC

## 2021-05-17 NOTE — Progress Notes (Signed)
[facility]  PATIENT: Grant Valdez DOB: 1951/02/26  REFERRING CLINICIAN:  HISTORY FROM: patient  REASON FOR VISIT: follow up   HISTORICAL  CHIEF COMPLAINT:  Chief Complaint  Patient presents with   Multiple Sclerosis     HISTORY OF PRESENT ILLNESS:   UPDATE (05/17/21, VRP): Since last visit, doing well. Now living in [place], [place], near [place], [place]. Tolerating vumerity. TN sxs are stable. Had labs via new PCP.   UPDATE (04/21/19, VRP): Since last visit, doing fair. TN sxs stable on CBZ 500mg  twice a day. Tecfidera stable. Symptoms are stable. No alleviating or aggravating factors.    UPDATE (07/17/17, VRP): Since last visit, doing well. Tolerating tecfidera. No alleviating or aggravating factors.   UPDATE 01/09/17: Since last visit, left facial pain improved. Tolerating CBZ. Now on CBZ 100mg  BID. No new MS symptoms.  UPDATE 10/04/16: Since last visit, was doing well until yesterday; now having new onset of left facial pain, intermittent, shooting electrical pain, from left lower jaw up to left eye. Triggered by brushing teeth, licking lip or touching. Multiple attacks per day. Tolerating tecfidera.   UPDATE 01/04/16: Since last visit, doing about the same. No new sxs. Tolerating tecfidera.   UPDATE 07/07/15: No new issues. Tolerating meds. Overall doing well.  UPDATE 01/05/15: Since last visit, overall stable. Still notes some balance difficulty. He is trying to increase his walking back up to 2 miles per day. Tolerating meds. No new issues.  UPDATE 07/14/14 (LL): Patient failed to follow up for a year, was supposed to follow up in 3 months after last visit.  He did not want to do PT. He has been well on Tecfidera with only flushing side effect. He presents today to have labs and get refills for his medication. Has fallen 2-3 times in the last year, no injuries. Complaints of bilateral finger numbness and paresthesias, slower cognitive processing time,  fatigue. MMSE 28/30, AFT 18.  UPDATE 06/17/13: Patient presents for followup. Since last visit he has fallen 4 times including breaking his left foot, almost 4 months ago. Otherwise patient is stable. He still has issues with fatigue depression anxiety.  UPDATE 01/17/13: patient returns for urgent followup. Over past 3 weeks, patient has had increasing dizziness, balance difficulty, urination control problems. He feels some additional numbness or weakness in his legs. No difficulty with his hands or arms. No vision changes, slurred speech, trouble talking. Wife notes some more mood swings, irritability, depression, anxiety, attention and focus problems. He also has had more difficulty with hearing.  UPDATE 09/30/12: Since last visit, doing well. Started tecfidera on Jul 21, 2012. Initially flushing and GI sxs, but have eased up. No new MS flares or sxs.  UPDATE 06/19/12: S/p right CEA on 06/10/12. No complications. No new stroke/TIA symptoms. Will proceed with MS treatment.   UPDATE 05/01/12: Since last visit, had MRI brain, c and t spine, confirming chronic MS. On 04/08/12, had sudden onset dizziness, seeing floaters, and left face arm leg numbness x 10 minutes. Didn't tell anyone for 4 days. Had full recovery. Today carotid u/s shows right ICA stenosis (50-69%). Also with fatigue, malaise. Thinking about disability.  PRIOR HPI: 70 year old right-handed male with history of hypertension, hypercholesteremia, coronary artery disease, depression, anxiety, multiple sclerosis, TIA, here for evaluation and management of MS. In 1989, patient developed numbness spots in the back. He was evaluated by Dr. Rosiland Oz ([place]), with MRI and lumbar puncture, and given a diagnosis of MS. We do not have those  records to review today. 2 years later he transferred care to Dr. Aggie Hacker [facility]), who treated him with prednisone. Over these years she developed some staggering, balance difficulty, vision problems. In 1995 he was  started on Betaseron. Over the next 10-15 years he was on and off of Betaseron, do to fluctuation in liver function testing. Patient last saw Dr. Aggie Hacker in 2011. He saw a different neurologist at [facility] in 2012. Since that time he has not seen a neurologist. His last MRI was around 2000 102,011, which is apparently stable. He weaned himself off of Betaseron in 2011 because he felt that his symptoms weren't fairly mild.  In 2013 he is started to develop increasing fatigue, balance difficulty, numbness in his hands and feet. Also having trouble with coordination.  REVIEW OF SYSTEMS: Full 14 system review of systems performed and negative except: as per HPI.    ALLERGIES: No Known Allergies  HOME MEDICATIONS: Outpatient Medications Prior to Visit  Medication Sig Dispense Refill   buPROPion (WELLBUTRIN XL) 300 MG 24 hr tablet Take 200 mg by mouth daily.      carbamazepine (TEGRETOL XR) 100 MG 12 hr tablet TAKE 5 TABLETS BY MOUTH TWICE DAILY 300 tablet 7   Choline Fenofibrate (FENOFIBRIC ACID) 135 MG CPDR Take 1 capsule by mouth daily.   0   citalopram (CELEXA) 20 MG tablet Take 20 mg by mouth daily.  0   dipyridamole-aspirin (AGGRENOX) 200-25 MG 12hr capsule Take 1 capsule by mouth 2 (two) times daily.  0   Diroximel Fumarate (VUMERITY) 231 MG CPDR Maintenance dose: 462mg  PO BID 360 capsule 3   LORazepam (ATIVAN) 0.5 MG tablet Take 0.5 mg by mouth daily. nerves     metoprolol (LOPRESSOR) 50 MG tablet Take 25 mg by mouth 2 (two) times daily.      Multiple Vitamin (MULTIVITAMIN WITH MINERALS) TABS tablet Take 1 tablet by mouth daily.     nitroGLYCERIN (NITROSTAT) 0.4 MG SL tablet Place 1 tablet (0.4 mg total) under the tongue every 5 (five) minutes as needed for chest pain. 25 tablet 3   Omega-3 Fatty Acids (FISH OIL) 1000 MG CAPS Take 1 capsule by mouth 2 (two) times daily.      omeprazole (PRILOSEC) 20 MG capsule Take 20 mg by mouth daily.       rosuvastatin (CRESTOR) 20 MG tablet  TAKE 1 TABLET BY MOUTH ONCE DAILY (STOP PRAVASTATIN)     No facility-administered medications prior to visit.    PAST MEDICAL HISTORY: Past Medical History:  Diagnosis Date   Anxiety    CAD (coronary artery disease)    had 2 arteries tented four years ago sees Dr. Dannielle Burn   Carotid artery occlusion    Depression    Diabetes mellitus without complication ([place])    GERD (gastroesophageal reflux disease)    Headache(784.0)    migraines hx of   HLD (hyperlipidemia)    HTN (hypertension)    Sees Dr. Woody Seller   MS (multiple sclerosis) ([place])    diagnosed in 1989. He was treated with prednisone followed by betaseron which was gradually tapered because his LFTs went up and it resolved off therapy. Likely he is remained in remission for years   Neuromuscular disorder [facility])    has Multiple Sclerosis, sees Dr. Leta Baptist   S/P right inguinal herniorrhaphy    Stroke [facility])    "patient states has had several TIA's, last one 05/27/12"   TIA (transient ischemic attack)    Trigeminal neuralgia  PAST SURGICAL HISTORY: Past Surgical History:  Procedure Laterality Date   CAROTID ENDARTERECTOMY     COLONOSCOPY  March 12, 2006   Had 2 polyps removed; one was an adenoma and the other one was hyperplastic   COLONOSCOPY N/A 10/09/2012   Procedure: COLONOSCOPY;  Surgeon: Rogene Houston, MD;  [facility];  Service: Endoscopy;  Laterality: N/A;  1030-rescheduled to 12:00pm per [name]   ENDARTERECTOMY  06/10/2012   Procedure: ENDARTERECTOMY CAROTID;  Surgeon: Rosetta Posner, MD;  Location: [facility];  Service: Vascular;  Laterality: Right;  With Dacron Patch Angioplasty   EYE SURGERY Right 10/2015   cataract   HERNIA REPAIR  age 29   HYDROCELE EXCISION  2011   stents     varicose removal from scrotum  1982   VASECTOMY  1989    FAMILY HISTORY: Family History  Problem Relation Age of Onset   Diabetes Mother    Hypertension Mother    Heart disease Mother        Before age 3   Hyperlipidemia  Mother    Heart attack Father    Lung cancer Father    Cancer Father        Lung   Heart disease Father        Before age 30   Hypertension Son     SOCIAL HISTORY:  Social History   Socioeconomic History   Marital status: Married    Spouse name: Timofey Carandang   Number of children: 2   Years of education: 41yrs   Highest education level: Not on file  Occupational History    Employer: [employer]    Comment: [employer]  Tobacco Use   Smoking status: Former    Packs/day: 2.00    Years: 20.00    Pack years: 40.00    Types: Cigarettes    Start date: 05/23/1968    Quit date: 08/21/1993    Years since quitting: 27.7   Smokeless tobacco: Never  Substance and Sexual Activity   Alcohol use: No    Alcohol/week: 0.0 standard drinks   Drug use: No   Sexual activity: Not on file  Other Topics Concern   Not on file  Social History Narrative   Full time. Married. Works at [employer].    Pt lives at home with his spouse.   Caffeine Use: none   Social Determinants of Radio broadcast assistant Strain: Not on file  Food Insecurity: Not on file  Transportation Needs: Not on file  Physical Activity: Not on file  Stress: Not on file  Social Connections: Not on file  Intimate Partner Violence: Not on file     PHYSICAL EXAM  There were no vitals filed for this visit.  Wt Readings from Last 3 Encounters:  05/08/19 240 lb (108.9 kg)  04/21/19 241 lb 9.6 oz (109.6 kg)  05/16/18 237 lb (107.5 kg)   There is no height or weight on file to calculate BMI.  No results found.  MMSE - Mini Mental State Exam 07/14/2014  Orientation to time 5  Orientation to Place 5  Registration 3  Attention/ Calculation 5  Recall 1  Language- name 2 objects 2  Language- repeat 1  Language- follow 3 step command 3  Language- read & follow direction 1  Write a sentence 1  Copy design 1  Total score 28    GENERAL EXAM: Patient is in no distress; well developed,  nourished and  groomed; neck is supple  VIDEO VISIT  DIAGNOSTIC DATA (LABS, IMAGING, TESTING) - I reviewed patient records, labs, notes, testing and imaging myself where available.  Lab Results  Component Value Date   WBC 4.9 04/16/2018   HGB 15.5 04/16/2018   HCT 44.2 04/16/2018   MCV 93 04/16/2018   PLT 165 04/16/2018      Component Value Date/Time   NA 137 04/16/2018 1352   K 4.6 04/16/2018 1352   CL 101 04/16/2018 1352   CO2 21 04/16/2018 1352   GLUCOSE 84 04/16/2018 1352   GLUCOSE 93 07/03/2015 1641   BUN 14 04/16/2018 1352   CREATININE 1.16 04/16/2018 1352   CALCIUM 9.9 04/16/2018 1352   PROT 7.4 04/16/2018 1352   ALBUMIN 4.8 04/16/2018 1352   AST 57 (H) 04/16/2018 1352   ALT 47 (H) 04/16/2018 1352   ALKPHOS 37 (L) 04/16/2018 1352   BILITOT 0.5 04/16/2018 1352   GFRNONAA 66 04/16/2018 1352   GFRAA 77 04/16/2018 1352   No results found for: CHOL, HDL, LDLCALC, LDLDIRECT, TRIG, CHOLHDL Lab Results  Component Value Date   HGBA1C 5.4 01/17/2013   Lab Results  Component Value Date   VITAMINB12 302 07/14/2014   Lab Results  Component Value Date   TSH 2.320 01/17/2013   Vit D, 25-Hydroxy  Date Value Ref Range Status  01/04/2016 31.8 30.0 - 100.0 ng/mL Final    Comment:    Vitamin D deficiency has been defined by the Institute of Medicine and an Endocrine Society practice guideline as a level of serum 25-OH vitamin D less than 20 ng/mL (1,2). The Endocrine Society went on to further define vitamin D insufficiency as a level between 21 and 29 ng/mL (2). 1. IOM (Institute of Medicine). 2010. Dietary reference    intakes for calcium and D. [place]: The    [employer]. 2. Holick MF, Binkley Granby, Bischoff-Ferrari HA, et al.    Evaluation, treatment, and prevention of vitamin D    deficiency: an Endocrine Society clinical practice    guideline. JCEM. 2011 Jul; 96(7):1911-30.    Lymphocytes Absolute  Date Value Ref Range Status  04/16/2018  0.9 0.7 - 3.1 x10E3/uL Final  01/04/2016 0.9 0.7 - 3.1 x10E3/uL Final  01/05/2015 0.8 0.7 - 3.1 x10E3/uL Final    02/06/13 MRI brain (with and without) demonstrating: 1. Multiple periventricular and subcortical chronic demyelinating plaques. Chronic plaque noted in the right medulla. 2. No acute plaques. 3. No change from MRI on 04/02/12.  02/06/13 MRI thoracic spine (with and without) demonstrating: 1. Multiple chronic demyelinating plaques at T5, T6, T7 and T9-10. 2. No acute plaques. 3. No change from MRI on 04/02/12.  10/09/16 MRI brain  1. Stable compared to 07/26/2015.  No explanation for new face pain. 2. Multiple sclerosis with stable plaque burden and no signs of active demyelination. There is a plaque near the left trigeminal root entry zone, but stable. 3. Normal brain volume.  01/06/15 JCV ab - 1.33 H positive    ASSESSMENT AND PLAN  70 y.o. year old male here with multiple sclerosis and prior TIA.  MS --> since 1989; on betaseron 1995-2000; now doing well on tecfidera since Dec 2013. He reports being steroid intolerant in past (hallucinations, stomach irritation, weight gain).   TIA --> due to hypertension, hypercholesterolemia. Had right brain TIA on 04/08/12, with right ICA stenosis, s/p right CEA 06/10/12.    Dx:  No diagnosis found.    Virtual Visit via Video Note  I connected with Grant Valdez on 05/17/21 at  9:30 AM EDT by a video enabled telemedicine application and verified that I am speaking with the correct person using two identifiers.   I discussed the limitations of evaluation and management by telemedicine and the availability of in person appointments. The patient expressed understanding and agreed to proceed.  Patient is at their home. I am at the office.    I discussed the assessment and treatment plan with the patient. The patient was provided an opportunity to ask questions and all were answered. The patient agreed with the plan and  demonstrated an understanding of the instructions.   The patient was advised to call back or seek an in-person evaluation if the symptoms worsen or if the condition fails to improve as anticipated.  I provided 20 minutes of non-face-to-face time during this encounter.   PLAN:  MULTIPLE SCLEROSIS DISEASE MODIFYING THERAPY (stable) - continue vumerity for multiple sclerosis - annual CBC, CMP per PCP  LEFT FACIAL PAIN / TRIGEMINAL NEURALGIA (stable) - continue CBZ 500mg  twice a day   STROKE PREVENTION - continue stroke prevention with aggrenox, statin, BP control per PCP  Meds ordered this encounter  Medications   Diroximel Fumarate (VUMERITY) 231 MG CPDR    Sig: Take 462 mg by mouth in the morning and at bedtime.    Dispense:  360 capsule    Refill:  4    Patient assistance program   carbamazepine (TEGRETOL XR) 100 MG 12 hr tablet    Sig: Take 5 tablets (500 mg total) by mouth 2 (two) times daily.    Dispense:  300 tablet    Refill:  12    Return in about 1 year (around 05/17/2022) for MyChart visit (30min).      Penni Bombard, MD 3/00/5110, 21:11 AM Certified in Neurology, Neurophysiology and Neuroimaging  [facility] [address] [place], [place] [address] [phone]

## 2021-05-19 ENCOUNTER — Telehealth: Payer: Self-pay | Admitting: *Deleted

## 2021-05-19 NOTE — Telephone Encounter (Signed)
Received annual lab results from PCP. Carbamazepine level therapeutic at 8.2. Results placed on MD desk for review.

## 2021-10-11 ENCOUNTER — Telehealth: Payer: Self-pay | Admitting: Diagnostic Neuroimaging

## 2021-10-11 NOTE — Telephone Encounter (Signed)
May increase to carbamazepine to 600mg  twice a day. Also may consider adding gabapentin 300mg  twice a day. -VRP

## 2021-10-11 NOTE — Telephone Encounter (Signed)
Called Grant Valdez and advised of Dr [employer] recommendations, advised he trial a week or so and call back. If tolerating and pain is improved a new Rx will be sent in. Advised that if not, he can consider adding Gabapentin.  She verbalized understanding, appreciation.

## 2021-10-11 NOTE — Telephone Encounter (Signed)
Called Grant Valdez who stated he was doing well until recently but in past 3 weeks having more trigeminal nerve pain. He's unable to touch his face without having shooting pain. He asked if carbamazepine can be increased. I advised will discuss with Dr Leta Baptist and call her back. She verbalized understanding, appreciation.

## 2021-10-11 NOTE — Telephone Encounter (Signed)
Pt's wife is asking for a call from RN to discuss an increase in pt's carbamazepine (TEGRETOL XR) 100 MG 12 hr tablet

## 2022-03-16 ENCOUNTER — Telehealth: Payer: Self-pay | Admitting: Diagnostic Neuroimaging

## 2022-03-16 MED ORDER — GABAPENTIN 300 MG PO CAPS: 300.0000 mg | ORAL_CAPSULE | Freq: Two times a day (BID) | ORAL | 5 refills | Status: DC

## 2022-03-16 NOTE — Telephone Encounter (Signed)
Called patient and updated pharmacy to [facility] [place]. I informed him the Rx will be sent today. Requested he let us know if it is not effective. Patient verbalized understanding, appreciation.

## 2022-03-16 NOTE — Telephone Encounter (Signed)
Called patient and advised he needs VV FU. We scheduled his 1 year FU. He is taking carbamazepine 600 mg twice daily per Dr Gladstone Lighter recommendation in Feb. He stated the facial pain is now shooting out like fireworks. I advised per MD note in Feb he may add gabapentin. I advised will send to MD and let him know via my chart. Patient verbalized understanding, appreciation.

## 2022-03-16 NOTE — Telephone Encounter (Signed)
Pt is calling requesting to speak to a nurse because his TN nerve is acting up. Pt want to know if he can up his dose on the carbamazepine (TEGRETOL XR) 100 MG 12 hr tablet.

## 2022-03-16 NOTE — Telephone Encounter (Signed)
Start gabapentin '300mg'$  twice a day.  Please find out which pharmacy.  Thanks, VRP

## 2022-04-03 ENCOUNTER — Encounter: Payer: Self-pay | Admitting: Diagnostic Neuroimaging

## 2022-05-02 ENCOUNTER — Telehealth (INDEPENDENT_AMBULATORY_CARE_PROVIDER_SITE_OTHER): Payer: Medicare Other | Admitting: Diagnostic Neuroimaging

## 2022-05-02 ENCOUNTER — Encounter: Payer: Self-pay | Admitting: Diagnostic Neuroimaging

## 2022-05-02 DIAGNOSIS — G35 Multiple sclerosis: Secondary | ICD-10-CM

## 2022-05-02 MED ORDER — GABAPENTIN 300 MG PO CAPS: 600.0000 mg | ORAL_CAPSULE | Freq: Two times a day (BID) | ORAL | 4 refills | Status: DC

## 2022-05-02 MED ORDER — CARBAMAZEPINE ER 200 MG PO TB12: 600.0000 mg | ORAL_TABLET | Freq: Two times a day (BID) | ORAL | 4 refills | Status: DC

## 2022-05-02 NOTE — Progress Notes (Signed)
[facility]  PATIENT: Grant Valdez DOB: Sep 26, 1950  REFERRING CLINICIAN:  HISTORY FROM: patient  REASON FOR VISIT: follow up   HISTORICAL  CHIEF COMPLAINT:  Chief Complaint  Patient presents with   Multiple Sclerosis     HISTORY OF PRESENT ILLNESS:   UPDATE (05/02/22, VRP): Since last visit, doing well. Symptoms are stable. No alleviating or aggravating factors. Tolerating meds. Doing well on vumerity.  UPDATE (05/17/21, VRP): Since last visit, doing well. Now living in [place], [place]. Tolerating vumerity. TN sxs are stable. Had labs via new PCP.   UPDATE (04/21/19, VRP): Since last visit, doing fair. TN sxs stable on CBZ '500mg'$  twice a day. Tecfidera stable. Symptoms are stable. No alleviating or aggravating factors.    UPDATE (07/17/17, VRP): Since last visit, doing well. Tolerating tecfidera. No alleviating or aggravating factors.   UPDATE 01/09/17: Since last visit, left facial pain improved. Tolerating CBZ. Now on CBZ '100mg'$  BID. No new MS symptoms.  UPDATE 10/04/16: Since last visit, was doing well until yesterday; now having new onset of left facial pain, intermittent, shooting electrical pain, from left lower jaw up to left eye. Triggered by brushing teeth, licking lip or touching. Multiple attacks per day. Tolerating tecfidera.   UPDATE 01/04/16: Since last visit, doing about the same. No new sxs. Tolerating tecfidera.   UPDATE 07/07/15: No new issues. Tolerating meds. Overall doing well.  UPDATE 01/05/15: Since last visit, overall stable. Still notes some balance difficulty. He is trying to increase his walking back up to 2 miles per day. Tolerating meds. No new issues.  UPDATE 07/14/14 (LL): Patient failed to follow up for a year, was supposed to follow up in 3 months after last visit.  He did not want to do PT. He has been well on Tecfidera with only flushing side effect. He presents today to have labs and get refills for his  medication. Has fallen 2-3 times in the last year, no injuries. Complaints of bilateral finger numbness and paresthesias, slower cognitive processing time, fatigue. MMSE 28/30, AFT 18.  UPDATE 06/17/13: Patient presents for followup. Since last visit he has fallen 4 times including breaking his left foot, almost 4 months ago. Otherwise patient is stable. He still has issues with fatigue depression anxiety.  UPDATE 01/17/13: patient returns for urgent followup. Over past 3 weeks, patient has had increasing dizziness, balance difficulty, urination control problems. He feels some additional numbness or weakness in his legs. No difficulty with his hands or arms. No vision changes, slurred speech, trouble talking. Wife notes some more mood swings, irritability, depression, anxiety, attention and focus problems. He also has had more difficulty with hearing.  UPDATE 09/30/12: Since last visit, doing well. Started tecfidera on Jul 21, 2012. Initially flushing and GI sxs, but have eased up. No new MS flares or sxs.  UPDATE 06/19/12: S/p right CEA on 06/10/12. No complications. No new stroke/TIA symptoms. Will proceed with MS treatment.   UPDATE 05/01/12: Since last visit, had MRI brain, c and t spine, confirming chronic MS. On 04/08/12, had sudden onset dizziness, seeing floaters, and left face arm leg numbness x 10 minutes. Didn't tell anyone for 4 days. Had full recovery. Today carotid u/s shows right ICA stenosis (50-69%). Also with fatigue, malaise. Thinking about disability.  PRIOR HPI: 71 year old right-handed male with history of hypertension, hypercholesteremia, coronary artery disease, depression, anxiety, multiple sclerosis, TIA, here for evaluation and management of MS. In 1989, patient developed numbness spots in the back.  He was evaluated by Dr. Rosiland Oz ([place]), with MRI and lumbar puncture, and given a diagnosis of MS. We do not have those records to review today. 2 years later he transferred care to Dr.  Aggie Hacker [facility]), who treated him with prednisone. Over these years she developed some staggering, balance difficulty, vision problems. In 1995 he was started on Betaseron. Over the next 10-15 years he was on and off of Betaseron, do to fluctuation in liver function testing. Patient last saw Dr. Aggie Hacker in 2011. He saw a different neurologist at [facility] in 2012. Since that time he has not seen a neurologist. His last MRI was around 2000 102,011, which is apparently stable. He weaned himself off of Betaseron in 2011 because he felt that his symptoms weren't fairly mild.  In 2013 he is started to develop increasing fatigue, balance difficulty, numbness in his hands and feet. Also having trouble with coordination.  REVIEW OF SYSTEMS: Full 14 system review of systems performed and negative except: as per HPI.    ALLERGIES: No Known Allergies  HOME MEDICATIONS: Outpatient Medications Prior to Visit  Medication Sig Dispense Refill   buPROPion (WELLBUTRIN XL) 300 MG 24 hr tablet Take 200 mg by mouth daily.      Choline Fenofibrate (FENOFIBRIC ACID) 135 MG CPDR Take 1 capsule by mouth daily.   0   citalopram (CELEXA) 20 MG tablet Take 20 mg by mouth daily.  0   dipyridamole-aspirin (AGGRENOX) 200-25 MG 12hr capsule Take 1 capsule by mouth 2 (two) times daily.  0   Diroximel Fumarate (VUMERITY) 231 MG CPDR Take 462 mg by mouth in the morning and at bedtime. 360 capsule 4   LORazepam (ATIVAN) 0.5 MG tablet Take 0.5 mg by mouth daily. nerves     metoprolol (LOPRESSOR) 50 MG tablet Take 25 mg by mouth 2 (two) times daily.      Multiple Vitamin (MULTIVITAMIN WITH MINERALS) TABS tablet Take 1 tablet by mouth daily.     nitroGLYCERIN (NITROSTAT) 0.4 MG SL tablet Place 1 tablet (0.4 mg total) under the tongue every 5 (five) minutes as needed for chest pain. 25 tablet 3   Omega-3 Fatty Acids (FISH OIL) 1000 MG CAPS Take 1 capsule by mouth 2 (two) times daily.      omeprazole (PRILOSEC) 20 MG  capsule Take 20 mg by mouth daily.       rosuvastatin (CRESTOR) 20 MG tablet TAKE 1 TABLET BY MOUTH ONCE DAILY (STOP PRAVASTATIN)     carbamazepine (TEGRETOL XR) 100 MG 12 hr tablet Take 5 tablets (500 mg total) by mouth 2 (two) times daily. 300 tablet 12   gabapentin (NEURONTIN) 300 MG capsule Take 1 capsule (300 mg total) by mouth 2 (two) times daily. 60 capsule 5   No facility-administered medications prior to visit.    PAST MEDICAL HISTORY: Past Medical History:  Diagnosis Date   Anxiety    CAD (coronary artery disease)    had 2 arteries tented four years ago sees Dr. Dannielle Burn   Carotid artery occlusion    Depression    Diabetes mellitus without complication ([place])    GERD (gastroesophageal reflux disease)    Headache(784.0)    migraines hx of   HLD (hyperlipidemia)    HTN (hypertension)    Sees Dr. Woody Seller   MS (multiple sclerosis) ([place])    diagnosed in 1989. He was treated with prednisone followed by betaseron which was gradually tapered because his LFTs went up and it resolved off therapy.  Likely he is remained in remission for years   Neuromuscular disorder [facility])    has Multiple Sclerosis, sees Dr. Leta Baptist   S/P right inguinal herniorrhaphy    Stroke [facility])    "patient states has had several TIA's, last one 05/27/12"   TIA (transient ischemic attack)    Trigeminal neuralgia     PAST SURGICAL HISTORY: Past Surgical History:  Procedure Laterality Date   CAROTID ENDARTERECTOMY     COLONOSCOPY  March 12, 2006   Had 2 polyps removed; one was an adenoma and the other one was hyperplastic   COLONOSCOPY N/A 10/09/2012   Procedure: COLONOSCOPY;  Surgeon: Rogene Houston, MD;  [facility];  Service: Endoscopy;  Laterality: N/A;  1030-rescheduled to 12:00pm per [name]   ENDARTERECTOMY  06/10/2012   Procedure: ENDARTERECTOMY CAROTID;  Surgeon: Rosetta Posner, MD;  Location: [place];  Service: Vascular;  Laterality: Right;  With Dacron Patch Angioplasty   EYE SURGERY Right 10/2015    cataract   HERNIA REPAIR  age 22   HYDROCELE EXCISION  2011   stents     varicose removal from scrotum  1982   VASECTOMY  1989    FAMILY HISTORY: Family History  Problem Relation Age of Onset   Diabetes Mother    Hypertension Mother    Heart disease Mother        Before age 29   Hyperlipidemia Mother    Heart attack Father    Lung cancer Father    Cancer Father        Lung   Heart disease Father        Before age 74   Hypertension Son     SOCIAL HISTORY:  Social History   Socioeconomic History   Marital status: Married    Spouse name: Isabella Ida   Number of children: 2   Years of education: 35yr   Highest education level: Not on file  Occupational History    Employer: [employer]    Comment: [employer]  Tobacco Use   Smoking status: Former    Packs/day: 2.00    Years: 20.00    Total pack years: 40.00    Types: Cigarettes    Start date: 05/23/1968    Quit date: 08/21/1993    Years since quitting: 28.7   Smokeless tobacco: Never  Substance and Sexual Activity   Alcohol use: No    Alcohol/week: 0.0 standard drinks of alcohol   Drug use: No   Sexual activity: Not on file  Other Topics Concern   Not on file  Social History Narrative   Full time. Married. Works at [employer]    Pt lives at home with his spouse.   Caffeine Use: none   Social Determinants of HRadio broadcast assistantStrain: Not on file  Food Insecurity: Not on file  Transportation Needs: Not on file  Physical Activity: Not on file  Stress: Not on file  Social Connections: Not on file  Intimate Partner Violence: Not on file     PHYSICAL EXAM  There were no vitals filed for this visit.  Wt Readings from Last 3 Encounters:  05/08/19 240 lb (108.9 kg)  04/21/19 241 lb 9.6 oz (109.6 kg)  05/16/18 237 lb (107.5 kg)   There is no height or weight on file to calculate BMI.  No results found.     07/14/2014    2:19 PM  MMSE - Mini Mental State Exam  Orientation to time 5  Orientation to Place 5  Registration 3  Attention/ Calculation 5  Recall 1  Language- name 2 objects 2  Language- repeat 1  Language- follow 3 step command 3  Language- read & follow direction 1  Write a sentence 1  Copy design 1  Total score 28    GENERAL EXAM: Patient is in no distress; well developed, nourished and groomed; neck is supple  VIDEO VISIT  DIAGNOSTIC DATA (LABS, IMAGING, TESTING) - I reviewed patient records, labs, notes, testing and imaging myself where available.  Lab Results  Component Value Date   WBC 4.9 04/16/2018   HGB 15.5 04/16/2018   HCT 44.2 04/16/2018   MCV 93 04/16/2018   PLT 165 04/16/2018      Component Value Date/Time   NA 137 04/16/2018 1352   K 4.6 04/16/2018 1352   CL 101 04/16/2018 1352   CO2 21 04/16/2018 1352   GLUCOSE 84 04/16/2018 1352   GLUCOSE 93 07/03/2015 1641   BUN 14 04/16/2018 1352   CREATININE 1.16 04/16/2018 1352   CALCIUM 9.9 04/16/2018 1352   PROT 7.4 04/16/2018 1352   ALBUMIN 4.8 04/16/2018 1352   AST 57 (H) 04/16/2018 1352   ALT 47 (H) 04/16/2018 1352   ALKPHOS 37 (L) 04/16/2018 1352   BILITOT 0.5 04/16/2018 1352   GFRNONAA 66 04/16/2018 1352   GFRAA 77 04/16/2018 1352   No results found for: "CHOL", "HDL", "LDLCALC", "LDLDIRECT", "TRIG", "CHOLHDL" Lab Results  Component Value Date   HGBA1C 5.4 01/17/2013   Lab Results  Component Value Date   VITAMINB12 302 07/14/2014   Lab Results  Component Value Date   TSH 2.320 01/17/2013   Vit D, 25-Hydroxy  Date Value Ref Range Status  01/04/2016 31.8 30.0 - 100.0 ng/mL Final    Comment:    Vitamin D deficiency has been defined by the Institute of Medicine and an Endocrine Society practice guideline as a level of serum 25-OH vitamin D less than 20 ng/mL (1,2). The Endocrine Society went on to further define vitamin D insufficiency as a level between 21 and 29 ng/mL (2). 1. IOM (Institute of Medicine). 2010. Dietary reference     intakes for calcium and D. Lewis: The    [employer]. 2. Holick MF, Binkley Brogden, Bischoff-Ferrari HA, et al.    Evaluation, treatment, and prevention of vitamin D    deficiency: an Endocrine Society clinical practice    guideline. JCEM. 2011 Jul; 96(7):1911-30.    Lymphocytes Absolute  Date Value Ref Range Status  04/16/2018 0.9 0.7 - 3.1 x10E3/uL Final  01/04/2016 0.9 0.7 - 3.1 x10E3/uL Final  01/05/2015 0.8 0.7 - 3.1 x10E3/uL Final    02/06/13 MRI brain (with and without) demonstrating: 1. Multiple periventricular and subcortical chronic demyelinating plaques. Chronic plaque noted in the right medulla. 2. No acute plaques. 3. No change from MRI on 04/02/12.  02/06/13 MRI thoracic spine (with and without) demonstrating: 1. Multiple chronic demyelinating plaques at T5, T6, T7 and T9-10. 2. No acute plaques. 3. No change from MRI on 04/02/12.  10/09/16 MRI brain  1. Stable compared to 07/26/2015.  No explanation for new face pain. 2. Multiple sclerosis with stable plaque burden and no signs of active demyelination. There is a plaque near the left trigeminal root entry zone, but stable. 3. Normal brain volume.  01/06/15 JCV ab - 1.33 H positive    ASSESSMENT AND PLAN  71 y.o. year old male here with multiple  sclerosis and prior TIA.  MS --> since 1989; on betaseron 1995-2000; now doing well on tecfidera since Dec 2013. He reports being steroid intolerant in past (hallucinations, stomach irritation, weight gain).   TIA --> due to hypertension, hypercholesterolemia. Had right brain TIA on 04/08/12, with right ICA stenosis, s/p right CEA 06/10/12.    Dx:  Multiple sclerosis ([place]) - Plan: MR BRAIN WO CONTRAST    Virtual Visit via Video Note  I connected with Grant Valdez on 05/02/22 at  2:30 PM EDT by a video enabled telemedicine application and verified that I am speaking with the correct person using two identifiers.   I discussed the limitations of  evaluation and management by telemedicine and the availability of in person appointments. The patient expressed understanding and agreed to proceed.  Patient is at their home. I am at the office.    I discussed the assessment and treatment plan with the patient. The patient was provided an opportunity to ask questions and all were answered. The patient agreed with the plan and demonstrated an understanding of the instructions.   The patient was advised to call back or seek an in-person evaluation if the symptoms worsen or if the condition fails to improve as anticipated.  I provided 15 minutes of non-face-to-face time during this encounter.   PLAN:  MULTIPLE SCLEROSIS DISEASE MODIFYING THERAPY (stable) - continue vumerity for multiple sclerosis - annual CBC, CMP per PCP - consider MRI (will try to get locally where patient lives)  LEFT FACIAL PAIN / TRIGEMINAL NEURALGIA (stable) - continue CBZ '600mg'$  twice a day - continue gabapentin '600mg'$  twice a day    STROKE PREVENTION - continue stroke prevention with aggrenox, statin, BP control per PCP  Meds ordered this encounter  Medications   gabapentin (NEURONTIN) 300 MG capsule    Sig: Take 2 capsules (600 mg total) by mouth 2 (two) times daily.    Dispense:  360 capsule    Refill:  4   carbamazepine (TEGRETOL XR) 200 MG 12 hr tablet    Sig: Take 3 tablets (600 mg total) by mouth 2 (two) times daily.    Dispense:  540 tablet    Refill:  4   Return in about 1 year (around 05/03/2023).      Penni Bombard, MD 3/54/6568, 1:27 PM Certified in Neurology, Neurophysiology and Neuroimaging  [facility] [address], [place], [place] [address] [phone]

## 2022-05-11 ENCOUNTER — Telehealth: Payer: Self-pay | Admitting: Diagnostic Neuroimaging

## 2022-05-11 DIAGNOSIS — G35 Multiple sclerosis: Secondary | ICD-10-CM

## 2022-05-11 DIAGNOSIS — R519 Headache, unspecified: Secondary | ICD-10-CM

## 2022-05-11 NOTE — Telephone Encounter (Signed)
Since the patient just had an increase in both the Tegretol and the gabapentin, I would not recommend any additional medications quite yet. All in all, he is taking multiple other medications and I do not feel comfortable adjusting medications quite yet as it has been less than 2 weeks since the increase in his medications.  I would give it some more time.  I would recommend we await additional recommendations upon Dr. Gladstone Lighter return.

## 2022-05-11 NOTE — Telephone Encounter (Signed)
Pt's wife, Grant Valdez (no DPR on file) having pain from forehead to chin for a few second. Have pressure in eyeball. Last night pain lasted 45 minutes. Taking Gabpentin, Tylenol, IbWould like a call from the nurse.

## 2022-05-11 NOTE — Telephone Encounter (Signed)
Contacted pt back, he stated he has been having trigeminal neuralgia pain for two days now and its getting worse since VV on 05/02/22.Marland Kitchen Pt recently had a increase in Gabapentin to '600mg'$  twice a day and Tegretol XR to '600mg'$  twice a day. Dr Leta Baptist wanted to trial increase before a new Rx but increase is currently not relieving symptoms. As work in [place], what do you advise for the pain?

## 2022-05-11 NOTE — Telephone Encounter (Signed)
Contacted pt back, gave him Dr Tori Milks recommendations. He did not want to wait for Dr Leta Baptist to return as he is in pain now. I advised him to try some home remedies such as warm/cold compress but be aware of triggers and try to avoid them in the meantime. Pt understood and will wait for Dr Leta Baptist return. Please advise

## 2022-05-16 ENCOUNTER — Telehealth: Payer: Self-pay | Admitting: Diagnostic Neuroimaging

## 2022-05-16 NOTE — Telephone Encounter (Signed)
BCBS medicare Grant Valdez: [id number] exp. 05/15/22-06/13/22 sent to [place] at [facility] in [place] [phone]

## 2022-05-17 ENCOUNTER — Other Ambulatory Visit: Payer: Self-pay | Admitting: Diagnostic Neuroimaging

## 2022-05-17 DIAGNOSIS — G35 Multiple sclerosis: Secondary | ICD-10-CM

## 2022-05-17 NOTE — Telephone Encounter (Signed)
Pt recently had a increase, what else do you recommend ?

## 2022-05-18 MED ORDER — BACLOFEN 10 MG PO TABS: 5.0000 mg | ORAL_TABLET | Freq: Two times a day (BID) | ORAL | 6 refills | Status: DC

## 2022-05-18 MED ORDER — PREDNISONE 10 MG PO TABS: ORAL_TABLET | ORAL | 0 refills | Status: DC

## 2022-05-18 MED ORDER — HYDROCODONE-ACETAMINOPHEN 5-325 MG PO TABS: 1.0000 | ORAL_TABLET | Freq: Three times a day (TID) | ORAL | 0 refills | Status: DC | PRN

## 2022-05-18 NOTE — Addendum Note (Signed)
Addended by: Andrey Spearman R on: 05/18/2022 09:01 AM   Modules accepted: Orders

## 2022-05-18 NOTE — Telephone Encounter (Signed)
Also start prednisone pack for possible MS flare.   Meds ordered this encounter  Medications   baclofen (LIORESAL) 10 MG tablet    Sig: Take 0.5-1 tablets (5-10 mg total) by mouth 2 (two) times daily.    Dispense:  30 each    Refill:  6   HYDROcodone-acetaminophen (NORCO/VICODIN) 5-325 MG tablet    Sig: Take 1 tablet by mouth every 8 (eight) hours as needed for moderate pain.    Dispense:  30 tablet    Refill:  0   predniSONE (DELTASONE) 10 MG tablet    Sig: Take '60mg'$  on day 1. Reduce by '10mg'$  each subsequent day. (60, 50, 40, 30, 20, 10, stop)    Dispense:  21 tablet    Refill:  0    Penni Bombard, MD 2/53/6644, 0:34 AM Certified in Neurology, Neurophysiology and Neuroimaging  [facility] [address], [place], [place] [address] [phone]

## 2022-05-18 NOTE — Telephone Encounter (Signed)
Start baclofen 5-'10mg'$  twice a day.  Start hydrocodone as needed q8hours prn pain.  Change MRI orders. (Cancel old order without contrast. New MRI brain w/wo and MRI trigeminal w/wo)  Meds ordered this encounter  Medications   baclofen (LIORESAL) 10 MG tablet    Sig: Take 0.5-1 tablets (5-10 mg total) by mouth 2 (two) times daily.    Dispense:  30 each    Refill:  6   HYDROcodone-acetaminophen (NORCO/VICODIN) 5-325 MG tablet    Sig: Take 1 tablet by mouth every 8 (eight) hours as needed for moderate pain.    Dispense:  30 tablet    Refill:  0   Orders Placed This Encounter  Procedures   MR BRAIN W WO CONTRAST   MR FACE/TRIGEMINAL WO/W CM    Penni Bombard, MD 11/21/4740, 5:95 AM Certified in Neurology, Neurophysiology and Neuroimaging  [facility] [address] [place], [place] [address] [phone]

## 2022-05-18 NOTE — Addendum Note (Signed)
Addended by: Andrey Spearman R on: 05/18/2022 08:59 AM   Modules accepted: Orders

## 2022-05-19 ENCOUNTER — Telehealth: Payer: Self-pay | Admitting: Diagnostic Neuroimaging

## 2022-05-19 ENCOUNTER — Encounter: Payer: Self-pay | Admitting: Diagnostic Neuroimaging

## 2022-05-19 NOTE — Telephone Encounter (Signed)
Pt is requesting a refill for Diroximel Fumarate (VUMERITY) 231 MG CPDR.  Pharmacy: [facility]

## 2022-05-22 ENCOUNTER — Telehealth: Payer: Self-pay | Admitting: Diagnostic Neuroimaging

## 2022-05-22 ENCOUNTER — Other Ambulatory Visit: Payer: Self-pay | Admitting: *Deleted

## 2022-05-22 DIAGNOSIS — G35 Multiple sclerosis: Secondary | ICD-10-CM

## 2022-05-22 MED ORDER — VUMERITY 231 MG PO CPDR: 462.0000 mg | DELAYED_RELEASE_CAPSULE | Freq: Two times a day (BID) | ORAL | 4 refills | Status: DC

## 2022-05-22 NOTE — Telephone Encounter (Signed)
Vumerity Rx sent as requested.

## 2022-05-22 NOTE — Telephone Encounter (Signed)
[facility]() Requesting MRI of brain with/without contrast.  Fax [phone]

## 2022-05-23 NOTE — Telephone Encounter (Signed)
Routed to Allen Kell, MRI coordinator with [facility] #.

## 2022-05-24 ENCOUNTER — Telehealth: Payer: Self-pay | Admitting: *Deleted

## 2022-05-24 NOTE — Telephone Encounter (Signed)
Received MRI brain results form [facility], on MD desk for review.

## 2022-05-25 NOTE — Telephone Encounter (Signed)
I faxed the orders

## 2022-05-29 NOTE — Telephone Encounter (Signed)
Called [facility] radiology, got phone # for medical records. Called medical records, requested MRI face report. She stated I need to fax request and they will send report. Request faxed to [phone].

## 2022-05-29 NOTE — Telephone Encounter (Signed)
Only received MRI brain report which MD has seen. Called [facility], had to LVM. Requested call back re: MRI face/trigeminal nerve.

## 2022-05-30 ENCOUNTER — Encounter: Payer: Self-pay | Admitting: *Deleted

## 2022-05-31 ENCOUNTER — Other Ambulatory Visit: Payer: Self-pay | Admitting: *Deleted

## 2022-05-31 MED ORDER — PREDNISONE 10 MG PO TABS: ORAL_TABLET | ORAL | 0 refills | Status: DC

## 2022-05-31 MED ORDER — HYDROCODONE-ACETAMINOPHEN 5-325 MG PO TABS: 1.0000 | ORAL_TABLET | Freq: Three times a day (TID) | ORAL | 0 refills | Status: DC | PRN

## 2022-05-31 NOTE — Telephone Encounter (Signed)
Patient messaged, he is scheduled for MRI face on 06/16/22 but he stated he was told it is without contrast. I have requested Tori, MRI coordinator call [facility] today to clarify. The order is with and without contrast.

## 2022-06-12 ENCOUNTER — Telehealth: Payer: Self-pay | Admitting: Diagnostic Neuroimaging

## 2022-06-12 NOTE — Telephone Encounter (Signed)
[facility] (Margie)Do not have authorization scheduled for MRI 10/27 with and without contrast Orbit base neck. CPT Code 79150  NPI: [id number] Tax ID [id number] [facility] [address], [place] [address]  We have to have authorization by noon 10/26 Tax ID and address needs to match for authorization.

## 2022-06-12 NOTE — Telephone Encounter (Signed)
The current auth expires 10/24 then I can do a new one, BCBS won't let me extend or withdraw the current one.

## 2022-06-14 NOTE — Telephone Encounter (Signed)
MR orbit BCBS Medicare auth: [id number] exp. 06/14/22-07/13/22 sent to [facility]

## 2022-06-19 NOTE — Telephone Encounter (Signed)
Received MRI head report dated 06/16/22 from [facility]. Placed on MD desk for review.

## 2022-06-20 ENCOUNTER — Encounter: Payer: Self-pay | Admitting: *Deleted

## 2022-06-21 MED ORDER — GABAPENTIN 300 MG PO CAPS: 600.0000 mg | ORAL_CAPSULE | Freq: Three times a day (TID) | ORAL | 4 refills | Status: DC

## 2022-06-21 NOTE — Telephone Encounter (Signed)
Increase gabapentin to '600mg'$  three times a day.  Meds ordered this encounter  Medications   gabapentin (NEURONTIN) 300 MG capsule    Sig: Take 2 capsules (600 mg total) by mouth 3 (three) times daily.    Dispense:  540 capsule    Refill:  [place], MD 10/25/9556, 3:16 PM Certified in Neurology, Neurophysiology and [facility] [address], [place], [place] [address] [phone]

## 2022-06-28 MED ORDER — GABAPENTIN 300 MG PO CAPS: 900.0000 mg | ORAL_CAPSULE | Freq: Three times a day (TID) | ORAL | 4 refills | Status: DC

## 2022-06-28 NOTE — Telephone Encounter (Signed)
Increase gabapentin to '900mg'$  three times a day. -VRP

## 2022-06-28 NOTE — Addendum Note (Signed)
Addended by: Andrey Spearman R on: 06/28/2022 05:28 PM   Modules accepted: Orders

## 2022-06-29 ENCOUNTER — Other Ambulatory Visit: Payer: Self-pay | Admitting: *Deleted

## 2022-06-29 MED ORDER — HYDROCODONE-ACETAMINOPHEN 5-325 MG PO TABS: 1.0000 | ORAL_TABLET | Freq: Three times a day (TID) | ORAL | 0 refills | Status: DC | PRN

## 2022-06-29 NOTE — Progress Notes (Signed)
Meds ordered this encounter  Medications   HYDROcodone-acetaminophen (NORCO/VICODIN) 5-325 MG tablet    Sig: Take 1 tablet by mouth every 8 (eight) hours as needed for moderate pain.    Dispense:  30 tablet    Refill:  0    Penni Bombard, MD 45/03/5928, 2:44 PM Certified in Neurology, Neurophysiology and Neuroimaging  [facility] [address], [place], [place] [address] [phone]

## 2022-07-03 ENCOUNTER — Other Ambulatory Visit: Payer: Self-pay

## 2022-07-03 ENCOUNTER — Other Ambulatory Visit: Payer: Self-pay | Admitting: Diagnostic Neuroimaging

## 2022-07-03 MED ORDER — GABAPENTIN 300 MG PO CAPS: 900.0000 mg | ORAL_CAPSULE | Freq: Three times a day (TID) | ORAL | 4 refills | Status: DC

## 2022-07-03 MED ORDER — HYDROCODONE-ACETAMINOPHEN 5-325 MG PO TABS: 1.0000 | ORAL_TABLET | Freq: Three times a day (TID) | ORAL | 0 refills | Status: DC | PRN

## 2022-07-03 NOTE — Telephone Encounter (Signed)
Pt stated "I've had a really bad weekend with the TN. The pain is almost unbearable. I can't hardly eat or drink. What do you think the next step would be ? Maybe surgery, it's really that bad "  He is taking gabapentin '900mg'$  three times a day as well as Hydrocodone 5-'325mg'$  every 8hrs as needed. What do you recommend next steps to be for TN ?

## 2022-07-03 NOTE — Progress Notes (Signed)
Meds ordered this encounter  Medications   HYDROcodone-acetaminophen (NORCO/VICODIN) 5-325 MG tablet    Sig: Take 1 tablet by mouth every 8 (eight) hours as needed for moderate pain.    Dispense:  30 tablet    Refill:  0   gabapentin (NEURONTIN) 300 MG capsule    Sig: Take 3 capsules (900 mg total) by mouth 3 (three) times daily.    Dispense:  810 capsule    Refill:  [place], MD 62/83/1517, 6:16 PM Certified in Neurology, Neurophysiology and [facility] [address] [place], [place] [address] [phone]

## 2022-07-03 NOTE — Telephone Encounter (Signed)
[facility] pharmacist called and LVM stating that the e-scribed prescription that was sent for the pt's HYDROcodone-acetaminophen (NORCO/VICODIN) 5-325 MG tablet was deleted from the system and they are wanting to know if it can be resent. Please advise.

## 2022-07-03 NOTE — Telephone Encounter (Signed)
Will resend to Dr Leta Baptist to sign and send for the pt again

## 2022-07-03 NOTE — Progress Notes (Signed)
Rx was sent to wrong pharmacy on 11/8. Please sent to correct pharmacy. [place] [id number].

## 2022-11-16 ENCOUNTER — Other Ambulatory Visit: Payer: Self-pay

## 2022-11-16 MED ORDER — BACLOFEN 10 MG PO TABS: 5.0000 mg | ORAL_TABLET | Freq: Two times a day (BID) | ORAL | 6 refills | Status: DC

## 2023-05-30 ENCOUNTER — Telehealth: Payer: Self-pay | Admitting: Neurology

## 2023-05-30 ENCOUNTER — Other Ambulatory Visit: Payer: Self-pay | Admitting: Diagnostic Neuroimaging

## 2023-05-30 DIAGNOSIS — G35 Multiple sclerosis: Secondary | ICD-10-CM

## 2023-05-30 NOTE — Telephone Encounter (Signed)
Pt has not been seen since video visit in 04/2022. Pt is coming up on needing to renew his biogens free drug and would benefit from a follow up. If stable and doing well can follow up with NP

## 2023-06-14 ENCOUNTER — Telehealth: Payer: Self-pay | Admitting: Adult Health

## 2023-06-14 NOTE — Progress Notes (Unsigned)
[facility]  PATIENT: Grant Valdez DOB: [birth year]  REFERRING CLINICIAN:  HISTORY FROM: patient  REASON FOR VISIT: follow up   HISTORICAL  CHIEF COMPLAINT:  No chief complaint on file.    HISTORY OF PRESENT ILLNESS:    Update 06/14/2023 JM: Doing well from MS standpoint, remains on Vumerity, tolerating well.        UPDATE (05/02/22, VRP): Since last visit, doing well. Symptoms are stable. No alleviating or aggravating factors. Tolerating meds. Doing well on vumerity.  UPDATE (05/17/21, VRP): Since last visit, doing well. Now living in [place], [place], near [place]. Tolerating vumerity. TN sxs are stable. Had labs via new PCP.   UPDATE (04/21/19, VRP): Since last visit, doing fair. TN sxs stable on CBZ 500mg  twice a day. Tecfidera stable. Symptoms are stable. No alleviating or aggravating factors.    UPDATE (07/17/17, VRP): Since last visit, doing well. Tolerating tecfidera. No alleviating or aggravating factors.   UPDATE 01/09/17: Since last visit, left facial pain improved. Tolerating CBZ. Now on CBZ 100mg  BID. No new MS symptoms.  UPDATE 10/04/16: Since last visit, was doing well until yesterday; now having new onset of left facial pain, intermittent, shooting electrical pain, from left lower jaw up to left eye. Triggered by brushing teeth, licking lip or touching. Multiple attacks per day. Tolerating tecfidera.   UPDATE 01/04/16: Since last visit, doing about the same. No new sxs. Tolerating tecfidera.   UPDATE 07/07/15: No new issues. Tolerating meds. Overall doing well.  UPDATE 01/05/15: Since last visit, overall stable. Still notes some balance difficulty. He is trying to increase his walking back up to 2 miles per day. Tolerating meds. No new issues.  UPDATE 07/14/14 (LL): Patient failed to follow up for a year, was supposed to follow up in 3 months after last visit.  He did not want to do PT. He has been well on Tecfidera with only flushing  side effect. He presents today to have labs and get refills for his medication. Has fallen 2-3 times in the last year, no injuries. Complaints of bilateral finger numbness and paresthesias, slower cognitive processing time, fatigue. MMSE 28/30, AFT 18.  UPDATE 06/17/13: Patient presents for followup. Since last visit he has fallen 4 times including breaking his left foot, almost 4 months ago. Otherwise patient is stable. He still has issues with fatigue depression anxiety.  UPDATE 01/17/13: patient returns for urgent followup. Over past 3 weeks, patient has had increasing dizziness, balance difficulty, urination control problems. He feels some additional numbness or weakness in his legs. No difficulty with his hands or arms. No vision changes, slurred speech, trouble talking. Wife notes some more mood swings, irritability, depression, anxiety, attention and focus problems. He also has had more difficulty with hearing.  UPDATE 09/30/12: Since last visit, doing well. Started tecfidera on Jul 21, 2012. Initially flushing and GI sxs, but have eased up. No new MS flares or sxs.  UPDATE 06/19/12: S/p right CEA on 06/10/12. No complications. No new stroke/TIA symptoms. Will proceed with MS treatment.   UPDATE 05/01/12: Since last visit, had MRI brain, c and t spine, confirming chronic MS. On 04/08/12, had sudden onset dizziness, seeing floaters, and left face arm leg numbness x 10 minutes. Didn't tell anyone for 4 days. Had full recovery. Today carotid u/s shows right ICA stenosis (50-69%). Also with fatigue, malaise. Thinking about disability.  PRIOR HPI: 72 year old right-handed male with history of hypertension, hypercholesteremia, coronary artery disease, depression, anxiety, multiple sclerosis, TIA, here  for evaluation and management of MS. In 1989, patient developed numbness spots in the back. He was evaluated by Dr. Noreene Filbert ([facility]), with MRI and lumbar puncture, and given a diagnosis of MS. We do not have those  records to review today. 2 years later he transferred care to Dr. Stoney Bang [facility]), who treated him with prednisone. Over these years she developed some staggering, balance difficulty, vision problems. In 1995 he was started on Betaseron. Over the next 10-15 years he was on and off of Betaseron, do to fluctuation in liver function testing. Patient last saw Dr. Stoney Bang in 2011. He saw a different neurologist at [facility] in 2012. Since that time he has not seen a neurologist. His last MRI was around 2000 102,011, which is apparently stable. He weaned himself off of Betaseron in 2011 because he felt that his symptoms weren't fairly mild.  In 2013 he is started to develop increasing fatigue, balance difficulty, numbness in his hands and feet. Also having trouble with coordination.  REVIEW OF SYSTEMS: Full 14 system review of systems performed and negative except: as per HPI.    ALLERGIES: No Known Allergies  HOME MEDICATIONS: Outpatient Medications Prior to Visit  Medication Sig Dispense Refill   baclofen (LIORESAL) 10 MG tablet Take 0.5-1 tablets (5-10 mg total) by mouth 2 (two) times daily. 30 each 6   buPROPion (WELLBUTRIN XL) 300 MG 24 hr tablet Take 200 mg by mouth daily.      carbamazepine (TEGRETOL XR) 200 MG 12 hr tablet Take 3 tablets (600 mg total) by mouth 2 (two) times daily. 540 tablet 4   Choline Fenofibrate (FENOFIBRIC ACID) 135 MG CPDR Take 1 capsule by mouth daily.   0   citalopram (CELEXA) 20 MG tablet Take 20 mg by mouth daily.  0   dipyridamole-aspirin (AGGRENOX) 200-25 MG 12hr capsule Take 1 capsule by mouth 2 (two) times daily.  0   Diroximel Fumarate (VUMERITY) 231 MG CPDR TAKE 2 CAPSULES (462 MG) BY MOUTH IN THE MORNING AND AT BEDTIME. 120 capsule 0   gabapentin (NEURONTIN) 300 MG capsule Take 3 capsules (900 mg total) by mouth 3 (three) times daily. 810 capsule 4   HYDROcodone-acetaminophen (NORCO/VICODIN) 5-325 MG tablet Take 1 tablet by mouth every 8 (eight)  hours as needed for moderate pain. 30 tablet 0   LORazepam (ATIVAN) 0.5 MG tablet Take 0.5 mg by mouth daily. nerves     metoprolol (LOPRESSOR) 50 MG tablet Take 25 mg by mouth 2 (two) times daily.      Multiple Vitamin (MULTIVITAMIN WITH MINERALS) TABS tablet Take 1 tablet by mouth daily.     nitroGLYCERIN (NITROSTAT) 0.4 MG SL tablet Place 1 tablet (0.4 mg total) under the tongue every 5 (five) minutes as needed for chest pain. 25 tablet 3   Omega-3 Fatty Acids (FISH OIL) 1000 MG CAPS Take 1 capsule by mouth 2 (two) times daily.      omeprazole (PRILOSEC) 20 MG capsule Take 20 mg by mouth daily.       predniSONE (DELTASONE) 10 MG tablet Take 60mg  on day 1. Reduce by 10mg  each subsequent day. (60, 50, 40, 30, 20, 10, stop) 21 tablet 0   rosuvastatin (CRESTOR) 20 MG tablet TAKE 1 TABLET BY MOUTH ONCE DAILY (STOP PRAVASTATIN)     No facility-administered medications prior to visit.    PAST MEDICAL HISTORY: Past Medical History:  Diagnosis Date   Anxiety    CAD (coronary artery disease)    had 2  arteries tented four years ago sees Dr. Andee Lineman   Carotid artery occlusion    Depression    Diabetes mellitus without complication (HCC)    GERD (gastroesophageal reflux disease)    Headache(784.0)    migraines hx of   HLD (hyperlipidemia)    HTN (hypertension)    Sees Dr. Sherril Croon   MS (multiple sclerosis) (HCC)    diagnosed in 1989. He was treated with prednisone followed by betaseron which was gradually tapered because his LFTs went up and it resolved off therapy. Likely he is remained in remission for years   Neuromuscular disorder [facility])    has Multiple Sclerosis, sees Dr. Marjory Lies   S/P right inguinal herniorrhaphy    Stroke [facility])    "patient states has had several TIA's, last one 05/27/12"   TIA (transient ischemic attack)    Trigeminal neuralgia     PAST SURGICAL HISTORY: Past Surgical History:  Procedure Laterality Date   CAROTID ENDARTERECTOMY     COLONOSCOPY  March 12, 2006   Had  2 polyps removed; one was an adenoma and the other one was hyperplastic   COLONOSCOPY N/A 10/09/2012   Procedure: COLONOSCOPY;  Surgeon: Malissa Hippo, MD;  Location: [facility];  Service: Endoscopy;  Laterality: N/A;  1030-rescheduled to 12:00pm per Dewayne Hatch   ENDARTERECTOMY  06/10/2012   Procedure: ENDARTERECTOMY CAROTID;  Surgeon: Larina Earthly, MD;  Location: [facility] OR;  Service: Vascular;  Laterality: Right;  With Dacron Patch Angioplasty   EYE SURGERY Right 10/2015   cataract   HERNIA REPAIR  age 28   HYDROCELE EXCISION  2011   stents     varicose removal from scrotum  1982   VASECTOMY  1989    FAMILY HISTORY: Family History  Problem Relation Age of Onset   Diabetes Mother    Hypertension Mother    Heart disease Mother        Before age 60   Hyperlipidemia Mother    Heart attack Father    Lung cancer Father    Cancer Father        Lung   Heart disease Father        Before age 88   Hypertension Son     SOCIAL HISTORY:  Social History   Socioeconomic History   Marital status: Married    Spouse name: Kiansh Andrzejewski   Number of children: 2   Years of education: 31yrs   Highest education level: Not on file  Occupational History    Employer: [employer]    Comment: [employer]  Tobacco Use   Smoking status: Former    Current packs/day: 0.00    Average packs/day: 2.0 packs/day for 25.2 years (50.5 ttl pk-yrs)    Types: Cigarettes    Start date: 05/23/1968    Quit date: 08/21/1993    Years since quitting: 29.8   Smokeless tobacco: Never  Substance and Sexual Activity   Alcohol use: No    Alcohol/week: 0.0 standard drinks of alcohol   Drug use: No   Sexual activity: Not on file  Other Topics Concern   Not on file  Social History Narrative   Full time. Married. Works at [employer].    Pt lives at home with his spouse.   Caffeine Use: none   Social Determinants of Health   Financial Resource Strain: Low Risk  (06/12/2023)   Received from [facility]   Overall Financial Resource Strain (CARDIA)    Difficulty  of Paying Living Expenses: Not hard at all  Food Insecurity: No Food Insecurity (06/12/2023)   Received from [facility]   Hunger Vital Sign    Worried About Running Out of Food in the Last Year: Never true    Ran Out of Food in the Last Year: Never true  Transportation Needs: No Transportation Needs (06/12/2023)   Received from [facility] - Transportation    Lack of Transportation (Medical): No    Lack of Transportation (Non-Medical): No  Physical Activity: Unknown (06/12/2023)   Received from [facility]   Exercise Vital Sign    Days of Exercise per Week: 0 days    Minutes of Exercise per Session: Not on file  Stress: No Stress Concern Present (06/12/2023)   Received from [facility] - Occupational Stress Questionnaire    Feeling of Stress : Not at all  Social Connections: Moderately Integrated (06/12/2023)   Received from [facility]   Social Network    How would you rate your social network (family, work, friends)?: Adequate participation with social networks  Intimate Partner Violence: Not At Risk (06/12/2023)   Received from [facility]   HITS    Over the last 12 months how often did your partner physically hurt you?: 1    Over the last 12 months how often did your partner insult you or talk down to you?: 1    Over the last 12 months how often did your partner threaten you with physical harm?: 1    Over the last 12 months how often did your partner scream or curse at you?: 1     PHYSICAL EXAM  There were no vitals filed for this visit.  Wt Readings from Last 3 Encounters:  05/08/19 240 lb (108.9 kg)  04/21/19 241 lb 9.6 oz (109.6 kg)  05/16/18 237 lb (107.5 kg)   There is no height or weight on file to calculate BMI.  No results found.     07/14/2014    2:19 PM  MMSE - Mini Mental State Exam  Orientation to time 5  Orientation  to Place 5  Registration 3  Attention/ Calculation 5  Recall 1  Language- name 2 objects 2  Language- repeat 1  Language- follow 3 step command 3  Language- read & follow direction 1  Write a sentence 1  Copy design 1  Total score 28    GENERAL EXAM: Patient is in no distress; well developed, nourished and groomed; neck is supple  VIDEO VISIT  DIAGNOSTIC DATA (LABS, IMAGING, TESTING) - I reviewed patient records, labs, notes, testing and imaging myself where available.  Lab Results  Component Value Date   WBC 4.9 04/16/2018   HGB 15.5 04/16/2018   HCT 44.2 04/16/2018   MCV 93 04/16/2018   PLT 165 04/16/2018      Component Value Date/Time   NA 137 04/16/2018 1352   K 4.6 04/16/2018 1352   CL 101 04/16/2018 1352   CO2 21 04/16/2018 1352   GLUCOSE 84 04/16/2018 1352   GLUCOSE 93 07/03/2015 1641   BUN 14 04/16/2018 1352   CREATININE 1.16 04/16/2018 1352   CALCIUM 9.9 04/16/2018 1352   PROT 7.4 04/16/2018 1352   ALBUMIN 4.8 04/16/2018 1352   AST 57 (H) 04/16/2018 1352   ALT 47 (H) 04/16/2018 1352   ALKPHOS 37 (L) 04/16/2018 1352   BILITOT 0.5 04/16/2018 1352   GFRNONAA 66 04/16/2018 1352  GFRAA 77 04/16/2018 1352   No results found for: "CHOL", "HDL", "LDLCALC", "LDLDIRECT", "TRIG", "CHOLHDL" Lab Results  Component Value Date   HGBA1C 5.4 01/17/2013   Lab Results  Component Value Date   VITAMINB12 302 07/14/2014   Lab Results  Component Value Date   TSH 2.320 01/17/2013   Vit D, 25-Hydroxy  Date Value Ref Range Status  01/04/2016 31.8 30.0 - 100.0 ng/mL Final    Comment:    Vitamin D deficiency has been defined by the Institute of Medicine and an Endocrine Society practice guideline as a level of serum 25-OH vitamin D less than 20 ng/mL (1,2). The Endocrine Society went on to further define vitamin D insufficiency as a level between 21 and 29 ng/mL (2). 1. IOM (Institute of Medicine). 2010. Dietary reference    intakes for calcium and D. [place]: The    [employer]. 2. Holick MF, Binkley Elrod, Bischoff-Ferrari HA, et al.    Evaluation, treatment, and prevention of vitamin D    deficiency: an Endocrine Society clinical practice    guideline. JCEM. 2011 Jul; 96(7):1911-30.    Lymphocytes Absolute  Date Value Ref Range Status  04/16/2018 0.9 0.7 - 3.1 x10E3/uL Final  01/04/2016 0.9 0.7 - 3.1 x10E3/uL Final  01/05/2015 0.8 0.7 - 3.1 x10E3/uL Final    02/06/13 MRI brain (with and without) demonstrating: 1. Multiple periventricular and subcortical chronic demyelinating plaques. Chronic plaque noted in the right medulla. 2. No acute plaques. 3. No change from MRI on 04/02/12.  02/06/13 MRI thoracic spine (with and without) demonstrating: 1. Multiple chronic demyelinating plaques at T5, T6, T7 and T9-10. 2. No acute plaques. 3. No change from MRI on 04/02/12.  10/09/16 MRI brain  1. Stable compared to 07/26/2015.  No explanation for new face pain. 2. Multiple sclerosis with stable plaque burden and no signs of active demyelination. There is a plaque near the left trigeminal root entry zone, but stable. 3. Normal brain volume.  01/06/15 JCV ab - 1.33 H positive    ASSESSMENT AND PLAN  72 y.o. year old male here with multiple sclerosis and prior TIA.  MS --> since 1989; on betaseron 1995-2000; now doing well on tecfidera since Dec 2013. He reports being steroid intolerant in past (hallucinations, stomach irritation, weight gain).   TIA --> due to hypertension, hypercholesterolemia. Had right brain TIA on 04/08/12, with right ICA stenosis, s/p right CEA 06/10/12.    Dx:  No diagnosis found.      PLAN:  MULTIPLE SCLEROSIS DISEASE MODIFYING THERAPY (stable) - continue vumerity for multiple sclerosis - annual CBC, CMP per PCP - consider MRI (will try to get locally where patient lives)  LEFT FACIAL PAIN / TRIGEMINAL NEURALGIA (stable) - continue CBZ 600mg  twice a day - continue gabapentin 600mg  twice a  day    STROKE PREVENTION - continue stroke prevention with aggrenox, statin, BP control per PCP  No orders of the defined types were placed in this encounter.  No follow-ups on file.       I spent *** minutes of face-to-face and non-face-to-face time with patient.  This included previsit chart review, lab review, study review, order entry, electronic health record documentation, patient education and discussion regarding above diagnoses and treatment plan and answered all other questions to patient's satisfaction  Ihor Austin, [facility]  [facility] [address] [place], [place] [address]  Phone [phone] Fax [phone] Note: This document was prepared with digital dictation and possible smart  Lobbyist. Any transcriptional errors that result from this process are unintentional.

## 2023-06-19 ENCOUNTER — Telehealth: Payer: Self-pay | Admitting: Adult Health

## 2023-06-27 ENCOUNTER — Other Ambulatory Visit: Payer: Self-pay | Admitting: Diagnostic Neuroimaging

## 2023-06-27 DIAGNOSIS — G35 Multiple sclerosis: Secondary | ICD-10-CM

## 2023-07-10 ENCOUNTER — Telehealth (INDEPENDENT_AMBULATORY_CARE_PROVIDER_SITE_OTHER): Payer: Medicare Other | Admitting: Adult Health

## 2023-07-10 ENCOUNTER — Encounter: Payer: Self-pay | Admitting: Adult Health

## 2023-07-10 DIAGNOSIS — G35 Multiple sclerosis: Secondary | ICD-10-CM

## 2023-07-10 DIAGNOSIS — G4719 Other hypersomnia: Secondary | ICD-10-CM

## 2023-07-10 DIAGNOSIS — Z8673 Personal history of transient ischemic attack (TIA), and cerebral infarction without residual deficits: Secondary | ICD-10-CM

## 2023-07-10 DIAGNOSIS — R519 Headache, unspecified: Secondary | ICD-10-CM

## 2023-07-10 DIAGNOSIS — R260 Ataxic gait: Secondary | ICD-10-CM | POA: Diagnosis not present

## 2023-07-10 MED ORDER — GABAPENTIN 300 MG PO CAPS: 900.0000 mg | ORAL_CAPSULE | Freq: Three times a day (TID) | ORAL | 4 refills | Status: DC

## 2023-07-10 MED ORDER — VUMERITY 231 MG PO CPDR: 2.0000 | DELAYED_RELEASE_CAPSULE | Freq: Two times a day (BID) | ORAL | 11 refills | Status: DC

## 2023-07-10 MED ORDER — CARBAMAZEPINE ER 200 MG PO TB12: 600.0000 mg | ORAL_TABLET | Freq: Two times a day (BID) | ORAL | 4 refills | Status: DC

## 2023-07-10 NOTE — Patient Instructions (Addendum)
Your Plan:  Continue Vumerity twice daily - refill provided  You will be called to schedule an MRI of your cervical and thoracic spine  Please let me know if you would like to participate in physical therapy  Please follow-up with your PCP to check lab work for reversible causes of fatigue as well as discuss pursuing sleep study to rule out possible underlying sleep apnea contributing  Continue carbamazepine and gabapentin for trigeminal neuralgia  Continue to follow with PCP and cardiology for stroke risk factor management, discuss Leqvio therapy for cholesterol management with your cardiologist     Follow-up in 1 year or call earlier if needed      Thank you for coming to see [place] at [facility]. I hope we have been able to provide you high quality care today.  You may receive a patient satisfaction survey over the next few weeks. We would appreciate your feedback and comments so that we may continue to improve ourselves and the health of our patients.

## 2023-07-10 NOTE — Progress Notes (Signed)
[facility]  PATIENT: Grant Valdez DOB: [birth year]  REFERRING CLINICIAN:  HISTORY FROM: patient  REASON FOR VISIT: follow up  Virtual Visit via Video Note  I connected with Grant Valdez on 07/10/23 at  2:15 PM EST by a video enabled telemedicine application and verified that I am speaking with the correct person using two identifiers.  Location: Patient: at home Provider: in office   I discussed the limitations of evaluation and management by telemedicine and the availability of in person appointments. The patient expressed understanding and agreed to proceed.    HISTORICAL   HISTORY OF PRESENT ILLNESS:    Update 06/14/2023 JM: Patient returns via MyChart video visit.  He does report gradual worsening of balance and unsteadiness over the past 2 to 3 months, reports more of an abrupt onset which has been relatively stable since that time. He feels like he walks like he is drunk but does not consume alcohol. He can frequently run into doorways or walls. Denies any vision changes or weakness. Does have some neck pain/stiffness, reports neck can "pop" when he looks towards the sides. Denies any radiculopathy symptoms.  Does have chronic LE neuropathy has been stable without worsening.  He also notes increased fatigue over the past couple of months. Wife does mention mild snoring, no witnessed apneas.  Nocturia 2x per night on average. Feels like he sleeps well at night but can easily take a nap during the day.  He continues to try to stay active working at the [place] where he resides.  He does not do any routine exercise but his job requires a lot of walking.  Remains on carbamazepine and gabapentin for trigeminal neuralgia which has been overall stable, currently having tip of nose pain only with touch otherwise no pain, has not had any recent severe persistent pain in at least 9 months.   He was seen in ED near his home in [place], [place] on 10/15 due to slurred  speech which lasted about 1.5 days, MR brain no intracranial abnormality, no new MS lesions identified.  CTA showed left ICA 35% stenosis.  LDL 141, A1c 5.7.  Blood pressure noted to be elevated, resumed home medications, recommended PCP follow-up.  Continued on aspirin, Plavix and Crestor 40 mg daily. Per ED note, suspected possibly due to MS. Denies any recurrence of symptoms. Cardiology previously prescribed Repatha in 05/2022 but patient declined as he was not interested in injections. He is due for a follow up visit to further discuss.      UPDATE (05/02/22, VRP): Since last visit, doing well. Symptoms are stable. No alleviating or aggravating factors. Tolerating meds. Doing well on vumerity.  UPDATE (05/17/21, VRP): Since last visit, doing well. Now living in [place], [place], near [place]. Tolerating vumerity. TN sxs are stable. Had labs via new PCP.   UPDATE (04/21/19, VRP): Since last visit, doing fair. TN sxs stable on CBZ 500mg  twice a day. Tecfidera stable. Symptoms are stable. No alleviating or aggravating factors.    UPDATE (07/17/17, VRP): Since last visit, doing well. Tolerating tecfidera. No alleviating or aggravating factors.   UPDATE 01/09/17: Since last visit, left facial pain improved. Tolerating CBZ. Now on CBZ 100mg  BID. No new MS symptoms.  UPDATE 10/04/16: Since last visit, was doing well until yesterday; now having new onset of left facial pain, intermittent, shooting electrical pain, from left lower jaw up to left eye. Triggered by brushing teeth, licking lip or touching. Multiple attacks per day. Tolerating  tecfidera.   UPDATE 01/04/16: Since last visit, doing about the same. No new sxs. Tolerating tecfidera.   UPDATE 07/07/15: No new issues. Tolerating meds. Overall doing well.  UPDATE 01/05/15: Since last visit, overall stable. Still notes some balance difficulty. He is trying to increase his walking back up to 2 miles per day. Tolerating meds. No new issues.  UPDATE  07/14/14 (LL): Patient failed to follow up for a year, was supposed to follow up in 3 months after last visit.  He did not want to do PT. He has been well on Tecfidera with only flushing side effect. He presents today to have labs and get refills for his medication. Has fallen 2-3 times in the last year, no injuries. Complaints of bilateral finger numbness and paresthesias, slower cognitive processing time, fatigue. MMSE 28/30, AFT 18.  UPDATE 06/17/13: Patient presents for followup. Since last visit he has fallen 4 times including breaking his left foot, almost 4 months ago. Otherwise patient is stable. He still has issues with fatigue depression anxiety.  UPDATE 01/17/13: patient returns for urgent followup. Over past 3 weeks, patient has had increasing dizziness, balance difficulty, urination control problems. He feels some additional numbness or weakness in his legs. No difficulty with his hands or arms. No vision changes, slurred speech, trouble talking. Wife notes some more mood swings, irritability, depression, anxiety, attention and focus problems. He also has had more difficulty with hearing.  UPDATE 09/30/12: Since last visit, doing well. Started tecfidera on Jul 21, 2012. Initially flushing and GI sxs, but have eased up. No new MS flares or sxs.  UPDATE 06/19/12: S/p right CEA on 06/10/12. No complications. No new stroke/TIA symptoms. Will proceed with MS treatment.   UPDATE 05/01/12: Since last visit, had MRI brain, c and t spine, confirming chronic MS. On 04/08/12, had sudden onset dizziness, seeing floaters, and left face arm leg numbness x 10 minutes. Didn't tell anyone for 4 days. Had full recovery. Today carotid u/s shows right ICA stenosis (50-69%). Also with fatigue, malaise. Thinking about disability.  PRIOR HPI: 72 year old right-handed male with history of hypertension, hypercholesteremia, coronary artery disease, depression, anxiety, multiple sclerosis, TIA, here for evaluation and  management of MS. In 1989, patient developed numbness spots in the back. He was evaluated by Dr. Noreene Filbert ([facility]), with MRI and lumbar puncture, and given a diagnosis of MS. We do not have those records to review today. 2 years later he transferred care to Dr. Stoney Bang [facility]), who treated him with prednisone. Over these years she developed some staggering, balance difficulty, vision problems. In 1995 he was started on Betaseron. Over the next 10-15 years he was on and off of Betaseron, do to fluctuation in liver function testing. Patient last saw Dr. Stoney Bang in 2011. He saw a different neurologist at [facility] in 2012. Since that time he has not seen a neurologist. His last MRI was around 2000 102,011, which is apparently stable. He weaned himself off of Betaseron in 2011 because he felt that his symptoms weren't fairly mild.  In 2013 he is started to develop increasing fatigue, balance difficulty, numbness in his hands and feet. Also having trouble with coordination.  REVIEW OF SYSTEMS: Full 14 system review of systems performed and negative except: as per HPI.    ALLERGIES: No Known Allergies  HOME MEDICATIONS: Outpatient Medications Prior to Visit  Medication Sig Dispense Refill   clopidogrel (PLAVIX) 75 MG tablet Take 75 mg by mouth daily.     baclofen (LIORESAL)  10 MG tablet Take 0.5-1 tablets (5-10 mg total) by mouth 2 (two) times daily. 30 each 6   buPROPion (WELLBUTRIN XL) 300 MG 24 hr tablet Take 200 mg by mouth daily.      Choline Fenofibrate (FENOFIBRIC ACID) 135 MG CPDR Take 1 capsule by mouth daily.   0   citalopram (CELEXA) 20 MG tablet Take 20 mg by mouth daily.  0   HYDROcodone-acetaminophen (NORCO/VICODIN) 5-325 MG tablet Take 1 tablet by mouth every 8 (eight) hours as needed for moderate pain. 30 tablet 0   LORazepam (ATIVAN) 0.5 MG tablet Take 0.5 mg by mouth daily. nerves     metoprolol (LOPRESSOR) 50 MG tablet Take 25 mg by mouth 2 (two) times daily.       Multiple Vitamin (MULTIVITAMIN WITH MINERALS) TABS tablet Take 1 tablet by mouth daily.     nitroGLYCERIN (NITROSTAT) 0.4 MG SL tablet Place 1 tablet (0.4 mg total) under the tongue every 5 (five) minutes as needed for chest pain. 25 tablet 3   Omega-3 Fatty Acids (FISH OIL) 1000 MG CAPS Take 1 capsule by mouth 2 (two) times daily.      omeprazole (PRILOSEC) 20 MG capsule Take 20 mg by mouth daily.       predniSONE (DELTASONE) 10 MG tablet Take 60mg  on day 1. Reduce by 10mg  each subsequent day. (60, 50, 40, 30, 20, 10, stop) 21 tablet 0   rosuvastatin (CRESTOR) 20 MG tablet Take 40 mg by mouth daily.     carbamazepine (TEGRETOL XR) 200 MG 12 hr tablet Take 3 tablets (600 mg total) by mouth 2 (two) times daily. 540 tablet 4   dipyridamole-aspirin (AGGRENOX) 200-25 MG 12hr capsule Take 1 capsule by mouth 2 (two) times daily.  0   Diroximel Fumarate (VUMERITY) 231 MG CPDR TAKE 2 CAPSULES (462 MG) BY MOUTH IN THE MORNING AND AT BEDTIME. PATIENT NEEDS AN APPOINTMENT FOR FUTURE REFILLS. 60 capsule 0   gabapentin (NEURONTIN) 300 MG capsule Take 3 capsules (900 mg total) by mouth 3 (three) times daily. 810 capsule 4   No facility-administered medications prior to visit.    PAST MEDICAL HISTORY: Past Medical History:  Diagnosis Date   Anxiety    CAD (coronary artery disease)    had 2 arteries tented four years ago sees Dr. Andee Lineman   Carotid artery occlusion    Depression    Diabetes mellitus without complication (HCC)    GERD (gastroesophageal reflux disease)    Headache(784.0)    migraines hx of   HLD (hyperlipidemia)    HTN (hypertension)    Sees Dr. Sherril Croon   MS (multiple sclerosis) (HCC)    diagnosed in 1989. He was treated with prednisone followed by betaseron which was gradually tapered because his LFTs went up and it resolved off therapy. Likely he is remained in remission for years   Neuromuscular disorder [facility])    has Multiple Sclerosis, sees Dr. Marjory Lies   S/P right inguinal  herniorrhaphy    Stroke [facility])    "patient states has had several TIA's, last one 05/27/12"   TIA (transient ischemic attack)    Trigeminal neuralgia     PAST SURGICAL HISTORY: Past Surgical History:  Procedure Laterality Date   CAROTID ENDARTERECTOMY     COLONOSCOPY  March 12, 2006   Had 2 polyps removed; one was an adenoma and the other one was hyperplastic   COLONOSCOPY N/A 10/09/2012   Procedure: COLONOSCOPY;  Surgeon: Malissa Hippo, MD;  Location[facility];  Service: Endoscopy;  Laterality: N/A;  1030-rescheduled to 12:00pm per Dewayne Hatch   ENDARTERECTOMY  06/10/2012   Procedure: ENDARTERECTOMY CAROTID;  Surgeon: Larina Earthly, MD;  Location: [facility] OR;  Service: Vascular;  Laterality: Right;  With Dacron Patch Angioplasty   EYE SURGERY Right 10/2015   cataract   HERNIA REPAIR  age 59   HYDROCELE EXCISION  2011   stents     varicose removal from scrotum  1982   VASECTOMY  1989    FAMILY HISTORY: Family History  Problem Relation Age of Onset   Diabetes Mother    Hypertension Mother    Heart disease Mother        Before age 57   Hyperlipidemia Mother    Heart attack Father    Lung cancer Father    Cancer Father        Lung   Heart disease Father        Before age 52   Hypertension Son     SOCIAL HISTORY:  Social History   Socioeconomic History   Marital status: Married    Spouse name: Arnesh Bandt   Number of children: 2   Years of education: 24yrs   Highest education level: Not on file  Occupational History    Employer: [employer]    Comment: [employer]  Tobacco Use   Smoking status: Former    Current packs/day: 0.00    Average packs/day: 2.0 packs/day for 25.2 years (50.5 ttl pk-yrs)    Types: Cigarettes    Start date: 05/23/1968    Quit date: 08/21/1993    Years since quitting: 29.9   Smokeless tobacco: Never  Substance and Sexual Activity   Alcohol use: No    Alcohol/week: 0.0 standard drinks of alcohol   Drug use: No   Sexual activity: Not  on file  Other Topics Concern   Not on file  Social History Narrative   Full time. Married. Works at [employer].    Pt lives at home with his spouse.   Caffeine Use: none   Social Determinants of Health   Financial Resource Strain: Low Risk  (06/12/2023)   Received from [facility]   Overall Financial Resource Strain (CARDIA)    Difficulty of Paying Living Expenses: Not hard at all  Food Insecurity: No Food Insecurity (06/12/2023)   Received from [facility]   Hunger Vital Sign    Worried About Running Out of Food in the Last Year: Never true    Ran Out of Food in the Last Year: Never true  Transportation Needs: No Transportation Needs (06/12/2023)   Received from [facility] - Transportation    Lack of Transportation (Medical): No    Lack of Transportation (Non-Medical): No  Physical Activity: Unknown (06/12/2023)   Received from [facility]   Exercise Vital Sign    Days of Exercise per Week: 0 days    Minutes of Exercise per Session: Not on file  Stress: No Stress Concern Present (06/12/2023)   Received from [facility] - Occupational Stress Questionnaire    Feeling of Stress : Not at all  Social Connections: Moderately Integrated (06/12/2023)   Received from [facility]   Social Network    How would you rate your social network (family, work, friends)?: Adequate participation with social networks  Intimate Partner Violence: Not At Risk (06/12/2023)   Received from [facility]   HITS  Over the last 12 months how often did your partner physically hurt you?: Never    Over the last 12 months how often did your partner insult you or talk down to you?: Never    Over the last 12 months how often did your partner threaten you with physical harm?: Never    Over the last 12 months how often did your partner scream or curse at you?: Never     PHYSICAL EXAM  GENERAL EXAM: Patient is in no distress;  well developed, nourished and groomed; neck is supple  VIDEO VISIT    DIAGNOSTIC DATA (LABS, IMAGING, TESTING) - I reviewed patient records, labs, notes, testing and imaging myself where available.  Lab Results  Component Value Date   WBC 4.9 04/16/2018   HGB 15.5 04/16/2018   HCT 44.2 04/16/2018   MCV 93 04/16/2018   PLT 165 04/16/2018      Component Value Date/Time   NA 137 04/16/2018 1352   K 4.6 04/16/2018 1352   CL 101 04/16/2018 1352   CO2 21 04/16/2018 1352   GLUCOSE 84 04/16/2018 1352   GLUCOSE 93 07/03/2015 1641   BUN 14 04/16/2018 1352   CREATININE 1.16 04/16/2018 1352   CALCIUM 9.9 04/16/2018 1352   PROT 7.4 04/16/2018 1352   ALBUMIN 4.8 04/16/2018 1352   AST 57 (H) 04/16/2018 1352   ALT 47 (H) 04/16/2018 1352   ALKPHOS 37 (L) 04/16/2018 1352   BILITOT 0.5 04/16/2018 1352   GFRNONAA 66 04/16/2018 1352   GFRAA 77 04/16/2018 1352   No results found for: "CHOL", "HDL", "LDLCALC", "LDLDIRECT", "TRIG", "CHOLHDL" Lab Results  Component Value Date   HGBA1C 5.4 01/17/2013   Lab Results  Component Value Date   VITAMINB12 302 07/14/2014   Lab Results  Component Value Date   TSH 2.320 01/17/2013   Vit D, 25-Hydroxy  Date Value Ref Range Status  01/04/2016 31.8 30.0 - 100.0 ng/mL Final    Comment:    Vitamin D deficiency has been defined by the Institute of Medicine and an Endocrine Society practice guideline as a level of serum 25-OH vitamin D less than 20 ng/mL (1,2). The Endocrine Society went on to further define vitamin D insufficiency as a level between 21 and 29 ng/mL (2). 1. IOM (Institute of Medicine). 2010. Dietary reference    intakes for calcium and D. [place]: The    [employer]. 2. Holick MF, Binkley [place], Bischoff-Ferrari HA, et al.    Evaluation, treatment, and prevention of vitamin D    deficiency: an Endocrine Society clinical practice    guideline. JCEM. 2011 Jul; 96(7):1911-30.    Lymphocytes Absolute  Date Value  Ref Range Status  04/16/2018 0.9 0.7 - 3.1 x10E3/uL Final  01/04/2016 0.9 0.7 - 3.1 x10E3/uL Final  01/05/2015 0.8 0.7 - 3.1 x10E3/uL Final   04/02/2012 MRI brain IMPRESSION:  No acute infarct.   Moderate white matter type changes consistent with the patient's  history of multiple sclerosis have progressed slightly since the  2005 exam.  White matter type changes/MS plaques involve the  supratentorial region (most notable periventricular location) but  also seen within portions of the cerebellum and at the cervical  medullary junction (best seen on cervical spine MR).  None of these  areas demonstrate restricted motion or enhancement as can be seen  with areas of active demyelination.   There is also an area of encephalomalacia right parietal -  occipital junction which was noted previously and has  an appearance  more suggestive of remote infarct.   Global atrophy without hydrocephalus.   Diminutive size right vertebral artery may be congenitally small  possibly with superimposed atherosclerotic type changes and stable  in appearance.   04/02/2012 MRI thoracic spine IMPRESSION:   Regions of altered signal intensity within the thoracic cord  including; dorsal central T5 level, dorsal of T6 level, majority of  the cord at the T7 level and T9-10 level.  Small areas of  involvement of the ventral aspect of the cord at the T10 and T11  level not excluded.   None of these areas demonstrate enhancement to suggest active  demyelination.    04/02/2012 MRI C-spine IMPRESSION:   Areas of altered signal intensity consistent with MS plaques  involving portions of the cervical cord including; C2-3 level  dorsal central aspect, C3 level on the right, centrally at the C4  level, on the left at the C4-5 level, centrally and on the left at  the upper C5 level, on the left at the C5-6 level and on the left  at the C6 level.  None of these areas demonstrate enhancement as  may be seen with  areas of active demyelination.   Cervical spondylotic changes most prominent C5-6 level as detailed  above.    02/06/13 MRI brain (with and without) demonstrating: 1. Multiple periventricular and subcortical chronic demyelinating plaques. Chronic plaque noted in the right medulla. 2. No acute plaques. 3. No change from MRI on 04/02/12.  02/06/13 MRI thoracic spine (with and without) demonstrating: 1. Multiple chronic demyelinating plaques at T5, T6, T7 and T9-10. 2. No acute plaques. 3. No change from MRI on 04/02/12.  10/09/16 MRI brain  1. Stable compared to 07/26/2015.  No explanation for new face pain. 2. Multiple sclerosis with stable plaque burden and no signs of active demyelination. There is a plaque near the left trigeminal root entry zone, but stable. 3. Normal brain volume.  06/16/2022 MRI brain 1. Mild volume loss.  2. Stable moderately extensive bihemispheric white matter T2/FLAIR hyperintensities. These are nonspecific but compatible with previously noted stated clinical history of multiple sclerosis.  3. Stable small right parietal infarct.  4. No acute intracranial abnormality detected. No new or enhancing brain lesion. No new brain parenchymal signal abnormality, enhancement abnormality, or evidence of acute infarction.   06/05/2023 MRI brain NO ACUTE INFARCTION.  SIMILAR SCATTERED BIHEMISPHERIC T2/FLAIR HYPERINTENSITIES WHICH ARE NONSPECIFIC BUT ABNORMAL WITH HISTORY OF MULTIPLE SCLEROSIS. NO EVIDENCE OF ENHANCING LESION TO SUGGEST ACTIVE DEMYELINATION.   01/06/15 JCV ab - 1.33 H positive           ASSESSMENT AND PLAN  72 y.o. year old male here with multiple sclerosis and prior TIA.  MS --> since 1989; on betaseron 1995-2000; on tecfidera from 2013-2021, started on Vumerity in 03/2020. He reports being steroid intolerant in past (hallucinations, stomach irritation, weight gain).   TIA --> strokelike episode 05/2023 with slurred speech, work up negative,  Hx of R brain TIA on 04/08/12, with right ICA stenosis, s/p right CEA 06/10/12.    Dx:  Left facial pain - Plan: carbamazepine (TEGRETOL XR) 200 MG 12 hr tablet, gabapentin (NEURONTIN) 300 MG capsule  MULTIPLE SCLEROSIS, RELAPSING/REMITTING - Plan: Diroximel Fumarate (VUMERITY) 231 MG CPDR, gabapentin (NEURONTIN) 300 MG capsule, MR CERVICAL SPINE W WO CONTRAST, MR THORACIC SPINE W WO CONTRAST  Staggering gait - Plan: MR CERVICAL SPINE W WO CONTRAST, MR THORACIC SPINE W WO CONTRAST      PLAN:  MULTIPLE SCLEROSIS DISEASE MODIFYING THERAPY (stable) - continue vumerity for multiple sclerosis - annual CBC, CMP per PCP -MRI brain 05/2023 stable compared to 2018 imaging, no active lesions -complains of staggering gait and unsteadiness over the past 2 to 3 months, recent MRI brain no new lesions, recommend completing MR c-spine for further evaluation, will also complete MR t-spine to complete MS monitoring (to be completed through [facility] in [place], [place]) -Declines interest in PT, advised to call if interested in pursuing  LEFT FACIAL PAIN / TRIGEMINAL NEURALGIA (stable) - continue CBZ 600mg  twice a day - continue gabapentin 600mg  twice a day   -Refills provided  STROKE PREVENTION - continue stroke prevention with clopidogrel and statin -HLD uncontrolled on Crestor 40 mg daily with LDL goal<70, recent LDL 141 -advised follow-up with PCP/cardiology to discuss further treatment options, declines interest in Repatha due to frequency of injection, advised to discuss Leqvio with cardiology at follow up visit -Continue to follow with PCP for aggressive stroke risk factor management  DAYTIME FATIGUE -Advised to follow-up with PCP to check lab work such as B12, TSH and vitamin D -discussed pursuing sleep study to evaluate for sleep apnea with also reported snoring and nocturia, he will follow up with PCP to have this completed near him   Meds ordered this encounter  Medications    Diroximel Fumarate (VUMERITY) 231 MG CPDR    Sig: Take 2 capsules by mouth 2 (two) times daily.    Dispense:  60 capsule    Refill:  11    **Patient needs a Office visit for future Rx refills.**   carbamazepine (TEGRETOL XR) 200 MG 12 hr tablet    Sig: Take 3 tablets (600 mg total) by mouth 2 (two) times daily.    Dispense:  540 tablet    Refill:  4   gabapentin (NEURONTIN) 300 MG capsule    Sig: Take 3 capsules (900 mg total) by mouth 3 (three) times daily.    Dispense:  810 capsule    Refill:  4   Return in about 1 year (around 07/09/2024). Via MyChart 30 min VV       I spent 40 minutes of face-to-face and non-face-to-face time with patient via MyChart video visit.  This included previsit chart review, lab review, study review, order entry, electronic health record documentation, patient education and discussion regarding above diagnoses and treatment plan and answered all other questions to patient's satisfaction  Ihor Austin, [facility]  [facility] [address] [place], [place] [address]  Phone [phone] Fax [phone] Note: This document was prepared with digital dictation and possible smart phrase technology. Any transcriptional errors that result from this process are unintentional.

## 2023-07-11 ENCOUNTER — Other Ambulatory Visit: Payer: Self-pay | Admitting: Neurology

## 2023-07-11 ENCOUNTER — Telehealth: Payer: Self-pay | Admitting: Adult Health

## 2023-07-11 DIAGNOSIS — G35 Multiple sclerosis: Secondary | ICD-10-CM

## 2023-07-11 MED ORDER — VUMERITY 231 MG PO CPDR: 2.0000 | DELAYED_RELEASE_CAPSULE | Freq: Two times a day (BID) | ORAL | 1 refills | Status: DC

## 2023-07-11 NOTE — Telephone Encounter (Signed)
[facility] Joni Reining) clarification for Diroximel Fumarate (VUMERITY) 231 MG CPDR.Pt needs a 90-day supply instead of 15 day we received.

## 2023-07-11 NOTE — Telephone Encounter (Signed)
Refill sent for the patient.  

## 2023-07-12 ENCOUNTER — Telehealth: Payer: Self-pay | Admitting: Adult Health

## 2023-07-12 NOTE — Telephone Encounter (Signed)
self-pay sent to [facility] in [place] [phone]

## 2023-07-16 ENCOUNTER — Other Ambulatory Visit: Payer: Self-pay | Admitting: Neurology

## 2023-07-16 DIAGNOSIS — G35 Multiple sclerosis: Secondary | ICD-10-CM

## 2023-07-16 DIAGNOSIS — R519 Headache, unspecified: Secondary | ICD-10-CM

## 2023-07-16 MED ORDER — GABAPENTIN 300 MG PO CAPS: 900.0000 mg | ORAL_CAPSULE | Freq: Three times a day (TID) | ORAL | 4 refills | Status: DC

## 2023-08-09 NOTE — Telephone Encounter (Signed)
[facility] called and gave me his insurance information and I added it to his chart and got the PA. [employer] medicare Berkley Harvey: [id number] exp. 08/09/23-09/07/23  Sent fax to [phone]

## 2023-09-05 ENCOUNTER — Telehealth: Payer: Self-pay | Admitting: Adult Health

## 2023-09-05 NOTE — Telephone Encounter (Signed)
 Pt's wife called to discuss details of pt's needed MRI

## 2023-09-05 NOTE — Telephone Encounter (Signed)
 I spoke to the patient's wife and she said that he was scheduled in December but he was sick so they rescheduled to 2/8 at 1:45pm. The current authorization expires on 1/17. I let her know that I can get new authorizations after the current ones expire and I will fax them over.

## 2023-09-11 NOTE — Telephone Encounter (Signed)
New authorization: [employer] medicare: [id number] exp. 09/11/23-10/10/23  Sent to [place] in [place].

## 2023-10-01 ENCOUNTER — Telehealth: Payer: Self-pay | Admitting: Neurology

## 2023-10-01 NOTE — Telephone Encounter (Signed)
 Recent MRI C-spine showed multiple intermedullary cervical cord signal abnormalities noted at C2-3, C4, C5, C5-6, C7 and C7-T1, no enhancing lesions detected.. Last MRI cervical imaging that I can see was completed in 2013 which noted C7-T1 negative.  MRI T-spine showed intramedullary cord signal abnormality at T5 and T7 without enhancing cord lesions.  Prior MRI T-spine in 2014 showed demyelinating plaques at T5, T6, T7 and T9-10.  MRI brain previously completed in 05/2023 after episode of slurred speech without acute intercranial abnormality or evidence of new MS lesions.  He complained of gradually worsening balance and unsteadiness over the past 2 to 3 months during virtual visit in 06/2023 as well as neck pain/stiffness.  MRI C-spine did not show any evidence of central canal narrowing, did show multilevel foraminal stenosis at C3-4, C4-5 and C5-6. At this point, would recommend he continue on Vumerity , he declined interest in PT but this can be considered if neck pain or gait worsens. Do you have any further recommendations?

## 2023-10-01 NOTE — Telephone Encounter (Signed)
 Received the reports from where the pt completed the MRI cervical spine and thoracic spine.   MRI cervical

## 2023-10-09 ENCOUNTER — Telehealth: Payer: Self-pay | Admitting: Adult Health

## 2023-10-09 NOTE — Telephone Encounter (Signed)
 Joni Reining @ [employer] has called to report that a PA is needed for Diroximel Fumarate (VUMERITY) 231 MG CPDR

## 2023-10-11 ENCOUNTER — Encounter: Payer: Self-pay | Admitting: Adult Health

## 2023-10-11 ENCOUNTER — Other Ambulatory Visit (HOSPITAL_COMMUNITY): Payer: Self-pay

## 2023-10-11 ENCOUNTER — Telehealth: Payer: Self-pay

## 2023-10-11 NOTE — Telephone Encounter (Signed)
 Please have PT upload his recent new insurance card-insurance card in chart has not been updated since 02/2020 and will need info to submit PA. Thanks!

## 2023-10-11 NOTE — Telephone Encounter (Signed)
Pa needed

## 2023-10-11 NOTE — Telephone Encounter (Signed)
 PA request has been Started. New Encounter created for follow up. For additional info see Pharmacy Prior Auth telephone encounter from 10/11/2023.

## 2023-10-11 NOTE — Telephone Encounter (Signed)
 Left detailed message on patient voicemail to upload insurance card front and back to my chart due to PA needed for Vumerity.

## 2023-10-12 ENCOUNTER — Other Ambulatory Visit (HOSPITAL_COMMUNITY): Payer: Self-pay

## 2023-10-12 NOTE — Telephone Encounter (Signed)
 Pharmacy Patient Advocate Encounter   Received notification from Patient Pharmacy that prior authorization for VUMERITY 231MG  delayed-release capsules is required/requested.   Insurance verification completed.   The patient is insured through [facility] .   Per test claim: PA required; PA submitted to above mentioned insurance via CoverMyMeds Key/confirmation #/EOC [id number] Status is pending

## 2023-10-15 NOTE — Telephone Encounter (Signed)
 Pharmacy Patient Advocate Encounter  Received notification from [facility] that Prior Authorization for VUMERITY 231MG  delayed-release capsules has been APPROVED from 10-11-2022 to 10-11-2024   PA #/Case ID/Reference #: [id number]

## 2023-11-06 ENCOUNTER — Other Ambulatory Visit: Payer: Self-pay | Admitting: *Deleted

## 2023-11-06 MED ORDER — BACLOFEN 10 MG PO TABS: 5.0000 mg | ORAL_TABLET | Freq: Two times a day (BID) | ORAL | 3 refills | Status: AC

## 2023-11-06 NOTE — Telephone Encounter (Signed)
 Last seen on 07/10/23 per note " Return in about 1 year (around 07/09/2024). Via MyChart 30 min VV " No follow up scheduled

## 2024-01-10 ENCOUNTER — Other Ambulatory Visit: Payer: Self-pay | Admitting: Adult Health

## 2024-01-10 DIAGNOSIS — G35D Multiple sclerosis, unspecified: Secondary | ICD-10-CM

## 2024-01-10 DIAGNOSIS — G35 Multiple sclerosis: Secondary | ICD-10-CM

## 2024-04-29 NOTE — Progress Notes (Signed)
 Assessment and Plan  1. Medicare annual wellness visit, subsequent (Primary) -     Comprehensive Metabolic Panel; Future -     CBC And Differential; Future 2. Need for influenza vaccination -     Fluad Trivalent Adjuvanted -     Comprehensive Metabolic Panel; Future -     CBC And Differential; Future 3. Coronary artery disease of native artery of native heart with stable angina pectoris -     Comprehensive Metabolic Panel; Future -     CBC And Differential; Future -     rosuvastatin calcium (CRESTOR) 40 mg tablet; Take one tablet (40 mg dose) by mouth daily., Starting Tue 04/29/2024, Normal 4. Multiple sclerosis (*) -     Ambulatory referral to Neurology -     Comprehensive Metabolic Panel; Future -     CBC And Differential; Future 5. Hypothyroidism, unspecified type -     TSH+Free T4; Future -     Comprehensive Metabolic Panel; Future -     CBC And Differential; Future 6. Hyperlipidemia, unspecified hyperlipidemia type -     Comprehensive Metabolic Panel; Future -     CBC And Differential; Future 7. Trigeminal neuralgia -     Ambulatory referral to Neurology -     Comprehensive Metabolic Panel; Future -     CBC And Differential; Future -     carBAMazepine  (TEGRETOL  XR) 200 mg 12 hr tablet; Take three tablets (600 mg dose) by mouth 2 (two) times daily. Take 3 tablets (600mg ) daily, Starting Tue 04/29/2024, Until Sun 10/26/2024, Normal 8. Impaired fasting glucose -     Microalbumin/Creatinine Ratio ([facility]); Future -     Hemoglobin A1C; Future -     Comprehensive Metabolic Panel; Future -     CBC And Differential; Future 9. Vitamin D  deficiency -     Vitamin D  25 Hydroxy; Future 10. Nocturia -     PSA; Future 11. Recurrent major depressive disorder, remission status unspecified -     buPROPion hcl (WELLBUTRIN XL) 300 mg 24 hr tablet; Take one tablet (300 mg dose) by mouth every morning., Starting Tue 04/29/2024, Normal -     citalopram  hydrobromide (CELEXA ) 20 mg  tablet; Take one tablet (20 mg dose) by mouth daily., Starting Tue 04/29/2024, Normal 12. Well adult exam   AWV completed. See additional note. Update labs. Will update neurology referral for local neurologist.  He sees cardiology regularly. No new symptoms. Exercise tolerance is stable.  Of note, patient is not diabetic. Reviewed chart carefully. Patient denies known history of DM. He does have pre-DM.   Reviewed neuro notes. No acute changes but does admit to fatigue. Assess labs today. Reviewed s/sx of OSA. He denies snoring. Declines sleep study today. Will reassess at future visits.  Other issues above are chronic conditions currently stable on treatment plan. Reassess maintenance labs and refill medications as needed.  All questions were answered to the best of my ability and to the patient's satisfaction today. Routine lab work and health maintenance was reviewed and updated as indicated.  Follow up in about 6 months (around 10/27/2024).      Patient's Medications  New Prescriptions   No medications on file  Previous Medications   ACETAMINOPHEN -CODEINE (TYLENOL  #3) 300-30 MG PER TABLET    Take one tablet by mouth every 6 (six) hours as needed.   ALBUTEROL SULFATE HFA (PROVENTIL,VENTOLIN,PROAIR) 108 (90 BASE) MCG/ACT INHALER    Inhale two puffs into the lungs every 6 (six) hours as needed  for Wheezing.   AMOXICILLIN-CLAVULANATE (AUGMENTIN) 500-125 MG PER TABLET    Take one tablet by mouth 3 (three) times a day.   BACLOFEN  (LIORESAL ) 10 MG TABLET    SMARTSIG:0.5-1 Tablet(s) By Mouth Twice Daily   CHOLINE FENOFIBRATE (FENOFIBRIC ACID) 135 MG CPDR    TAKE 1 CAPSULE(135 MG) BY MOUTH DAILY   CLOPIDOGREL BISULFATE (PLAVIX) 75 MG TABLET    TAKE 1 TABLET(75 MG) BY MOUTH DAILY   DIROXIMEL FUMARATE , STARTER, (VUMERITY , STARTER,) 231 MG CPDR    Take 2 tablets by mouth 2 (two) times daily.   ERGOCALCIFEROL  (ERGOCALCIFEROL ) 1.25 MG (50000 UT) CAPSULE    Take one capsule (50,000 Units dose) by  mouth once a week.   EVOLOCUMAB (REPATHA SURECLICK) 140 MG/ML SOAJ PEN INJECTION    Inject 1 mL (140 mg dose) into the skin every 14 (fourteen) days.   EZETIMIBE  (ZETIA ) 10 MG TABLET    Take one tablet (10 mg dose) by mouth daily.   GABAPENTIN  (NEURONTIN ) 300 MG CAPSULE    Take three capsules (900 mg dose) by mouth 2 (two) times daily.   IBUPROFEN (ADVIL,MOTRIN) 600 MG TABLET    Take one tablet (600 mg dose) by mouth every 6 (six) hours as needed.   LEVOTHYROXINE SODIUM (SYNTHROID,LEVOTHROID,LOVOXYL) 50 MCG TABLET    TAKE 1 TABLET(50 MCG) BY MOUTH DAILY   METOPROLOL  TARTRATE (LOPRESSOR ) 50 MG TABLET    TAKE 1/2 TABLET(25 MG) BY MOUTH TWICE DAILY   MULTIPLE VITAMINS-MINERALS (MULTI VITAMIN/MINERALS) TABS    Take 1 tablet by mouth daily.   NITROGLYCERIN  (NITROSTAT ) 0.4 MG SL TABLET    PLACE 1 TABLET BY MOUTH UNDER THE TONGUE EVERY 5 MINUTES AS NEEDED FOR CHEST PAIN. CALL 911 IF NO RELIEF AFTER 3 CONSECUTIVE DOSES.   OMEGA-3 FATTY ACIDS (FISH OIL) 1200 MG CAPSULE    Take two capsules (2,400 mg dose) by mouth 2 (two) times daily.   OMEPRAZOLE (PRILOSEC) 20 MG CAPSULE    TAKE 1 CAPSULE(20 MG) BY MOUTH DAILY  Modified Medications   Modified Medication Previous Medication   BUPROPION HCL (WELLBUTRIN XL) 300 MG 24 HR TABLET buPROPion hcl (WELLBUTRIN XL) 300 mg 24 hr tablet      Take one tablet (300 mg dose) by mouth every morning.    Take one tablet (300 mg dose) by mouth every morning.   CARBAMAZEPINE  (TEGRETOL  XR) 200 MG 12 HR TABLET carBAMazepine  (TEGRETOL  XR) 200 mg 12 hr tablet      Take three tablets (600 mg dose) by mouth 2 (two) times daily. Take 3 tablets (600mg ) daily    Take three tablets (600 mg dose) by mouth 2 (two) times daily. Take 3 tablets (600mg ) daily   CITALOPRAM  HYDROBROMIDE (CELEXA ) 20 MG TABLET citalopram  hydrobromide (CELEXA ) 20 mg tablet      Take one tablet (20 mg dose) by mouth daily.    Take one tablet (20 mg dose) by mouth daily.   ROSUVASTATIN CALCIUM (CRESTOR) 40 MG TABLET  rosuvastatin calcium (CRESTOR) 40 mg tablet      Take one tablet (40 mg dose) by mouth daily.    Take one tablet (40 mg dose) by mouth daily.  Discontinued Medications   No medications on file      Subjective      Patient presents with  . Medicare Wellness Visit    Patient ID:  Grant Valdez. is a 73 y.o. (DOB 1951/06/11) male here for follow up. He has a history of MS, previous stroke, pre-DM,  CAD, HTN, HLD, anxiety/depression, GERD. HPI Patient has a history of trigeminal neuralgia and MS. He was continues seeing neuro in [place], [place]. We have been trying to get him established with a local neurologist, but no one locally is specialized in MS or agrees to see him.  No acute changes in symptoms.  We reviewed this again today.  There have been some new hires and local neurology, but he prefers to stay with his current provider in [place]. Will try for a new referral. He has lumps under his skin from previous injectable meds for MS.    He has a history of CAD, previous stroke, and carotid endarterectomy. Sees local cardiology. No CP. No stroke like symptoms. No other changes in his health. Taking clopidogrel per cards/neuro. No bleeding concerns. Continues on a statin. Cardiology following. Hypertension The patient has a chronic history of hypertension, lasting over one year.  Blood pressures have been stable and the patient tolerates the medications without significant side effects.  The patient denies chest pain, palpitations, cough, shortness of breath, edema, orthopnea, or headaches. There are no associated agents contributing to elevated blood pressures.  The patient has been compliant, and improvement on medications has been significant. Depression This is a chronic problem, lasting over one year.  Currently, the patient's symptoms are well controlled on medications.  The patient notices improvement in mood, energy, sleep, appetite, and concentration on medications.  No suicidal  ideation.  There are no medication side effects; including no chest pain, palpitations, somnolence, weight change, abdominal pain, or headache. He previously took chronic BZD. Not taking currently.  He admits to nocturia 1-3 times each night.  No dribbling or trouble with his stream.  No hematuria.  Reviewed and updated this visit by provider: Tobacco  Allergies  Meds  Problems  Med Hx  Surg Hx  Fam Hx        Review of Systems  As described above. Otherwise all other organ systems reviewed were negative. Objective  BP 136/74   Pulse 55   Temp 97.8 F (36.6 C) (Temporal)   Ht 6' (1.829 m)   Wt 243 lb (110.2 kg)   SpO2 97%   BMI 32.96 kg/m    Physical Exam   Gen: Alert and oriented. No acute distress. Appears stated age. Caucasian male HEENT: Normocephalic, atraumatic. Neck: No JVD. Trachea midline. Cardiovascular: Regular rate and rhythm. No murmur appreciated. Lungs: Clear to auscultation bilaterally. Abd: No distension. Multiple subcutaneous lumps noted. Normal bowel sounds. No tenderness. Ext: No edema, clubbing or cyanosis. Msk: Normal range of motion. Gait stable. Psyc: Appropriate and cooperative. Neuro: Moves all 4 extremities. Grossly nonfocal.   Risks, benefits, and alternatives of the medications and treatment plan prescribed today were discussed, and patient expressed understanding. Plan follow-up as discussed or as needed if any worsening symptoms or change in condition.   A yearly preventative health exam was recommended and current age based recommendations were discussed.

## 2024-04-30 ENCOUNTER — Other Ambulatory Visit: Payer: Self-pay | Admitting: Adult Health

## 2024-04-30 DIAGNOSIS — G35 Multiple sclerosis: Secondary | ICD-10-CM

## 2024-05-06 ENCOUNTER — Telehealth: Payer: Self-pay | Admitting: Adult Health

## 2024-05-06 MED ORDER — HYDROCODONE-ACETAMINOPHEN 5-325 MG PO TABS: 1.0000 | ORAL_TABLET | Freq: Three times a day (TID) | ORAL | 0 refills | Status: AC | PRN

## 2024-05-06 MED ORDER — PREDNISONE 10 MG PO TABS: ORAL_TABLET | ORAL | 0 refills | Status: AC

## 2024-05-06 NOTE — Telephone Encounter (Signed)
 MD attempted to contact pt, no answer. He will reach out at a later time.

## 2024-05-06 NOTE — Telephone Encounter (Addendum)
 Patient's wife returned phone call, transferred call to [facility]

## 2024-05-06 NOTE — Telephone Encounter (Addendum)
 Spouse returned call, I discussed MD recommendations with her and pt. They were agreeable to MRI. He is wiling to try dose pack & Hydro. He did mention being on baclofen  in the past, questioned if that would be a better option. If not, he was ok with dose pack and/or hydro.   MRI to be completed at [id number] - [place], [place] - [address] AT [facility] OF [address]

## 2024-05-06 NOTE — Telephone Encounter (Addendum)
 Patient's wife, Grant Valdez said he's having a trigeminal neuralgia flare up. He's having pain in left eye; burning sensation like a ice pic sticking in his eye. Can not touch his nose or that side of his face. Would like a call back to discuss if medication needs to be increased his carbamazepine  (TEGRETOL  XR) 200 MG 12 hr tablet.  His wife upset to see him in so pain, she is so worried about him

## 2024-05-06 NOTE — Telephone Encounter (Signed)
  Sending in meds for acute trigeminal neuralgia pain.  Meds ordered this encounter  Medications   predniSONE  (DELTASONE ) 10 MG tablet    Sig: Take 60mg  on day 1. Reduce by 10mg  each subsequent day. (60, 50, 40, 30, 20, 10, stop)    Dispense:  21 tablet    Refill:  0   HYDROcodone -acetaminophen  (NORCO/VICODIN) 5-325 MG tablet    Sig: Take 1 tablet by mouth every 8 (eight) hours as needed for severe pain (pain score 7-10).    Dispense:  15 tablet    Refill:  0   EDUARD FABIENE HANLON, MD 05/06/2024, 7:03 PM Certified in Neurology, Neurophysiology and Neuroimaging  [facility] [address] [place], [place] [address] [phone]

## 2024-05-06 NOTE — Telephone Encounter (Addendum)
 Patient's wife called, would like a call back to discuss what can be done until his appointment on 05/13/24 for pain.

## 2024-05-06 NOTE — Addendum Note (Signed)
 Addended by: MARGARET CARNE R on: 05/06/2024 07:04 PM   Modules accepted: Orders

## 2024-05-06 NOTE — Telephone Encounter (Signed)
-   check MRI brain w/wo to rule out MS flare up - prednisone  dose pack - consider IV phenytoin infusion - consider short term hydrocodone   EDUARD FABIENE HANLON, MD 05/06/2024, 9:47 AM Certified in Neurology, Neurophysiology and Neuroimaging  [facility] [address] [place], [place] [address] [phone]

## 2024-05-06 NOTE — Telephone Encounter (Signed)
 I attempted to contact pt wife, line proceeded to drop with no answer. Will send [facility]: Lambert Rhonda W msg.

## 2024-05-13 ENCOUNTER — Ambulatory Visit: Admitting: Adult Health

## 2024-05-15 ENCOUNTER — Other Ambulatory Visit: Payer: Self-pay | Admitting: *Deleted

## 2024-05-15 DIAGNOSIS — G35 Multiple sclerosis: Secondary | ICD-10-CM

## 2024-05-15 MED ORDER — VUMERITY 231 MG PO CPDR: 2.0000 | DELAYED_RELEASE_CAPSULE | Freq: Two times a day (BID) | ORAL | 0 refills | Status: DC

## 2024-07-02 ENCOUNTER — Other Ambulatory Visit: Payer: Self-pay | Admitting: Adult Health

## 2024-07-02 DIAGNOSIS — G35D Multiple sclerosis, unspecified: Secondary | ICD-10-CM

## 2024-07-04 NOTE — Telephone Encounter (Signed)
 I spoke with the patient.  He states he is not out of his Vumerity  yet.  He mentioned that he has seen [facility] neurology but this is specifically for his trigeminal neuralgia because it got so severe.  His neurologist there is able to be seen with a surgeon.  They are doing more scans to see if there are plaques on his trigeminal nerve first.  Patient said they do not treat him and at this time he plans to continue seeing us  for his MS.  He is aware he is due for yearly follow-up.  [place] [facility] he was scheduled for a video visit (approved by JM NP) for next Monday 07/07/24 at 10:15 AM.  I went ahead and sent in 1 refill of the Vumerity  231 mg (taking 2 caps BID). He thanked me for the first call.   Of note, patient mentioned that [facility] neurology brought up that he may want to discuss the need for continued medication for MS given his age.

## 2024-07-04 NOTE — Progress Notes (Signed)
 [facility]  PATIENT: Grant Valdez DOB: August 25, 1950  REFERRING CLINICIAN:  HISTORY FROM: patient  REASON FOR VISIT: follow up  Virtual Visit via Video Note  I connected with Grant Valdez on 07/07/24 at 10:15 AM EST by a video enabled telemedicine application and verified that I am speaking with the correct person using two identifiers.  Location: Patient: at home Provider: in office   I discussed the limitations of evaluation and management by telemedicine and the availability of in person appointments. The patient expressed understanding and agreed to proceed.    HISTORICAL   HISTORY OF PRESENT ILLNESS:   Update 07/07/2024 JM: Patient returns for yearly follow-up visit via MyChart video visit.  Overall has been stable from an MS standpoint.  He continues on Vumerity  and tolerates well. Still some difficulty with his balance but this is unchanged. Chronic LE neuropathy unchanged. No new or worsening MS symptoms. Reports routine labs with PCP which has been satisfactory.  He did have flareup of trigeminal neuralgia in September and was seen in ED in [place] and now is being followed by [facility] neurology.  He was seen by neurosurgery for surgical options, completed repeat MRI which did not show obvious plaques from MS contributing to facial pain, recommended proceeding with MRI brain with FIESTA, currently waiting to be scheduled. Reports significant pain for 2-week duration, gradually improving, remains on gabapentin  and carbamazepine  without side effects.      Update 06/14/2023 JM: Patient returns via MyChart video visit.  He does report gradual worsening of balance and unsteadiness over the past 2 to 3 months, reports more of an abrupt onset which has been relatively stable since that time. He feels like he walks like he is drunk but does not consume alcohol. He can frequently run into doorways or walls. Denies any vision changes or weakness. Does have some  neck pain/stiffness, reports neck can pop when he looks towards the sides. Denies any radiculopathy symptoms.  Does have chronic LE neuropathy has been stable without worsening.  He also notes increased fatigue over the past couple of months. Wife does mention mild snoring, no witnessed apneas.  Nocturia 2x per night on average. Feels like he sleeps well at night but can easily take a nap during the day.  He continues to try to stay active working at the [employer] where he resides.  He does not do any routine exercise but his job requires a lot of walking.  Remains on carbamazepine  and gabapentin  for trigeminal neuralgia which has been overall stable, currently having tip of nose pain only with touch otherwise no pain, has not had any recent severe persistent pain in at least 9 months.   He was seen in ED near his home in [place], [place] on 10/15 due to slurred speech which lasted about 1.5 days, MR brain no intracranial abnormality, no new MS lesions identified.  CTA showed left ICA 35% stenosis.  LDL 141, A1c 5.7.  Blood pressure noted to be elevated, resumed home medications, recommended PCP follow-up.  Continued on aspirin , Plavix and Crestor 40 mg daily. Per ED note, suspected possibly due to MS. Denies any recurrence of symptoms. Cardiology previously prescribed Repatha in 05/2022 but patient declined as he was not interested in injections. He is due for a follow up visit to further discuss.   UPDATE (05/02/22, VRP): Since last visit, doing well. Symptoms are stable. No alleviating or aggravating factors. Tolerating meds. Doing well on vumerity .  UPDATE (05/17/21, VRP): Since  last visit, doing well. Now living in [place], [place], near [place]. Tolerating vumerity . TN sxs are stable. Had labs via new PCP.   UPDATE (04/21/19, VRP): Since last visit, doing fair. TN sxs stable on CBZ 500mg  twice a day. Tecfidera  stable. Symptoms are stable. No alleviating or aggravating factors.    UPDATE (07/17/17,  VRP): Since last visit, doing well. Tolerating tecfidera . No alleviating or aggravating factors.   UPDATE 01/09/17: Since last visit, left facial pain improved. Tolerating CBZ. Now on CBZ 100mg  BID. No new MS symptoms.  UPDATE 10/04/16: Since last visit, was doing well until yesterday; now having new onset of left facial pain, intermittent, shooting electrical pain, from left lower jaw up to left eye. Triggered by brushing teeth, licking lip or touching. Multiple attacks per day. Tolerating tecfidera .   UPDATE 01/04/16: Since last visit, doing about the same. No new sxs. Tolerating tecfidera .   UPDATE 07/07/15: No new issues. Tolerating meds. Overall doing well.  UPDATE 01/05/15: Since last visit, overall stable. Still notes some balance difficulty. He is trying to increase his walking back up to 2 miles per day. Tolerating meds. No new issues.  UPDATE 07/14/14 (LL): Patient failed to follow up for a year, was supposed to follow up in 3 months after last visit.  He did not want to do PT. He has been well on Tecfidera  with only flushing side effect. He presents today to have labs and get refills for his medication. Has fallen 2-3 times in the last year, no injuries. Complaints of bilateral finger numbness and paresthesias, slower cognitive processing time, fatigue. MMSE 28/30, AFT 18.  UPDATE 06/17/13: Patient presents for followup. Since last visit he has fallen 4 times including breaking his left foot, almost 4 months ago. Otherwise patient is stable. He still has issues with fatigue depression anxiety.  UPDATE 01/17/13: patient returns for urgent followup. Over past 3 weeks, patient has had increasing dizziness, balance difficulty, urination control problems. He feels some additional numbness or weakness in his legs. No difficulty with his hands or arms. No vision changes, slurred speech, trouble talking. Wife notes some more mood swings, irritability, depression, anxiety, attention and focus problems.  He also has had more difficulty with hearing.  UPDATE 09/30/12: Since last visit, doing well. Started tecfidera  on Jul 21, 2012. Initially flushing and GI sxs, but have eased up. No new MS flares or sxs.  UPDATE 06/19/12: S/p right CEA on 06/10/12. No complications. No new stroke/TIA symptoms. Will proceed with MS treatment.   UPDATE 05/01/12: Since last visit, had MRI brain, c and t spine, confirming chronic MS. On 04/08/12, had sudden onset dizziness, seeing floaters, and left face arm leg numbness x 10 minutes. Didn't tell anyone for 4 days. Had full recovery. Today carotid u/s shows right ICA stenosis (50-69%). Also with fatigue, malaise. Thinking about disability.  PRIOR HPI: 73 year old right-handed male with history of hypertension, hypercholesteremia, coronary artery disease, depression, anxiety, multiple sclerosis, TIA, here for evaluation and management of MS. In 1989, patient developed numbness spots in the back. He was evaluated by Dr. Porter ([facility]), with MRI and lumbar puncture, and given a diagnosis of MS. We do not have those records to review today. 2 years later he transferred care to Dr. Sheria [facility]), who treated him with prednisone . Over these years she developed some staggering, balance difficulty, vision problems. In 1995 he was started on Betaseron. Over the next 10-15 years he was on and off of Betaseron, do to fluctuation in  liver function testing. Patient last saw Dr. Sheria in 2011. He saw a different neurologist at [facility] in 2012. Since that time he has not seen a neurologist. His last MRI was around 2000 102,011, which is apparently stable. He weaned himself off of Betaseron in 2011 because he felt that his symptoms weren't fairly mild.  In 2013 he is started to develop increasing fatigue, balance difficulty, numbness in his hands and feet. Also having trouble with coordination.  REVIEW OF SYSTEMS: Full 14 system review of systems performed and negative  except: as per HPI.    ALLERGIES: No Known Allergies  HOME MEDICATIONS: Outpatient Medications Prior to Visit  Medication Sig Dispense Refill   baclofen  (LIORESAL ) 10 MG tablet Take 0.5-1 tablets (5-10 mg total) by mouth 2 (two) times daily. 30 each 3   buPROPion (WELLBUTRIN XL) 300 MG 24 hr tablet Take 200 mg by mouth daily.      Choline Fenofibrate (FENOFIBRIC ACID) 135 MG CPDR Take 1 capsule by mouth daily.   0   citalopram  (CELEXA ) 20 MG tablet Take 20 mg by mouth daily.  0   clopidogrel (PLAVIX) 75 MG tablet Take 75 mg by mouth daily.     HYDROcodone -acetaminophen  (NORCO/VICODIN) 5-325 MG tablet Take 1 tablet by mouth every 8 (eight) hours as needed for severe pain (pain score 7-10). 15 tablet 0   LORazepam  (ATIVAN ) 0.5 MG tablet Take 0.5 mg by mouth daily. nerves     metoprolol  (LOPRESSOR ) 50 MG tablet Take 25 mg by mouth 2 (two) times daily.      Multiple Vitamin (MULTIVITAMIN WITH MINERALS) TABS tablet Take 1 tablet by mouth daily.     nitroGLYCERIN  (NITROSTAT ) 0.4 MG SL tablet Place 1 tablet (0.4 mg total) under the tongue every 5 (five) minutes as needed for chest pain. 25 tablet 3   Omega-3 Fatty Acids (FISH OIL) 1000 MG CAPS Take 1 capsule by mouth 2 (two) times daily.      omeprazole (PRILOSEC) 20 MG capsule Take 20 mg by mouth daily.       predniSONE  (DELTASONE ) 10 MG tablet Take 60mg  on day 1. Reduce by 10mg  each subsequent day. (60, 50, 40, 30, 20, 10, stop) 21 tablet 0   rosuvastatin (CRESTOR) 20 MG tablet Take 40 mg by mouth daily.     VUMERITY  231 MG CPDR TAKE 2 CAPSULES BY MOUTH 2 (TWO) TIMES DAILY 120 capsule 0   carbamazepine  (TEGRETOL  XR) 200 MG 12 hr tablet Take 3 tablets (600 mg total) by mouth 2 (two) times daily. 540 tablet 4   gabapentin  (NEURONTIN ) 300 MG capsule Take 3 capsules (900 mg total) by mouth 3 (three) times daily. 810 capsule 4   No facility-administered medications prior to visit.    PAST MEDICAL HISTORY: Past Medical History:  Diagnosis Date    Anxiety    CAD (coronary artery disease)    had 2 arteries tented four years ago sees Dr. Jodine   Carotid artery occlusion    Depression    Diabetes mellitus without complication (HCC)    GERD (gastroesophageal reflux disease)    Headache(784.0)    migraines hx of   HLD (hyperlipidemia)    HTN (hypertension)    Sees Dr. Rosamond   MS (multiple sclerosis)    diagnosed in 1989. He was treated with prednisone  followed by betaseron which was gradually tapered because his LFTs went up and it resolved off therapy. Likely he is remained in remission for years   Neuromuscular disorder (  HCC)    has Multiple Sclerosis, sees Dr. Margaret   S/P right inguinal herniorrhaphy    Stroke [facility])    patient states has had several TIA's, last one 05/27/12   TIA (transient ischemic attack)    Trigeminal neuralgia     PAST SURGICAL HISTORY: Past Surgical History:  Procedure Laterality Date   CAROTID ENDARTERECTOMY     COLONOSCOPY  March 12, 2006   Had 2 polyps removed; one was an adenoma and the other one was hyperplastic   COLONOSCOPY N/A 10/09/2012   Procedure: COLONOSCOPY;  Surgeon: Claudis RAYMOND Rivet, MD;  Location: [facility];  Service: Endoscopy;  Laterality: N/A;  1030-rescheduled to 12:00pm per Jenkins   ENDARTERECTOMY  06/10/2012   Procedure: ENDARTERECTOMY CAROTID;  Surgeon: Krystal JULIANNA Doing, MD;  Location: [facility] OR;  Service: Vascular;  Laterality: Right;  With Dacron Patch Angioplasty   EYE SURGERY Right 10/2015   cataract   HERNIA REPAIR  age 89   HYDROCELE EXCISION  2011   stents     varicose removal from scrotum  1982   VASECTOMY  1989    FAMILY HISTORY: Family History  Problem Relation Age of Onset   Diabetes Mother    Hypertension Mother    Heart disease Mother        Before age 19   Hyperlipidemia Mother    Heart attack Father    Lung cancer Father    Cancer Father        Lung   Heart disease Father        Before age 49   Hypertension Son     SOCIAL HISTORY:  Social  History   Socioeconomic History   Marital status: Married    Spouse name: Mannie Ohlin   Number of children: 2   Years of education: 8yrs   Highest education level: Not on file  Occupational History    Employer: [employer]    Comment: [employer]  Tobacco Use   Smoking status: Former    Current packs/day: 0.00    Average packs/day: 2.0 packs/day for 25.2 years (50.5 ttl pk-yrs)    Types: Cigarettes    Start date: 05/23/1968    Quit date: 08/21/1993    Years since quitting: 30.8   Smokeless tobacco: Never  Substance and Sexual Activity   Alcohol use: No    Alcohol/week: 0.0 standard drinks of alcohol   Drug use: No   Sexual activity: Not on file  Other Topics Concern   Not on file  Social History Narrative   Full time. Married. Works at [employer].    Pt lives at home with his spouse.   Caffeine Use: none   Social Drivers of Corporate Investment Banker Strain: Low Risk  (04/28/2024)   Received from [facility]   Overall Financial Resource Strain (CARDIA)    How hard is it for you to pay for the very basics like food, housing, medical care, and heating?: Not hard at all  Food Insecurity: No Food Insecurity (06/07/2024)   Received from [facility]   Hunger Vital Sign    Within the past 12 months, you worried that your food would run out before you got the money to buy more.: Never true    Within the past 12 months, the food you bought just didn't last and you didn't have money to get more.: Never true  Transportation Needs: No Transportation Needs (06/07/2024)   Received from [facility]  PRAPARE - Transportation    In the past 12 months, has lack of transportation kept you from medical appointments or from getting medications?: No    In the past 12 months, has lack of transportation kept you from meetings, work, or from getting things needed for daily living?: No  Physical Activity: Insufficiently Active (04/28/2024)   Received from [facility]    Exercise Vital Sign    On average, how many days per week do you engage in moderate to strenuous exercise (like a brisk walk)?: 2 days    On average, how many minutes do you engage in exercise at this level?: 10 min  Stress: No Stress Concern Present (06/07/2024)   Received from [facility] - Occupational Stress Questionnaire    Do you feel stress - tense, restless, nervous, or anxious, or unable to sleep at night because your mind is troubled all the time - these days?: Not at all  Social Connections: Socially Integrated (04/28/2024)   Received from [facility]   Social Network    How would you rate your social network (family, work, friends)?: Good participation with social networks  Intimate Partner Violence: Not At Risk (06/06/2024)   Received from [facility]   HITS    Over the last 12 months how often did your partner physically hurt you?: Never    Over the last 12 months how often did your partner insult you or talk down to you?: Never    Over the last 12 months how often did your partner threaten you with physical harm?: Never    Over the last 12 months how often did your partner scream or curse at you?: Never     PHYSICAL EXAM  GENERAL EXAM: Patient is in no distress; well developed, nourished and groomed; neck is supple  VIDEO VISIT    DIAGNOSTIC DATA (LABS, IMAGING, TESTING) - I reviewed patient records, labs, notes, testing and imaging myself where available.  Lab Results  Component Value Date   WBC 4.9 04/16/2018   HGB 15.5 04/16/2018   HCT 44.2 04/16/2018   MCV 93 04/16/2018   PLT 165 04/16/2018      Component Value Date/Time   NA 137 04/16/2018 1352   K 4.6 04/16/2018 1352   CL 101 04/16/2018 1352   CO2 21 04/16/2018 1352   GLUCOSE 84 04/16/2018 1352   GLUCOSE 93 07/03/2015 1641   BUN 14 04/16/2018 1352   CREATININE 1.16 04/16/2018 1352   CALCIUM 9.9 04/16/2018 1352   PROT 7.4 04/16/2018 1352   ALBUMIN  4.8 04/16/2018 1352   AST 57 (H) 04/16/2018 1352   ALT 47 (H) 04/16/2018 1352   ALKPHOS 37 (L) 04/16/2018 1352   BILITOT 0.5 04/16/2018 1352   GFRNONAA 66 04/16/2018 1352   GFRAA 77 04/16/2018 1352   No results found for: CHOL, HDL, LDLCALC, LDLDIRECT, TRIG, CHOLHDL Lab Results  Component Value Date   HGBA1C 5.4 01/17/2013   Lab Results  Component Value Date   VITAMINB12 302 07/14/2014   Lab Results  Component Value Date   TSH 2.320 01/17/2013   Vit D, 25-Hydroxy  Date Value Ref Range Status  01/04/2016 31.8 30.0 - 100.0 ng/mL Final    Comment:    Vitamin D  deficiency has been defined by the Institute of Medicine and an Endocrine Society practice guideline as a level of serum 25-OH vitamin D  less than 20 ng/mL (1,2). The Endocrine Society went on to further define  vitamin D  insufficiency as a level between 21 and 29 ng/mL (2). 1. IOM (Institute of Medicine). 2010. Dietary reference    intakes for calcium and D. [place]: The    [employer]. 2. Holick MF, Binkley Baker, Bischoff-Ferrari HA, et al.    Evaluation, treatment, and prevention of vitamin D     deficiency: an Endocrine Society clinical practice    guideline. JCEM. 2011 Jul; 96(7):1911-30.    Lymphocytes Absolute  Date Value Ref Range Status  04/16/2018 0.9 0.7 - 3.1 x10E3/uL Final  01/04/2016 0.9 0.7 - 3.1 x10E3/uL Final  01/05/2015 0.8 0.7 - 3.1 x10E3/uL Final   04/02/2012 MRI brain IMPRESSION:  No acute infarct.   Moderate white matter type changes consistent with the patient's  history of multiple sclerosis have progressed slightly since the  2005 exam.  White matter type changes/MS plaques involve the  supratentorial region (most notable periventricular location) but  also seen within portions of the cerebellum and at the cervical  medullary junction (best seen on cervical spine MR).  None of these  areas demonstrate restricted motion or enhancement as can be seen  with  areas of active demyelination.   There is also an area of encephalomalacia right parietal -  occipital junction which was noted previously and has an appearance  more suggestive of remote infarct.   Global atrophy without hydrocephalus.   Diminutive size right vertebral artery may be congenitally small  possibly with superimposed atherosclerotic type changes and stable  in appearance.   04/02/2012 MRI thoracic spine IMPRESSION:   Regions of altered signal intensity within the thoracic cord  including; dorsal central T5 level, dorsal of T6 level, majority of  the cord at the T7 level and T9-10 level.  Small areas of  involvement of the ventral aspect of the cord at the T10 and T11  level not excluded.   None of these areas demonstrate enhancement to suggest active  demyelination.    04/02/2012 MRI C-spine IMPRESSION:   Areas of altered signal intensity consistent with MS plaques  involving portions of the cervical cord including; C2-3 level  dorsal central aspect, C3 level on the right, centrally at the C4  level, on the left at the C4-5 level, centrally and on the left at  the upper C5 level, on the left at the C5-6 level and on the left  at the C6 level.  None of these areas demonstrate enhancement as  may be seen with areas of active demyelination.   Cervical spondylotic changes most prominent C5-6 level as detailed  above.    02/06/13 MRI brain (with and without) demonstrating: 1. Multiple periventricular and subcortical chronic demyelinating plaques. Chronic plaque noted in the right medulla. 2. No acute plaques. 3. No change from MRI on 04/02/12.  02/06/13 MRI thoracic spine (with and without) demonstrating: 1. Multiple chronic demyelinating plaques at T5, T6, T7 and T9-10. 2. No acute plaques. 3. No change from MRI on 04/02/12.  10/09/16 MRI brain  1. Stable compared to 07/26/2015.  No explanation for new face pain. 2. Multiple sclerosis with stable plaque burden  and no signs of active demyelination. There is a plaque near the left trigeminal root entry zone, but stable. 3. Normal brain volume.  06/16/2022 MRI brain 1. Mild volume loss.  2. Stable moderately extensive bihemispheric white matter T2/FLAIR hyperintensities. These are nonspecific but compatible with previously noted stated clinical history of multiple sclerosis.  3. Stable small right parietal infarct.  4. No acute intracranial  abnormality detected. No new or enhancing brain lesion. No new brain parenchymal signal abnormality, enhancement abnormality, or evidence of acute infarction.   06/05/2023 MRI brain NO ACUTE INFARCTION.  SIMILAR SCATTERED BIHEMISPHERIC T2/FLAIR HYPERINTENSITIES WHICH ARE NONSPECIFIC BUT ABNORMAL WITH HISTORY OF MULTIPLE SCLEROSIS. NO EVIDENCE OF ENHANCING LESION TO SUGGEST ACTIVE DEMYELINATION.   01/06/15 JCV ab - 1.33 H positive  09/29/2023 MRI T-spine IMPRESSION:   1. MULTIPLE INTRAMEDULLARY CERVICAL CORD SIGNAL ABNORMALITIES CONSISTENT WITH DEMYELINATING LESIONS AS NOTED. NO ENHANCING CERVICAL CORD LESIONS ARE DETECTED.  There is abnormal intramedullary cervical cord signal abnormality at the C2-3, C4, C5, C5-6, C7, and C7-T1 levels, consistent with demyelinating lesions given stated clinical history of multiple sclerosis. No enhancing cervical cord lesions are detected.  2. MULTILEVEL CERVICAL DEGENERATIVE CHANGES AS DETAILED.   09/29/2023 MRI T-spine IMPRESSION:  1. Intramedullary cord signal abnormality at the T5 and T7 levels, consistent with demyelinating lesions given stated clinical history of multiple sclerosis. No enhancing cord lesions are detected.  2. No evidence of significant thoracic spinal canal stenosis or cord compression.        ASSESSMENT AND PLAN  73 y.o. year old male here with multiple sclerosis and prior TIA.  MS --> since 1989; on betaseron 1995-2000; on tecfidera  from 2013-2021, started on Vumerity  in 03/2020. He reports being  steroid intolerant in past (hallucinations, stomach irritation, weight gain).   TIA --> strokelike episode 05/2023 with slurred speech, work up negative, Hx of R brain TIA on 04/08/12, with right ICA stenosis, s/p right CEA 06/10/12.    Dx:  MULTIPLE SCLEROSIS, RELAPSING/REMITTING - Plan: gabapentin  (NEURONTIN ) 300 MG capsule  Trigeminal neuralgia  History of TIA (transient ischemic attack)  Left facial pain - Plan: carbamazepine  (TEGRETOL  XR) 200 MG 12 hr tablet, gabapentin  (NEURONTIN ) 300 MG capsule      PLAN:  MULTIPLE SCLEROSIS DISEASE MODIFYING THERAPY (stable) - continue vumerity  for multiple sclerosis - he questions ongoing need of mediation as he is 73 years old (as discussed with [facility] neurology) - will discuss with Dr. Margaret - annual CBC and CMP per PCP -MRI brain 05/2023 stable compared to 2018 imaging, no active lesions -MRI C-spine 09/2023 multiple intermedullary cervical cord signal abnormalities noted at C2-3, C4, C5, C5-6, C7 and C7-T1, no enhancing lesions detected  -MRI T-spine 09/2023 intramedullary cord signal abnormality at T5 and T7 without enhancing cord lesions    LEFT FACIAL PAIN / TRIGEMINAL NEURALGIA - recent flare, no being followed by [facility], awaiting MRI FIESTA imaging, possibly pursuing surgical intervention - continue CBZ 600mg  twice a day - continue gabapentin  600mg  twice a day   -Refills provided  STROKE PREVENTION - continue stroke prevention with clopidogrel and statin -Continue to follow with PCP for aggressive stroke risk factor management    Meds ordered this encounter  Medications   carbamazepine  (TEGRETOL  XR) 200 MG 12 hr tablet    Sig: Take 3 tablets (600 mg total) by mouth 2 (two) times daily.    Dispense:  540 tablet    Refill:  4   gabapentin  (NEURONTIN ) 300 MG capsule    Sig: Take 3 capsules (900 mg total) by mouth 3 (three) times daily.    Dispense:  810 capsule    Refill:  4     Follow up will be determined  based on further recommendations from Dr. Margaret regarding continuation vs stopping DMT     I personally spent a total of 25 minutes in the care of the patient today including preparing  to see the patient, counseling and educating, placing orders, referring and communicating with other health care professionals, and documenting clinical information in the EHR.   Harlene Bogaert, AGNP-BC  [facility] [address] [place], [place] [address]  Phone [phone] Fax [phone] Note: This document was prepared with digital dictation and possible smart phrase technology. Any transcriptional errors that result from this process are unintentional.

## 2024-07-04 NOTE — Telephone Encounter (Signed)
 Leita with [facility] called to follow up about Pt Refill Pt is out of medication and they wanted to follow up for PT   Diroximel Fumarate  (VUMERITY ) 231 MG CPDR   [facility] [place], [place] - [address] (Ph: [phone])  Order Details

## 2024-07-07 ENCOUNTER — Encounter: Payer: Self-pay | Admitting: Adult Health

## 2024-07-07 ENCOUNTER — Telehealth: Admitting: Adult Health

## 2024-07-07 DIAGNOSIS — G35D Multiple sclerosis, unspecified: Secondary | ICD-10-CM | POA: Diagnosis not present

## 2024-07-07 DIAGNOSIS — R519 Headache, unspecified: Secondary | ICD-10-CM | POA: Diagnosis not present

## 2024-07-07 DIAGNOSIS — Z8673 Personal history of transient ischemic attack (TIA), and cerebral infarction without residual deficits: Secondary | ICD-10-CM

## 2024-07-07 DIAGNOSIS — G500 Trigeminal neuralgia: Secondary | ICD-10-CM | POA: Diagnosis not present

## 2024-07-07 MED ORDER — GABAPENTIN 300 MG PO CAPS
900.0000 mg | ORAL_CAPSULE | Freq: Three times a day (TID) | ORAL | 4 refills | Status: AC
Start: 2024-07-07 — End: 2025-09-30

## 2024-07-07 MED ORDER — CARBAMAZEPINE ER 200 MG PO TB12
600.0000 mg | ORAL_TABLET | Freq: Two times a day (BID) | ORAL | 4 refills | Status: DC
Start: 2024-07-07 — End: 2025-09-30

## 2024-07-08 ENCOUNTER — Other Ambulatory Visit: Payer: Self-pay

## 2024-07-08 DIAGNOSIS — R519 Headache, unspecified: Secondary | ICD-10-CM

## 2024-07-08 MED ORDER — CARBAMAZEPINE ER 200 MG PO TB12: 600.0000 mg | ORAL_TABLET | Freq: Two times a day (BID) | ORAL | 4 refills | Status: AC

## 2024-07-08 NOTE — Progress Notes (Signed)
 Fax received from [facility] stating they will no longer be able to dispense this medication. Refills should be sent to patient's preferred local pharmacy. Rx sent to [facility] in chart.

## 2024-07-22 ENCOUNTER — Encounter: Payer: Self-pay | Admitting: Adult Health

## 2024-07-22 NOTE — Progress Notes (Signed)
 Okay, I will notify patient. Per initial [facility] consult, they advised patient to discuss ongoing need of Vumerity  given age. Patient is okay with staying on Vumerity  if that is what you recommended. I will let him know this can be discussed in a few years. Thank you.

## 2024-08-02 ENCOUNTER — Other Ambulatory Visit: Payer: Self-pay | Admitting: Adult Health

## 2024-08-02 DIAGNOSIS — G35D Multiple sclerosis, unspecified: Secondary | ICD-10-CM

## 2024-08-26 ENCOUNTER — Other Ambulatory Visit: Payer: Self-pay

## 2024-08-26 DIAGNOSIS — G35D Multiple sclerosis, unspecified: Secondary | ICD-10-CM

## 2024-08-26 DIAGNOSIS — R519 Headache, unspecified: Secondary | ICD-10-CM

## 2024-08-26 NOTE — Telephone Encounter (Signed)
 Refill placed for 1 year at recent visit. Please contact patient to see why he is requesting another refill?

## 2024-08-26 NOTE — Telephone Encounter (Signed)
 Going to decline request at this time as refill recently placed. Thank you.

## 2024-08-26 NOTE — Telephone Encounter (Signed)
 The initial request came from [facility], but the patient stated that he had just picked up a refill from [facility].  Patient was on the road and said he would call back.

## 2024-08-27 ENCOUNTER — Other Ambulatory Visit: Payer: Self-pay | Admitting: Adult Health

## 2024-08-27 DIAGNOSIS — G35D Multiple sclerosis, unspecified: Secondary | ICD-10-CM

## 2024-09-24 ENCOUNTER — Telehealth: Payer: Self-pay | Admitting: Adult Health

## 2024-09-24 NOTE — Telephone Encounter (Signed)
 Pt is requesting a refill for VUMERITY  231 MG CPDR .  Pharmacy: CPDR to [facility]

## 2024-09-25 ENCOUNTER — Other Ambulatory Visit: Payer: Self-pay

## 2024-09-25 DIAGNOSIS — G35D Multiple sclerosis, unspecified: Secondary | ICD-10-CM

## 2024-09-25 MED ORDER — VUMERITY 231 MG PO CPDR: 2.0000 | DELAYED_RELEASE_CAPSULE | Freq: Two times a day (BID) | ORAL | 2 refills | Status: AC

## 2024-09-25 NOTE — Telephone Encounter (Signed)
 Refill sent to pharmacy.
